# Patient Record
Sex: Female | Born: 1945 | Race: Black or African American | Hispanic: No | Marital: Single | Smoking: Former smoker
Health system: Southern US, Community
[De-identification: ages and names within clinical notes are randomized; demographics above are authoritative.]

## PROBLEM LIST (undated history)

## (undated) DIAGNOSIS — S065X9A Traumatic subdural hemorrhage with loss of consciousness of unspecified duration, initial encounter: Secondary | ICD-10-CM

## (undated) DIAGNOSIS — J9690 Respiratory failure, unspecified, unspecified whether with hypoxia or hypercapnia: Secondary | ICD-10-CM

## (undated) DIAGNOSIS — D649 Anemia, unspecified: Secondary | ICD-10-CM

## (undated) DIAGNOSIS — H269 Unspecified cataract: Secondary | ICD-10-CM

## (undated) DIAGNOSIS — N186 End stage renal disease: Secondary | ICD-10-CM

## (undated) DIAGNOSIS — IMO0002 Reserved for concepts with insufficient information to code with codable children: Secondary | ICD-10-CM

## (undated) DIAGNOSIS — B9562 Methicillin resistant Staphylococcus aureus infection as the cause of diseases classified elsewhere: Secondary | ICD-10-CM

## (undated) DIAGNOSIS — I472 Ventricular tachycardia, unspecified: Secondary | ICD-10-CM

## (undated) DIAGNOSIS — I10 Essential (primary) hypertension: Secondary | ICD-10-CM

## (undated) DIAGNOSIS — Z1635 Resistance to multiple antimicrobial drugs: Secondary | ICD-10-CM

## (undated) DIAGNOSIS — R7881 Bacteremia: Secondary | ICD-10-CM

## (undated) DIAGNOSIS — S065XAA Traumatic subdural hemorrhage with loss of consciousness status unknown, initial encounter: Secondary | ICD-10-CM

## (undated) DIAGNOSIS — A0472 Enterocolitis due to Clostridium difficile, not specified as recurrent: Secondary | ICD-10-CM

## (undated) DIAGNOSIS — I639 Cerebral infarction, unspecified: Secondary | ICD-10-CM

## (undated) DIAGNOSIS — N2581 Secondary hyperparathyroidism of renal origin: Secondary | ICD-10-CM

## (undated) DIAGNOSIS — G40909 Epilepsy, unspecified, not intractable, without status epilepticus: Secondary | ICD-10-CM

## (undated) DIAGNOSIS — A4902 Methicillin resistant Staphylococcus aureus infection, unspecified site: Secondary | ICD-10-CM

## (undated) DIAGNOSIS — G4731 Primary central sleep apnea: Secondary | ICD-10-CM

## (undated) HISTORY — PX: THYROIDECTOMY: SHX17

## (undated) HISTORY — PX: VASCULAR SURGERY: SHX849

## (undated) HISTORY — PX: CRANIOTOMY: SHX93

## (undated) HISTORY — PX: CHOLECYSTECTOMY: SHX55

## (undated) HISTORY — PX: EYE SURGERY: SHX253

---

## 2007-08-13 ENCOUNTER — Ambulatory Visit (HOSPITAL_COMMUNITY): Admission: RE | Admit: 2007-08-13 | Discharge: 2007-08-13 | Payer: Self-pay | Admitting: Nephrology

## 2007-09-13 ENCOUNTER — Inpatient Hospital Stay (HOSPITAL_COMMUNITY): Admission: EM | Admit: 2007-09-13 | Discharge: 2007-09-14 | Payer: Self-pay | Admitting: Emergency Medicine

## 2007-09-14 ENCOUNTER — Encounter: Payer: Self-pay | Admitting: Vascular Surgery

## 2007-09-14 ENCOUNTER — Ambulatory Visit: Payer: Self-pay | Admitting: Vascular Surgery

## 2007-09-19 ENCOUNTER — Ambulatory Visit: Payer: Self-pay | Admitting: Vascular Surgery

## 2007-09-26 ENCOUNTER — Ambulatory Visit: Payer: Self-pay | Admitting: Vascular Surgery

## 2007-10-30 ENCOUNTER — Emergency Department (HOSPITAL_COMMUNITY): Admission: EM | Admit: 2007-10-30 | Discharge: 2007-10-30 | Payer: Self-pay | Admitting: Emergency Medicine

## 2007-11-03 ENCOUNTER — Ambulatory Visit: Payer: Self-pay | Admitting: Surgery

## 2007-11-13 ENCOUNTER — Ambulatory Visit (HOSPITAL_COMMUNITY): Admission: RE | Admit: 2007-11-13 | Discharge: 2007-11-13 | Payer: Self-pay | Admitting: Surgery

## 2007-11-13 ENCOUNTER — Ambulatory Visit: Payer: Self-pay | Admitting: Surgery

## 2007-11-16 ENCOUNTER — Ambulatory Visit (HOSPITAL_COMMUNITY): Admission: EM | Admit: 2007-11-16 | Discharge: 2007-11-16 | Payer: Self-pay | Admitting: Emergency Medicine

## 2007-12-03 ENCOUNTER — Ambulatory Visit (HOSPITAL_COMMUNITY): Admission: RE | Admit: 2007-12-03 | Discharge: 2007-12-03 | Payer: Self-pay | Admitting: Vascular Surgery

## 2007-12-05 ENCOUNTER — Other Ambulatory Visit: Payer: Self-pay | Admitting: Emergency Medicine

## 2007-12-06 ENCOUNTER — Ambulatory Visit (HOSPITAL_COMMUNITY): Admission: RE | Admit: 2007-12-06 | Discharge: 2007-12-06 | Payer: Self-pay | Admitting: Vascular Surgery

## 2007-12-10 ENCOUNTER — Ambulatory Visit: Payer: Self-pay | Admitting: Vascular Surgery

## 2007-12-29 ENCOUNTER — Ambulatory Visit: Payer: Self-pay | Admitting: Surgery

## 2008-01-16 ENCOUNTER — Ambulatory Visit: Payer: Self-pay | Admitting: Surgery

## 2008-01-16 ENCOUNTER — Inpatient Hospital Stay (HOSPITAL_COMMUNITY): Admission: RE | Admit: 2008-01-16 | Discharge: 2008-01-17 | Payer: Self-pay | Admitting: Surgery

## 2008-01-30 ENCOUNTER — Ambulatory Visit: Payer: Self-pay | Admitting: Vascular Surgery

## 2008-03-26 ENCOUNTER — Ambulatory Visit (HOSPITAL_COMMUNITY): Admission: RE | Admit: 2008-03-26 | Discharge: 2008-03-26 | Payer: Self-pay | Admitting: Vascular Surgery

## 2008-03-26 ENCOUNTER — Ambulatory Visit: Payer: Self-pay | Admitting: Surgery

## 2009-01-19 ENCOUNTER — Ambulatory Visit (HOSPITAL_COMMUNITY): Admission: RE | Admit: 2009-01-19 | Discharge: 2009-01-19 | Payer: Self-pay | Admitting: Nephrology

## 2009-04-18 ENCOUNTER — Ambulatory Visit (HOSPITAL_COMMUNITY): Admission: RE | Admit: 2009-04-18 | Discharge: 2009-04-18 | Payer: Self-pay | Admitting: Nephrology

## 2009-07-12 IMAGING — XA IR PTA VENOUS
1 series · 12 of 24 positions shown · non-contrast
Comparison: No prior imaging studies for comparison.

CLINICAL DATA: Endstage renal disease.  The patient is dialyzed through a straight dialysis graft in the left upper arm.  She has developed significant left upper extremity edema and pain at the site of the dialysis graft. 
LEFT UPPER EXTREMITY DIALYSIS GRAFT AV SHUNTOGRAM:
CENTRAL VENOUS ANGIOPLASTY OF LEFT BRACHIOCEPHALIC VEIN ? 08/13/07:

[Series 1: run · 12 of 58 slices shown]
[im 3/58]
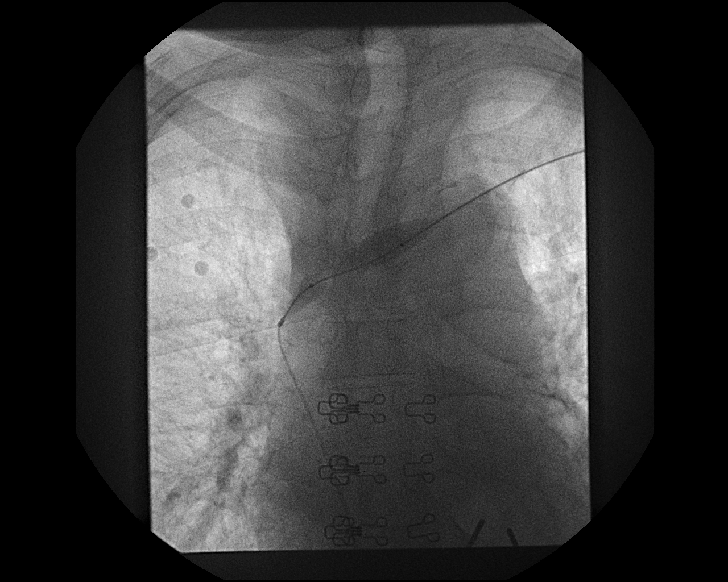
[im 8/58]
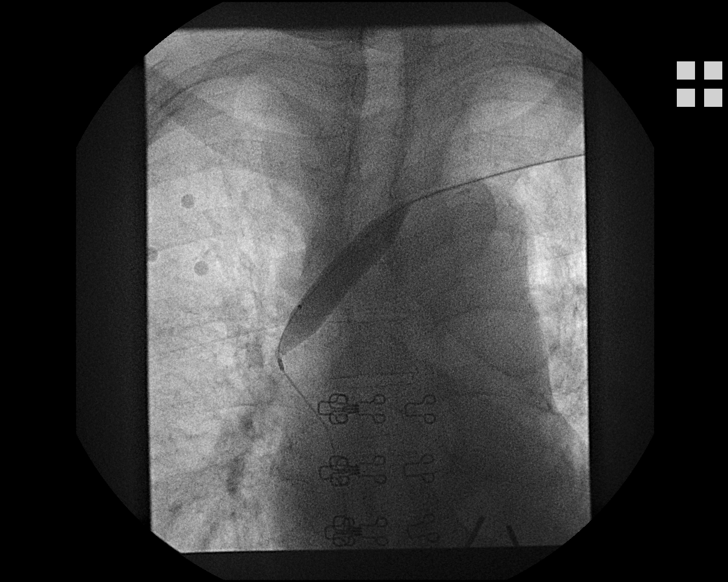
[im 13/58]
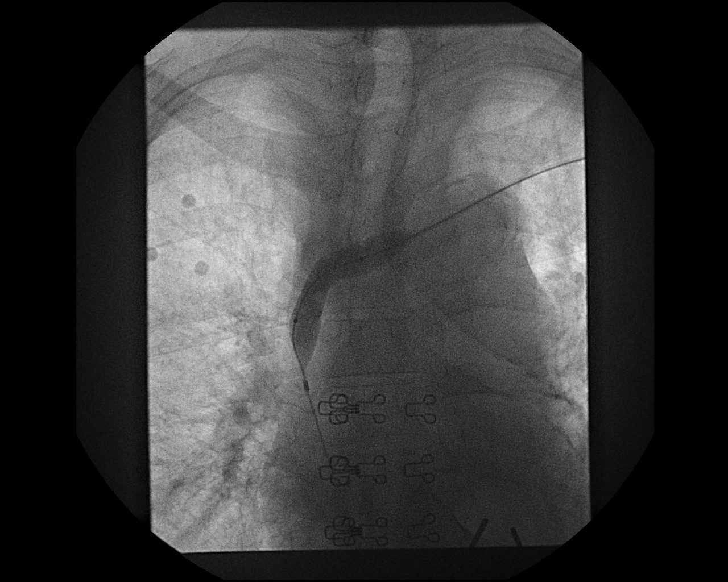
[im 18/58]
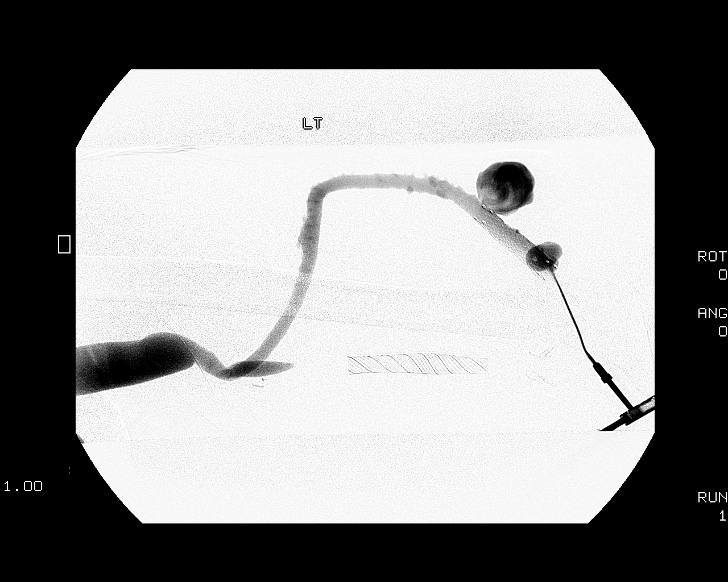
[im 23/58]
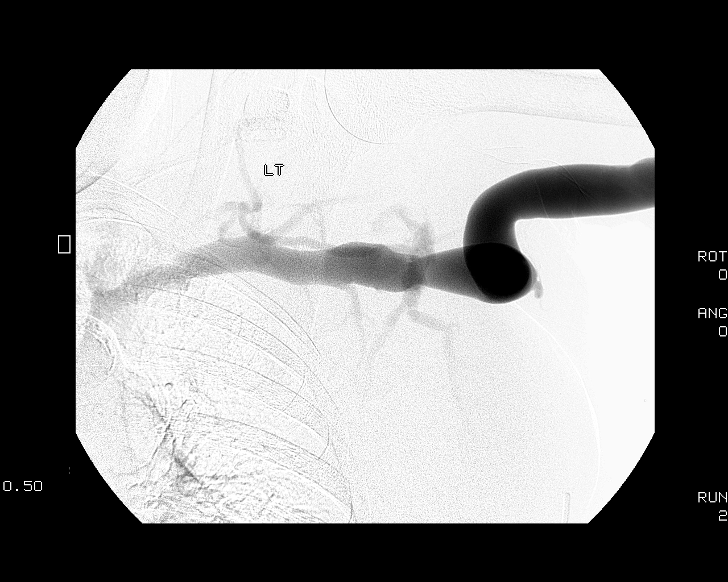
[im 28/58]
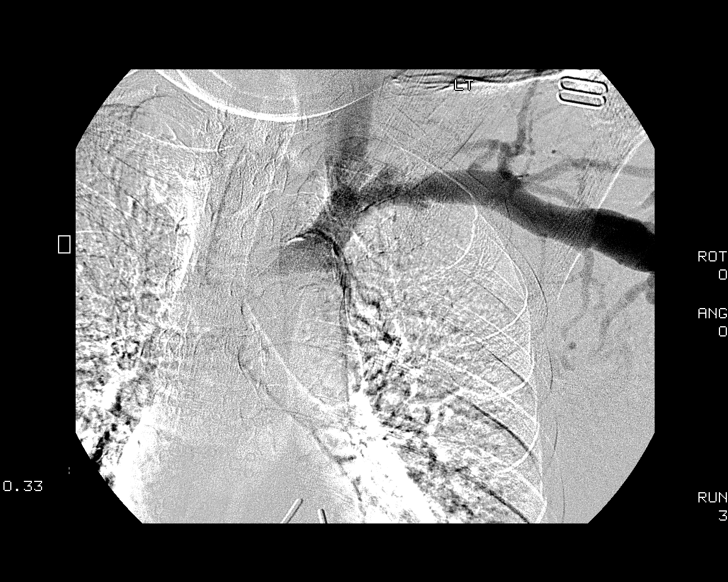
[im 33/58]
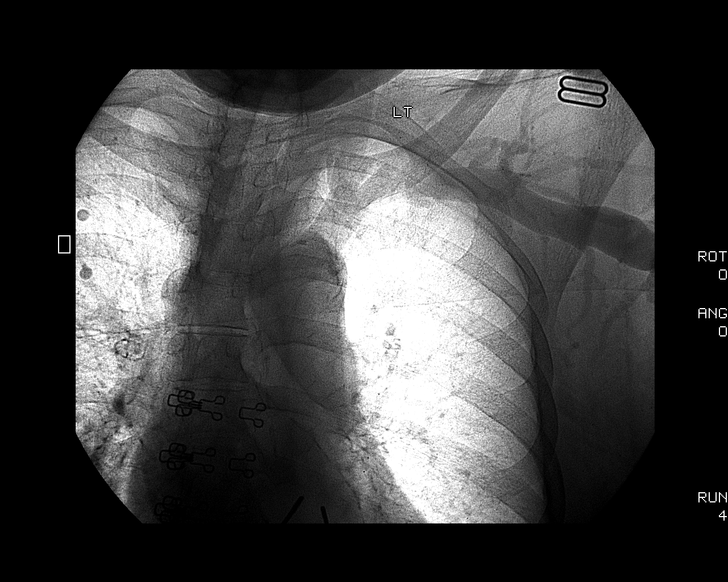
[im 38/58]
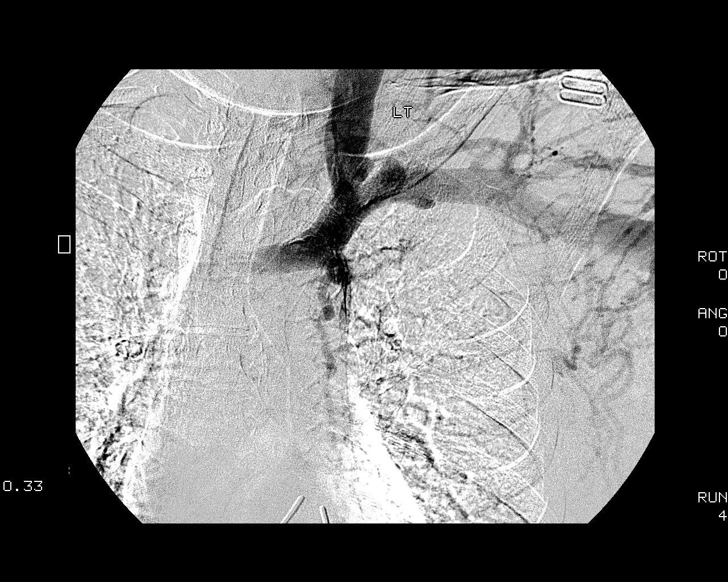
[im 43/58]
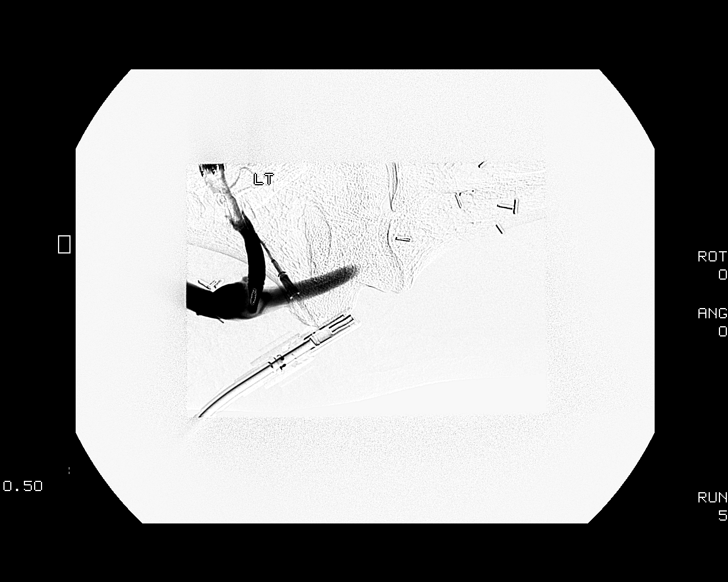
[im 48/58]
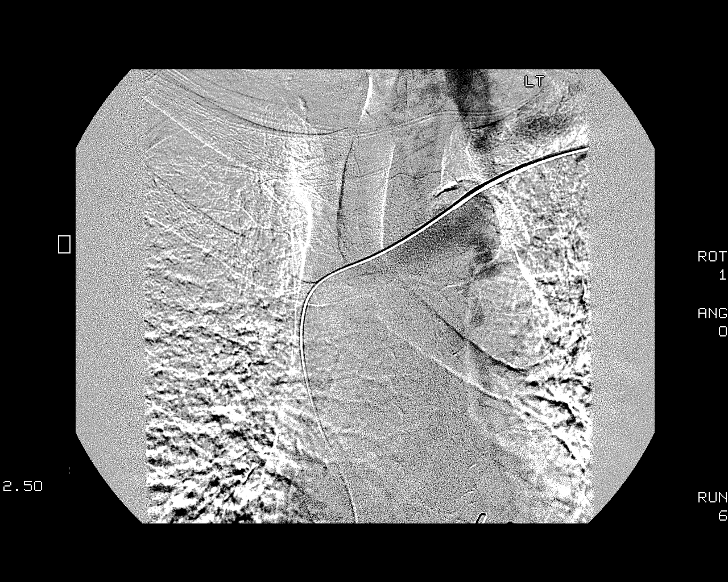
[im 53/58]
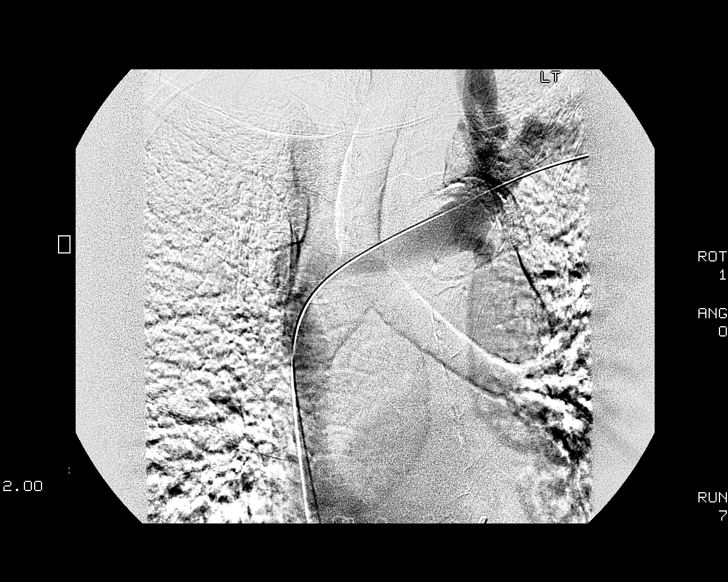
[im 58/58]
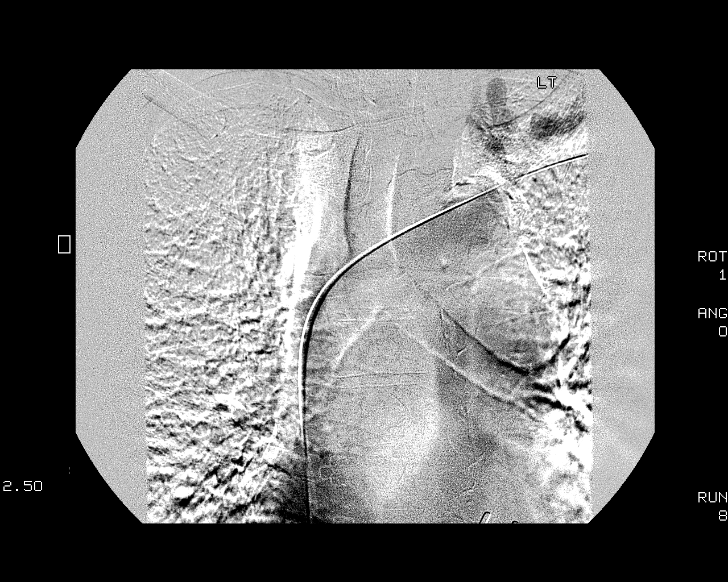

[12 of 24 positions shown; findings below may reference images not displayed]

Prior to the procedure, informed consent was obtained. 
Contrast:  95 cc Omnipaque 300. 
Fluoro time:  7.2 minutes. 
Medications:  0222 units intravenous heparin.
The skin overlying the left upper arm graft was sterilely prepped and draped.  Utilizing sterile technique, an 18-gauge angiocath was introduced.  Contrast shuntogram was performed with full visualization of venous outflow into the chest.  The arterial anastomosis was studied during temporary compression of the graft in the upper arm. 
After the diagnostic procedure, the angiocath was removed over a guidewire.  A 7-French vascular sheath was placed.  A diagnostic catheter was advanced into the chest.  A guidewire was then advanced through the brachiocephalic venous segment and into the SVC.  Balloon angioplasty was performed across the left brachiocephalic vein initially with a 12-mm x 4-cm Atlas balloon.  This was followed by additional angiography.
Additional balloon angioplasty was then performed with a 14-mm x 4-cm Atlas balloon.  A final angiography was then performed through the sheath. 
After the procedure, the sheath was removed and hemostasis obtained with application of an Ethilon pursestring suture. 
Complications:  None.
FINDINGS: The graft in the upper arm is patent.  After a normally patent arterial anastomosis, a short-segment self-expanded stent is present within the graft itself.  There is contrast filling of two focal pseudoaneurysms along the lower portion of the graft.  Venous anastomosis of the graft in the upper arm is widely patent. 
There is diffuse enlargement and tortuosity of the basilic vein in the upper arm as well as the axillary vein.  A number of collateral veins are seen emanating from the axillary vein and also traversing the chest.  Contrast initially flows in a retrograde fashion up the left internal jugular vein.  There is a level of occlusion of flow at the mid chest in the central portion of the left brachiocephalic vein; this had a subacute/subtotal appearance by initial angiography. 
Brachiocephalic venous occlusion was able to be crossed with a guidewire.  Balloon dilatation initially with the 12-mm balloon showed improved result with residual 50-60% stenosis remaining as well as some persistent reflux of contrast into the left jugular vein and collateral veins.
After further 14-mm balloon angioplasty, there was further improvement.  Final estimated central brachiocephalic vein stenosis is approximately 25-30%.  The SVC is normally patent.
IMPRESSION: The shunt study reveals a subacute occlusion of the left brachiocephalic vein in the midline.  This was able to be crossed and recanalized with the stenosis responding to both 12-mm and 14-mm angioplasty.  A mild residual 25-30% stenosis remains at the level of the left brachiocephalic vein.

## 2009-09-19 ENCOUNTER — Ambulatory Visit (HOSPITAL_COMMUNITY): Admission: RE | Admit: 2009-09-19 | Discharge: 2009-09-19 | Payer: Self-pay | Admitting: Nephrology

## 2009-09-28 IMAGING — CR DG CHEST 2V
2 series · 2 of 2 positions shown · non-contrast
Comparison: 09/13/2007.

CHEST - 2 VIEW
CLINICAL DATA: Nosebleed, pneumonia, short of breath.

[w chest pa]
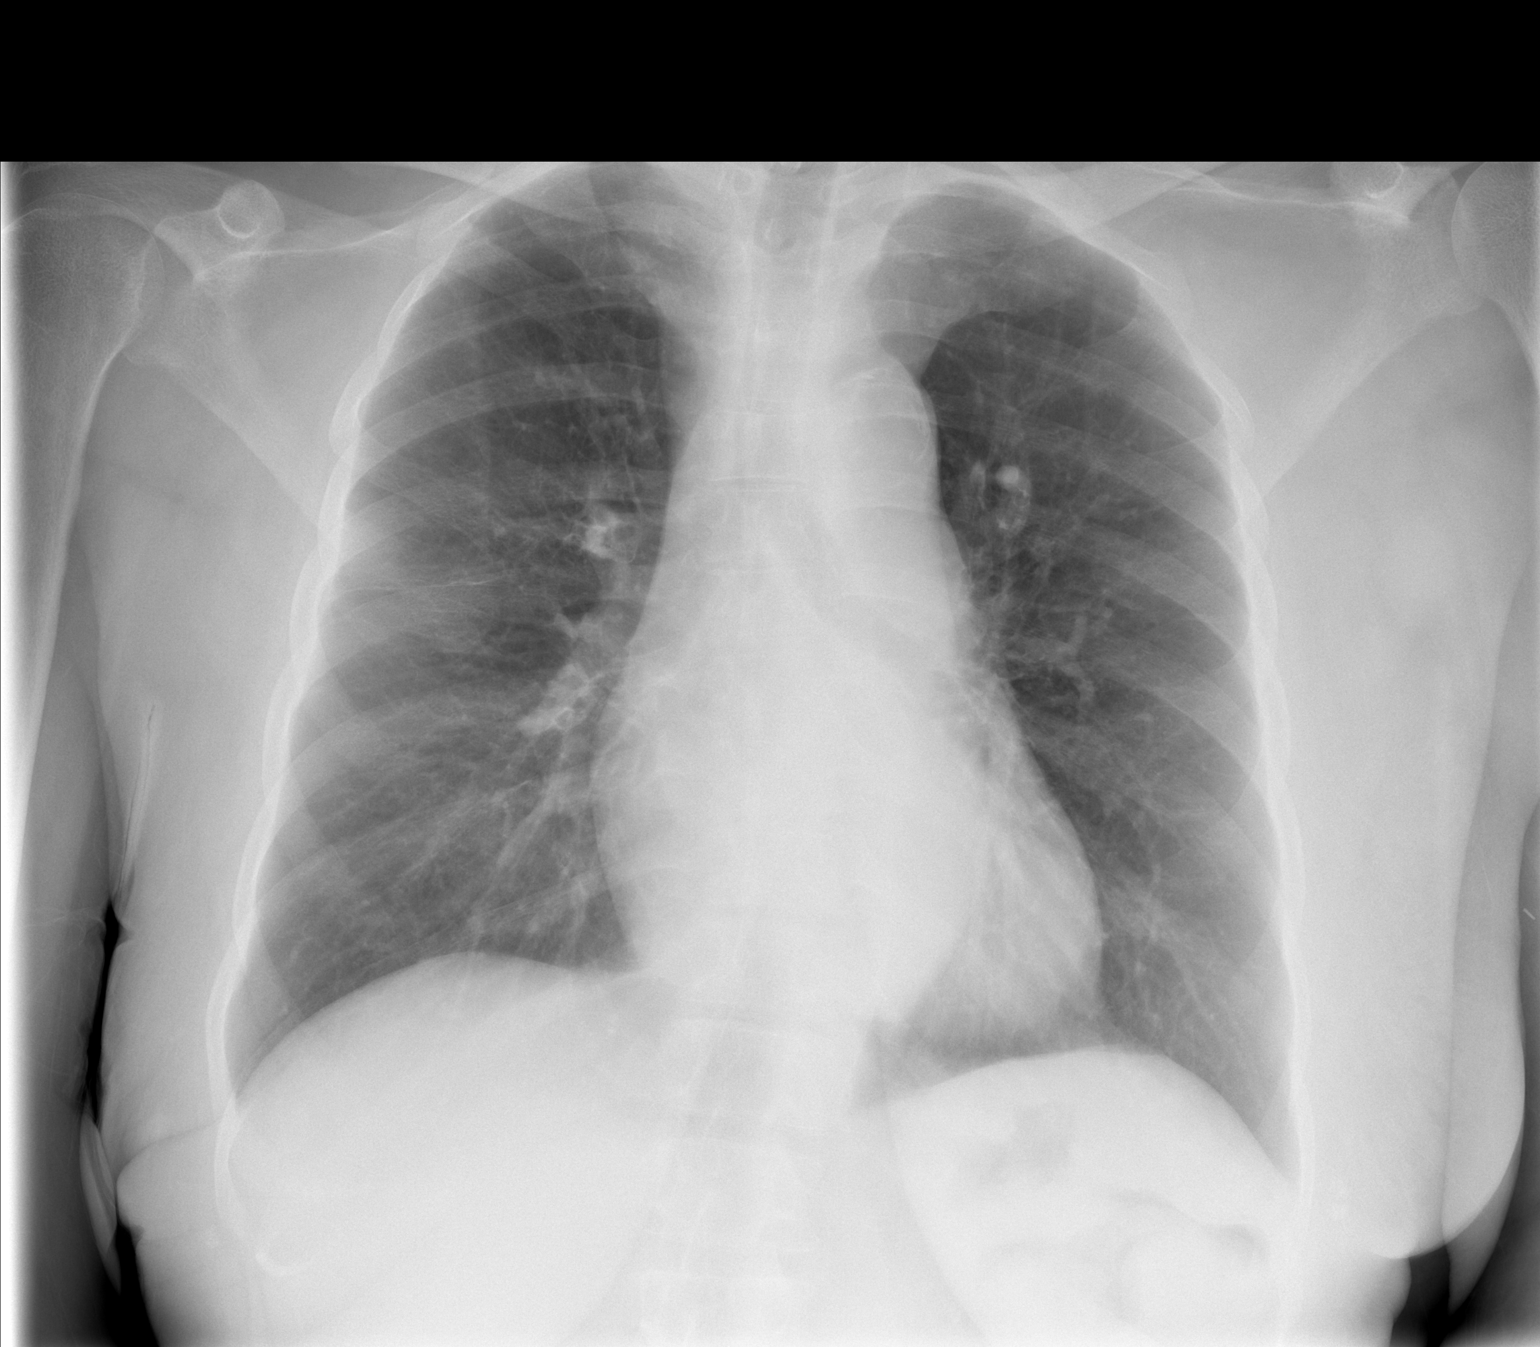

[w chest lat]
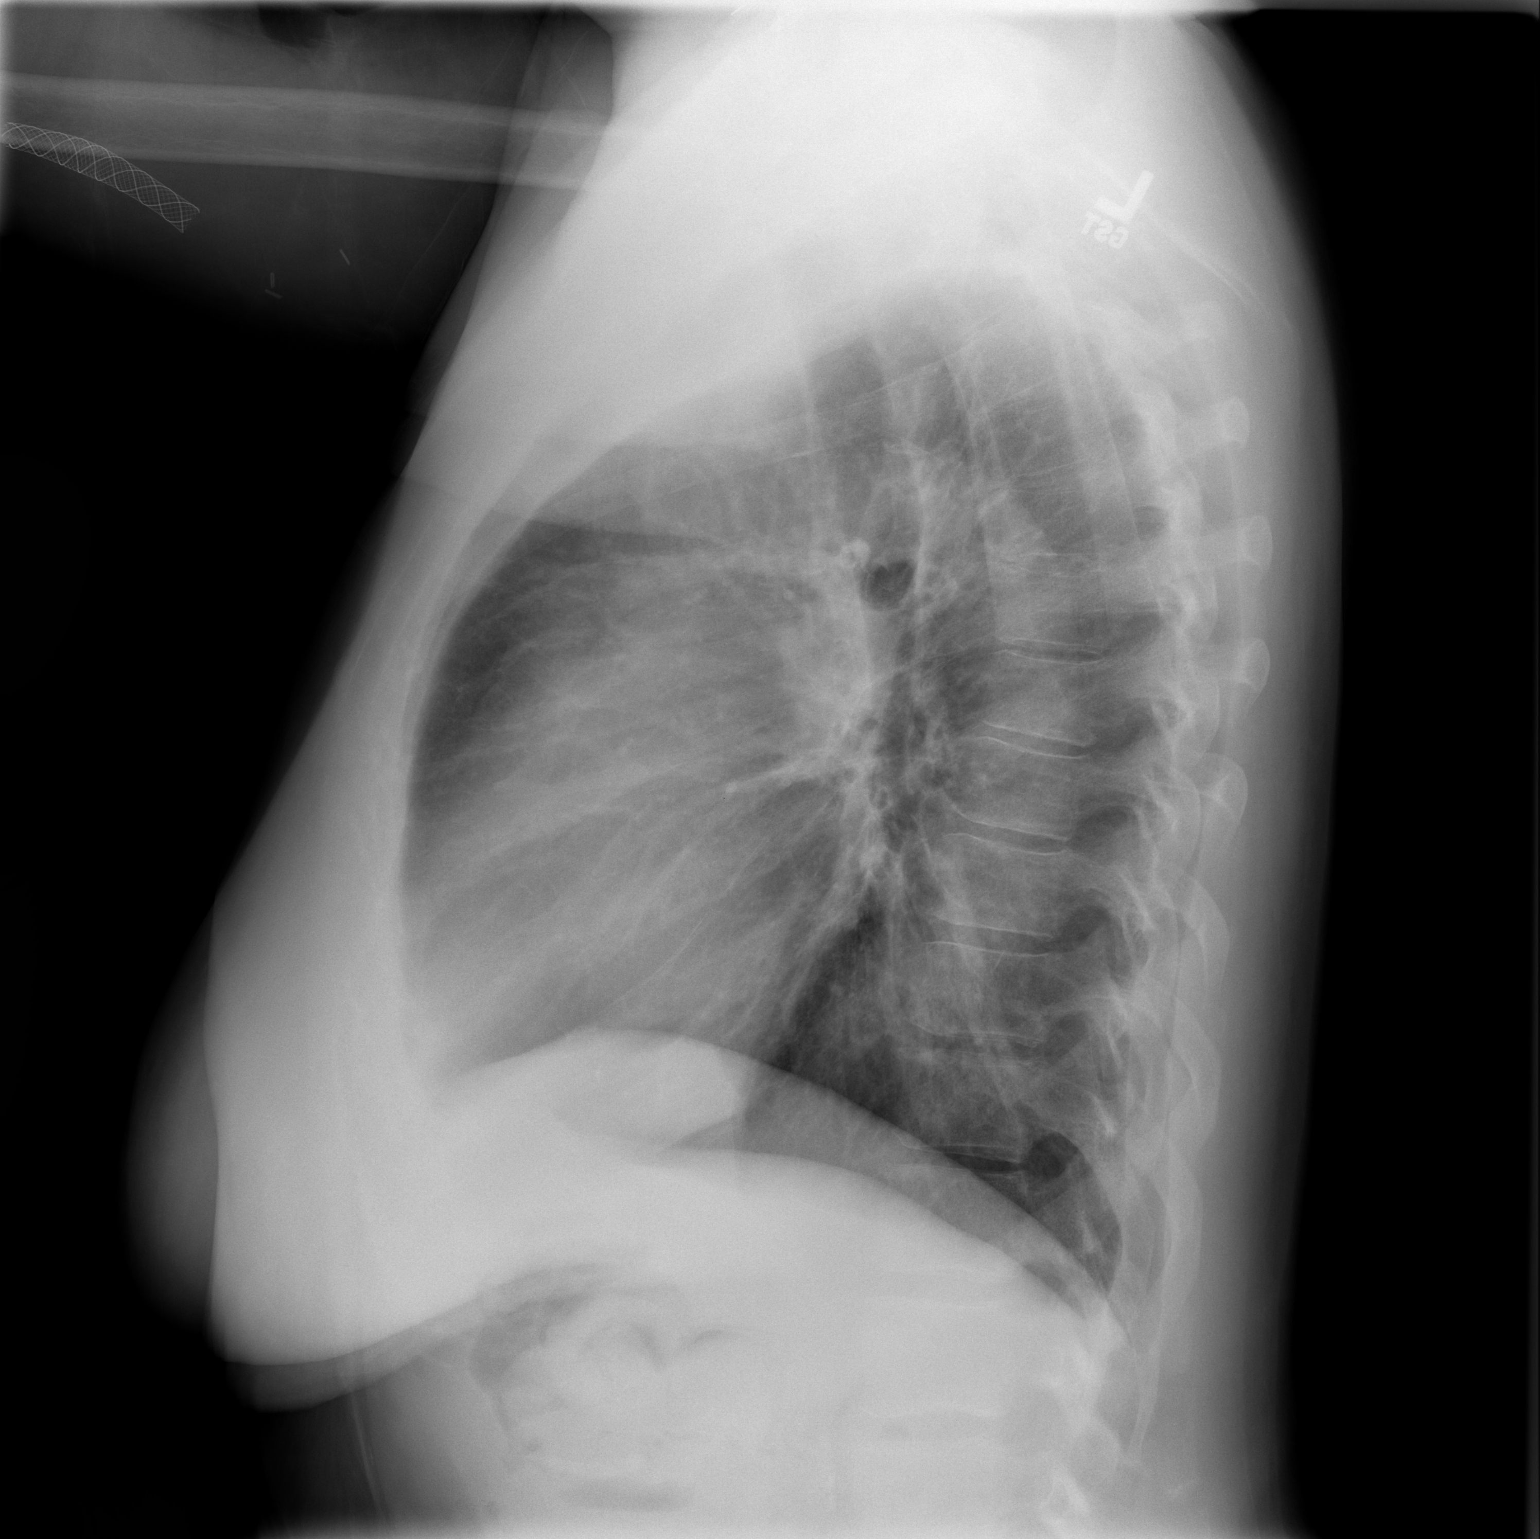

[2 of 2 positions shown; findings below may reference images not displayed]

FINDINGS: Cardiomediastinal contours within normal limits and unchanged. Aortic
arch atherosclerosis. No airspace disease or edema identified. No pleural
effusions.
**********************************************************************
IMPRESSION

1. No acute cardiopulmonary disease. Stable appearance of the chest. 
**********************************************************************

## 2010-11-22 ENCOUNTER — Ambulatory Visit (HOSPITAL_COMMUNITY)
Admission: RE | Admit: 2010-11-22 | Discharge: 2010-11-22 | Payer: Self-pay | Source: Home / Self Care | Attending: Nephrology | Admitting: Nephrology

## 2011-03-16 ENCOUNTER — Ambulatory Visit (INDEPENDENT_AMBULATORY_CARE_PROVIDER_SITE_OTHER): Payer: Medicare Other

## 2011-03-16 DIAGNOSIS — I724 Aneurysm of artery of lower extremity: Secondary | ICD-10-CM

## 2011-03-16 NOTE — H&P (Signed)
HISTORY AND PHYSICAL EXAMINATION  March 16, 2011  Re:  [name], [name]             DOB:  [birth year]  HISTORY OF PRESENT ILLNESS:  The patient is [name] 65 year old African American female with end-stage renal disease who dialyzes on Tuesday, Thursday, Saturday who presents for evaluation of possible pseudoaneurysm on her right thigh graft.  The patient was referred by Dr. Darrick Penna, her nephrologist.  She states that she began to notice [name] "lump" on her graft approximately 3 weeks ago.  She states this has enlarged in size over this time period.  Secondary to this Dr. Darrick Penna wished to have her evaluated.  They have continued to utilize the graft sticking it in other places.  Past medical history is significant for diabetes mellitus, hypertension and end-stage renal disease.  PAST MEDICAL HISTORY: 1. Type 2 diabetes mellitus noninsulin dependent. 2. Cerebrovascular accident x2. 3. End-stage renal disease. 4. Hypertension.  ALLERGIES:  Penicillin which causes hives and dizziness.  MEDICATIONS:  The patient did not bring [name] list of her medicines with her.  SOCIAL HISTORY:  The patient is single with 2 children.  She does not smoke and quit in 1996.  She does not drink alcohol.  FAMILY HISTORY: 1. Hypertension. 2. Diabetes mellitus.  REVIEW OF SYSTEMS:  The patient complains of some pain in her legs with walking and she is currently in [name] wheelchair.  She complains of occasional constipation and occasional headaches.  Complete review of systems is otherwise negative.  PHYSICAL EXAM:  General:  The patient is well-nourished, in no acute distress.  Vital signs:  O2 saturations 100% on room air, heart rate 78, respiratory rate is 12.  HEENT:  PERRLA.  EOMI.  Normocephalic, atraumatic.  Lungs:  Clear to auscultation.  Cardiovascular:  Regular rate and rhythm.  Abdomen:  Soft, nontender with active bowel sounds. Musculoskeletal:  No major deformities or cyanosis.   There is [name] right thigh graft in place with an excellent thrill.  There is also [name] pseudoaneurysm noted on the lateral portion of the graft near the proximal thigh.  This is pulsatile.  There is no evidence of skin breakdown or thinning.  The bilateral lower extremities are warm. Neurological:  No focal weakness or paresthesias.  Skin:  No ulcers or rashes are noted.  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  PLAN:  The patient was evaluated also by Dr. Imogene Burn who agrees the pseudoaneurysm should be resected.  The patient will be scheduled for revision of her right thigh AV graft with interposition graft on 03/28/2011 by Dr. Myra Gianotti.  The patient understands the procedure and agrees to proceed.  The patient and her daughter are informed that if she should have any complications including bleeding prior to scheduled procedure she is to:  Apply pressure directly over the site of bleeding consistently and call 911 and/or present to the emergency room for emergent evaluation.  The patient and daughter voiced understanding.  Pecola Leisure, [place]  Fransisco Hertz, MD Electronically Signed  AY/MEDQ  D:  03/16/2011  T:  03/16/2011  Job:  [id number]

## 2011-03-28 ENCOUNTER — Inpatient Hospital Stay (HOSPITAL_COMMUNITY): Admission: RE | Admit: 2011-03-28 | Payer: Medicare Other | Source: Ambulatory Visit | Admitting: Surgery

## 2011-03-30 ENCOUNTER — Inpatient Hospital Stay (HOSPITAL_COMMUNITY): Payer: Medicare Other

## 2011-03-30 ENCOUNTER — Inpatient Hospital Stay (HOSPITAL_COMMUNITY)
Admission: RE | Admit: 2011-03-30 | Discharge: 2011-03-30 | DRG: 264 | Disposition: A | Discharge status: Home / Self Care | Payer: Medicare Other | Source: Ambulatory Visit | Attending: Vascular Surgery | Admitting: Vascular Surgery

## 2011-03-30 DIAGNOSIS — I120 Hypertensive chronic kidney disease with stage 5 chronic kidney disease or end stage renal disease: Secondary | ICD-10-CM | POA: Diagnosis present

## 2011-03-30 DIAGNOSIS — N186 End stage renal disease: Secondary | ICD-10-CM | POA: Diagnosis present

## 2011-03-30 DIAGNOSIS — Z992 Dependence on renal dialysis: Secondary | ICD-10-CM

## 2011-03-30 DIAGNOSIS — Z8673 Personal history of transient ischemic attack (TIA), and cerebral infarction without residual deficits: Secondary | ICD-10-CM

## 2011-03-30 DIAGNOSIS — Y841 Kidney dialysis as the cause of abnormal reaction of the patient, or of later complication, without mention of misadventure at the time of the procedure: Secondary | ICD-10-CM | POA: Diagnosis present

## 2011-03-30 DIAGNOSIS — Z87891 Personal history of nicotine dependence: Secondary | ICD-10-CM

## 2011-03-30 DIAGNOSIS — T82898A Other specified complication of vascular prosthetic devices, implants and grafts, initial encounter: Secondary | ICD-10-CM

## 2011-03-30 LAB — POCT I-STAT 4, (NA,K, GLUC, HGB,HCT)
HCT: 39 % (ref 36.0–46.0)
Hemoglobin: 13.3 g/dL (ref 12.0–15.0)

## 2011-03-30 LAB — SURGICAL PCR SCREEN
MRSA, PCR: NEGATIVE
Staphylococcus aureus: NEGATIVE

## 2011-04-03 NOTE — Op Note (Signed)
  NAMEJERIS, [name]             ACCOUNT NO.:  [id number]  MEDICAL RECORD NO.:  [id number]           PATIENT TYPE:  I  LOCATION:  2899                         FACILITY:  [facility]  PHYSICIAN:  Di Kindle. Edilia Bo, M.D.DATE OF BIRTH:  04-30-1946  DATE OF PROCEDURE:  03/30/2011 DATE OF DISCHARGE:  03/30/2011                              OPERATIVE REPORT   PREOPERATIVE DIAGNOSIS:  Aneurysm of right thigh arteriovenous graft.  POSTOPERATIVE DIAGNOSIS:  Aneurysm of right thigh arteriovenous graft.  PROCEDURE:  Thrombectomy and revision of right thigh AV graft.  SURGEON:  Di Kindle. Edilia Bo, MD.  ASSISTANT:  Newton Pigg, PA.  ANESTHESIA:  General.  INDICATIONS:  This is a 65 year old woman who had an aneurysm along the arterial half of her graft.  She was brought in for revision given the risk of rupture.  DESCRIPTION OF PROCEDURE:  The patient received a general anesthetic. The right thigh was prepped and draped in usual sterile fashion.  I elected to bypass around the segment with the aneurysm and tunneled the graft medial to the old graft in order to allow easier access.  An oblique incision was made at the distal loop of the graft and then at the proximal arterial end of the graft and the graft at both ends was dissected free.  The patient was then heparinized after a tunnel was created between the two incisions and a 7-mm PTFE graft tunneled between the two incisions.  The graft was then clamped at both ends and divided. I passed a #4 Fogarty catheter in both directions.  A small amount organized clot was retrieved.  The old graft was then flushed with heparinized saline and clamped.  Both ends of the graft was spatulated and sewn end-to-end to the old graft, which was spatulated using continuous 6-0 Prolene suture.  At the completion, there was an excellent thrill in the graft.  Heparin was partially reversed with protamine.  The wounds were closed with deep  layer of 3-0 Vicryl and the skin closed with 4-0 Vicryl after hemostasis was obtained.  Sterile dressing was applied.  The patient tolerated the procedure well, was transferred to the recovery room in stable condition.  All needle and sponge counts were correct.     Di Kindle. Edilia Bo, M.D.     CSD/MEDQ  D:  03/30/2011  T:  03/31/2011  Job:  [id number]  Electronically Signed by Waverly Ferrari M.D. on 04/03/2011 04:52:57 PM

## 2011-04-10 NOTE — Assessment & Plan Note (Signed)
OFFICE VISIT   [name], [name]  DOB:  30-Dec-1945                                       11/03/2007  CHART#:[id number]   REASON FOR CONSULTATION:  Left arm swelling.   HISTORY:  This is a 65 year old African-American female with end-stage  renal disease secondary to presumably hypertension who has been on  dialysis for less than 3 years.  She has had most of her care in [place].  However, has recently moved to [place].  She comes in today with a 1  to 2 week history of face and left breast swelling.  She underwent  percutaneous intervention in September, which involved a balloon  angioplasty of her innominate vein using a 12-14 mm balloon.  She did  well initially.  However, has had recurrence.  She has been dialyzing  without difficulty.  She has been maintained on Coumadin reportedly to  keep her graft open.   REVIEW OF SYSTEMS:  GENERAL:  Has no weight gain or weight loss.  CARDIAC:  Positive for shortness of breath with exertion.  Negative for  chest pain.  PULMONARY:  Negative.  GASTROINTESTINAL:  Negative.  GENITOURINARY:  Kidney disease.  End-stage renal disease.  VASCULAR:  History of a stroke.  NEURO:  Negative.  ORTHO:  Negative.  PSYCH:  Negative.  ENT:  Poor eyesight, unable to be corrected by glasses.  HEMATOLOGICAL:  She is on Coumadin.   PAST MEDICAL HISTORY:  Positive for diabetes, which is diet-controlled,  and hypertension.  Stroke.   PAST SURGICAL HISTORY:  Cholecystectomy.  Cyst removal.  Left upper arm  graft and revision.   FAMILY HISTORY:  Positive for hypertension.   SOCIAL HISTORY:  Does not smoke.  Has a history of smoking.  Quit 6  years ago.  Does not drink.   MEDICATIONS:  Coumadin.  Lisinopril 20 mg twice daily.  Procardia 60 mg  twice daily.  Labetalol 300 mg twice daily.  Renagel 800 mg 3 with each  meal.  Multivitamin.  Reglan.   ALLERGIES:  Penicillin.   PHYSICAL EXAMINATION:  Vital signs:  Blood pressure  165/100, pulse 69,  respirations 18.  Cardiovascular is regular rate and rhythm.  In  general, she is well-appearing, in no acute distress in the wheelchair.  Left arm has palpable radial pulse.  There is a good thrill within the  graft.  Pulmonary, lungs are clear.  HEENT:  She is normocephalic,  atraumatic.  There is slight prominence in the neck and breast region  with engorgement.  No prominent collaterals are visualized.  Abdomen is  soft.  Extremities are warm and well-perfused.   DIAGNOSTIC STUDIES:  I reviewed her graft study from September.  She has  multiple stents within her graft, which is patent.  There is innominate  vein occlusion, which was successfully crossed and treated with  percutaneous balloon venoplasty.   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  Plan:  The patient's symptoms have returned  and for that reason, I feel that we should proceed with repeat graft  study with likely intervention of her innominate vein.  I discussed with  the family that should we find occlusion of her innominate vein and if  we are unable to cross this lesion and successfully recannulize it, we  would be looking  at ligation of her graft to control her symptoms.  If,  at the time of her procedure, I find that I am unable to open her  innominate vein, I would also perform a right-sided central venogram to  evaluate her for a new access on the right side.  Would also need to  discuss temporary placement of a catheter.  She has been told in the  past in [place] that she is not a candidate for a catheter in her neck  any more, and I want to find out the etiology of this.  This may also  impact where we locate her next graft.  I have scheduled her for a graft  study on Thursday, December 18.  She needs to a first case as she  dialyzes on Thursday at 12 noon.   Jorge Ny, MD  Electronically Signed   VWB/MEDQ  D:  11/03/2007  T:   11/04/2007  Job:  [id number]   cc:   Fayrene Fearing L. Deterding, M.D.

## 2011-04-10 NOTE — Op Note (Signed)
NAMESHARISA, [name]             ACCOUNT NO.:  [id number]   MEDICAL RECORD NO.:  [id number]          PATIENT TYPE:  AMB   LOCATION:  SDS                          FACILITY:  [facility]   PHYSICIAN:  Juleen China IV, MDDATE OF BIRTH:  October 23, 1946   DATE OF PROCEDURE:  11/13/2007  DATE OF DISCHARGE:  11/13/2007                               OPERATIVE REPORT   SURGEON:  1. Charlena Cross, M.D.   PREOPERATIVE DIAGNOSIS:  Central stenosis.   POSTOPERATIVE DIAGNOSIS:  Central stenosis.   PROCEDURE PERFORMED:  1. Ultrasound-guided venous access.  2. Dialysis graft study.  3. Angioplasty, innominate vein.   ANESTHESIA:  Local.   BLOOD LOSS:  Minimal.   FINDINGS:  Innominate vein stenosis treated with a 12 x 4 and 14 x 4  Atlas balloon.   HISTORY:  This is a 65 year old female who had dialyzed through her left  upper arm graft who presented with neck and left breast swelling.  She  has a history of innominate vein occlusion, which had been treated with  venoplasty in the past.  She comes in for diagnostic study.  Risks and  benefits were discussed and informed consent was signed.   PROCEDURE:  The patient was identified in the holding area and taken to  room #7.  She was placed supine on the table.  The left arm was prepped  and draped in standard sterile fashion; 1% lidocaine was used for local  anesthesia.  The left dialysis graft was evaluated with ultrasound.  This was found to be widely patent.  Using a micropuncture needle the  graft was accessed under ultrasound guidance.  The wire was advanced  centrally.  The micropuncture sheath was then placed.  Micropuncture  sheath contrast injections were performed including central venous  study.  This revealed a widely patent graft.  A stenosis within the  innominate vein was visualized.  Next, a Rosen wire was placed to the  micropuncture sheath, which was then removed and a 7-French short sheath  was then placed.  Over the  wire, a 12 x 4 Atlas balloon was advanced  across the lesion.  A Kumpe catheter was then used to direct the Rosen  wire into the heart.  The 12 x 4 balloon was then used to perform  balloon angioplasty going up to almost 20 atmospheres.  Followup study  revealed improvement, but residual stenosis.  Therefore, a 14 x 4  balloon was used to perform central venoplasty in the innominate vein.  This was taken to 20 atmospheres.  Followup study revealed  significant improvement in innominate vein stenosis.  At this point in  time, a decision was made to terminate the procedure.  The access site  was closed using the __________  technique.  The patient tolerated the  procedure well.  There were no complications.           ______________________________  V. Charlena Cross, MD  Electronically Signed     VWB/MEDQ  D:  11/13/2007  T:  11/13/2007  Job:  [id number]

## 2011-04-10 NOTE — Assessment & Plan Note (Signed)
OFFICE VISIT   [name], [name]  DOB:  January 12, 1946                                       09/26/2007  CHART#:[id number]   REASON FOR VISIT:  [name] presents today for continued followup  of her left AV graft.   HISTORY:  She had removal of an acute large false aneurysm with  replacement of interposition graft through this.  She has continued to  be dialyzed adequately through the upper portion of the upper arm graft.  Her incision is healing and her sutures were removed today.  She will  continue to use the upper portion of her graft and will see [place] again on  as needed basis.   Larina Earthly, M.D.  Electronically Signed   TFE/MEDQ  D:  09/26/2007  T:  09/30/2007  Job:  [id number]   cc:   [facility]

## 2011-04-10 NOTE — Discharge Summary (Signed)
[name], [name]             ACCOUNT NO.:  [id number]   MEDICAL RECORD NO.:  [id number]          PATIENT TYPE:  INP   LOCATION:  6736                         FACILITY:  [facility]   PHYSICIAN:  Juleen China IV, MDDATE OF BIRTH:  [birth year]   DATE OF ADMISSION:  01/16/2008  DATE OF DISCHARGE:  01/17/2008                               DISCHARGE SUMMARY   ADMISSION DIAGNOSIS:  Observation of insertion of a right thigh  arteriovenous Gore-Tex graft for end-stage renal disease.   DISCHARGE DIAGNOSIS:  Observation after insertion of a right thigh  arteriovenous Gore-Tex graft for end-stage renal disease.   SECONDARY DISCHARGE DIAGNOSES:  1. Type 2 diabetes.  2. Cerebrovascular accident x2.  3. End-stage renal disease.  4. Hypertension.   CONSULTS:  Renal on January 17, 2008.   PROCEDURE:  Insertion of a right thigh arteriovenous Gore-Tex graft by  Dr. Myra Gianotti on January 16, 2008.   HOSPITAL COURSE:  This is a 65 year old female who has known vein  stenosis.  She had a left-sided graft which ultimately failed and  recently underwent a right forearm loop graft.  This was complicated by  venous hypertension, requiring ligation and removal of the graft.  This  was done on December 06, 2007.  The patient has been dialyzed through a  left-sided catheter, and the patient continued to come back for further  follow-up.  The patient does have end-stage renal disease requiring  access for hemodialysis.  Dr. Myra Gianotti discussed with the patient and her  daughter about performing a right thigh graft.  He discussed risk of  steal as well as infection.  They are willing to proceed, but however  would like to inquire about peritoneal dialysis.  By Dr. Myra Gianotti  tentatively place the patient on the operating schedule for January 16, 2008.  The patient signed consent and went through with procedure  electively on January 16, 2008 by Dr. Juleen China.  The patient  tolerated this procedure  without any complications and was later  transferred to the PACU, where the patient continued to remain stable  throughout the evening, and then later transferred to a room, where she  remained stable throughout the night.  Renal was consulted at this time  to have the patient set up for hemodialysis in the morning.  The patient  would receive dialysis through a Diatek.  The patient was given  instructions regarding when her right thigh graft could be used for  access.  The patient agreed to this plan.  On January 17, 2008, postop  day #1, the patient was afebrile.  Vital signs were stable.  The  patient's lungs were clear.  She had a good thrill in her right thigh  graft.  Her right foot was warm and filling well, and there were no  signs of still syndrome.  There was also no hematoma.  Plan was for the  patient to be discharged home that day after hemodialysis.  The patient  agreed with this plan and was discharged to home on January 17, 2008,  and was stable.   DISCHARGE MEDICATIONS:  1. Procardia XL 60 mg b.i.d.  2. Labetalol 200 mg b.i.d.  3. Lisinopril 40 mg b.i.d.  4. Renagel 100 mg 3 tablets with meals.  5. Reglan 5 mg daily.  6. Norvasc 5 mg with meals.  7. Multivitamin daily.   DISCHARGE INSTRUCTIONS:  The patient was discharged on February 29,  2009.  The patient agreed and understood instructions that were  discussed with the patient.  The patient was to continue to follow a  renal diet.  Clean gently her wound with soap and water.  The patient  was to increase walking activity slowly and may shower on January 18, 2008.  The patient is also told to contact office or ER if increased  redness, drainage, especially pus from incision site, swelling, or fever  greater than 101, or any numbness in her foot.  The patient was to  return to Dr. Myra Gianotti if needed, and he could be contacted at [phone].  The patient is to continue medications per reconciliation sheet.  The   patient was also given a prescription for Percocet 5/325 mg 1-2 tablets  to take q.6 h. as needed for pain if needed.  Again, the patient agreed  to these instructions and was discharged to home on January 17, 2008  and was currently stable.      Cyndy Freeze, PA      V. Charlena Cross, MD  Electronically Signed    ALW/MEDQ  D:  02/02/2008  T:  02/03/2008  Job:  [id number]

## 2011-04-10 NOTE — Op Note (Signed)
NAMETAMANI, [name]             ACCOUNT NO.:  [id number]   MEDICAL RECORD NO.:  [id number]          PATIENT TYPE:  OBV   LOCATION:  2550                         FACILITY:  [facility]   PHYSICIAN:  Quita Skye. Hart Rochester, M.D.  DATE OF BIRTH:  1946/01/03   DATE OF PROCEDURE:  11/16/2007  DATE OF DISCHARGE:  11/16/2007                               OPERATIVE REPORT   PREOPERATIVE DIAGNOSIS:  End-stage renal disease.   POSTOPERATIVE DIAGNOSIS:  End-stage renal disease.   OPERATION:  1. Bilateral ultrasound localization of internal jugular veins.  2. Insertion of Diatek catheter via left internal jugular vein (28      cm).   SURGEON:  Quita Skye. Hart Rochester, M.D.   FIRST ASSISTANT:  Nurse.   ANESTHESIA:  Local.   PROCEDURE:  The patient was taken to the operating room and placed in  the supine position at which time the upper chest and neck were exposed.  Both internal jugular veins were imaged using B-mode ultrasound.  The  right IJ appeared to be occluded at the level of the clavicle but patent  higher in the neck.  The left side was widely patent.  There was normal  flow characteristics.  After prepping and draping in routine sterile  manner, the left internal jugular vein was entered using a  supraclavicular approach.  Guidewire passed into the right atrium with  some difficulty because of a known innominate stenosis, but it did  traverse this area with a regular guidewire.  This guidewire was  exchanged for an Amplatz Super Stiff wire through a small dilator which  traversed the stenosis, and after dilating the tract appropriately, a 28-  cm Diatek catheter was positioned in the right atrium, tunneled  peripherally, secured with nylon sutures, and the wound closed with  Vicryl in a subcuticular fashion.  Sterile dressing applied.  The  patient taken to recovery room in satisfactory condition.      Quita Skye Hart Rochester, M.D.  Electronically Signed     JDL/MEDQ  D:  11/16/2007  T:   11/17/2007  Job:  [id number]

## 2011-04-10 NOTE — Op Note (Signed)
[name], [name]             ACCOUNT NO.:  [id number]   MEDICAL RECORD NO.:  [id number]          PATIENT TYPE:  INP   LOCATION:  6736                         FACILITY:  [facility]   PHYSICIAN:  Juleen China IV, MDDATE OF BIRTH:  [birth year]   DATE OF PROCEDURE:  01/16/2008  DATE OF DISCHARGE:                               OPERATIVE REPORT   PREOPERATIVE DIAGNOSIS:  End-stage renal disease.   POSTOPERATIVE DIAGNOSIS:  End-stage renal disease.   PROCEDURE PERFORMED:  Right thigh graft.   TYPE OF ANESTHESIA:  General.   BLOOD LOSS:  50 cc.   DRAINS:  None.   CULTURES:  None.   SPECIMENS:  None.   DRESSINGS:  Dermabond.   DESCRIPTION OF PROCEDURE:  The patient was identified in the holding  area and taken to room 6.  She was placed supine on the table.  General  endotracheal anesthesia was administered.  The patient's right leg and  lower abdomen were prepped and draped in standard sterile fashion.  A  time-out was called, and perioperative antibiotics were administered.   A longitudinal incision was made at the level of the groin crease over  the palpable pulse of the femoral artery.  Bovie cautery was used to  dissect the subcutaneous tissue.  The femoral sheath was identified and  opened sharply.  Common femoral artery was identified.  I went down and  further mobilized the superficial femoral artery for an appropriate  distance.  Next, attention was turned towards the vein.  The saphenous  vein was identified and dissected down to the junction at the femoral  vein.  The femoral vein was then skeletonized proximal and distal to  this.  The vein and its major branches were encircled with vessel loops.  At this point in time, a subcutaneous tunnel was created, 2 counter  incisions were made.  A 6 x 50 Gore-Tex stretch graft was brought to the  tunnel.  The patient was then systemically heparinized.  Vascular clamps  were then used to occlude the superficial femoral  artery.  The artery  was opened with a #11 blade and extended with Potts scissors.  The graft  was beveled to fit the size of the arteriotomy.  An end-to-side  anastomosis was created using running 6-0 Prolene.  Prior to completion  of anastomosis, the artery was flushed in antegrade and retrograde  fashion.  The anastomosis was then secured.  The graft was evaluated and  found to have pulsatile flow.  The graft was flushed with heparinized  saline and then reoccluded.  Next, the saphenous femoral junction was  ligated.  The stump of the saphenous was oversewn with 5-0 Prolene  suture.  The saphenous femoral junction was then opened with Potts  scissors.  The graft was then beveled to fit the size of the venotomy .  An end-to-side anastomosis was performed using running 6-0 Prolene.  Prior to completion of the anastomosis, the graft as well as the vein  was flushed in antegrade and retrograde fashion.  Anastomosis was then  secured.  All clamps were released.  At this point  in time, there was a  good thrill within the graft.  Doppler was used to evaluate the femoral  artery, which had a good signal as well as femoral vein.  Next, the  patient was given protamine.  The wounds were then copiously irrigated.  The deep tissue was reapproximated with 3-0 Vicryl.  The skin was closed  with running 4-0 Vicryl.  Dermabond was placed.  The patient tolerated  the procedure and was taken recovery room in stable condition.           ______________________________  V. Charlena Cross, MD  Electronically Signed     VWB/MEDQ  D:  01/16/2008  T:  01/17/2008  Job:  [id number]

## 2011-04-10 NOTE — Assessment & Plan Note (Signed)
OFFICE VISIT   QUINTA, EIMER A  DOB:  August 04, 1946                                       01/30/2008  CHART#:[id number]   The patient presents today for evaluation of followup after a right  thigh AV GORE-TEX graft placement by Dr. Myra Gianotti on 01/16/2008.  She  does have mild swelling in her right leg as compared to her left leg,  and this is not progressive.  She does not have any calf tenderness.  Her thigh incisions are healing quite nicely, as is her groin incision.  She has a very small, less than 1 cm area of the incision that has a  slight separation.  This does not track and does not have any drainage.  She does have a good thrill in her graft.  I discussed this with the  patient and her family present.  I feel that she is healing quite  nicely, and should be able to use her graft in approximately 1  additional week.  She will see [place] on a p.r.n. basis.   Larina Earthly, M.D.  Electronically Signed   TFE/MEDQ  D:  01/30/2008  T:  02/02/2008  Job:  [id number]   cc:   Jorge Ny, MD  [facility]

## 2011-04-10 NOTE — Assessment & Plan Note (Signed)
OFFICE VISIT   [name], [name]  DOB:  Jun 27, 1946                                       12/10/2007  CHART#:[id number]   EXAM:  The right forearm and upper arm still has some edema, although  the patient states that this is improved.  She has [name] 2+ radial pulse.  She has several tape blisters on the medial aspect of the arm, but these  seem to be healing.  The incision is healing well and her sutures are  still in place.  She will follow up in 1 week's time for reevaluation of  her wound and consideration for suture removal at that time.  The hand  was warm and well-perfused.  Overall, she is improved.   [name]. Fields, MD  Electronically Signed   CEF/MEDQ  D:  12/10/2007  T:  12/11/2007  Job:  [id number]

## 2011-04-10 NOTE — Assessment & Plan Note (Signed)
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  She had been dialyzed in [place] and had all her prior  procedures done there.  She presented to the emergency room late  Saturday night on 09/13/07 and was taken to the operating room on Sunday  morning.  She had an expanding acute false aneurysm at the lower portion  of her graft.  This was cultured and gram stain showed no evidence of  organisms.  She had replacement of the lower half of her upper arm graft  with vortex.  She has an excellent thrill currently and she is still  using her upper half of the graft for access.  She has nylon mattress  sutures in place today with no evidence of infection.  She will continue  her usual dialysis.  We will see her again in one week for suture  removal.   Larina Earthly, M.D.  Electronically Signed   TFE/MEDQ  D:  09/19/2007  T:  09/23/2007  Job:  [id number]   cc:   Rudene Anda [facility]

## 2011-04-10 NOTE — Op Note (Signed)
NAMESILVERIA, BOTZ             ACCOUNT NO.:  [id number]   MEDICAL RECORD NO.:  [id number]          PATIENT TYPE:  AMB   LOCATION:  SDS                          FACILITY:  [facility]   PHYSICIAN:  Quita Skye. Hart Rochester, M.D.  DATE OF BIRTH:  1946/05/23   DATE OF PROCEDURE:  12/06/2007  DATE OF DISCHARGE:                               OPERATIVE REPORT   PREOPERATIVE DIAGNOSIS:  End-stage renal disease with severe venous  hypertension right upper extremity secondary to recently placed AV graft  right forearm.   POSTOPERATIVE DIAGNOSIS:  End-stage renal disease with severe venous  hypertension right upper extremity secondary to recently placed AV graft  right forearm.   OPERATION:  Removal of AV graft right forearm with vein patch  angioplasty right brachial artery.   SURGEON:  Quita Skye. Hart Rochester, M.D.   FIRST ASSISTANT:  Nurse.   ANESTHESIA:  General endotracheal.   CULTURES SENT:  Segment of Gore-Tex graft.   PROCEDURE IN DETAIL:  The patient was taken to the operating room and  placed in a supine position at which time satisfactory general  endotracheal anesthesia was administered.  The right arm was prepped  with Betadine scrub solution and draped in routine sterile manner.  Previous antecubital wound was reopened, graft having been placed 3 days  earlier and the arterial and venous anastomoses exposed.  There was  excellent flow through the graft and no evidence of any infection.  There was blistering of the skin secondary to edema with swelling from  the fingertips to the axilla.  The brachial artery was controlled  proximally and distally with clamps and the graft transected adjacent to  the anastomosis and the graft completely excised.  On the venous side  the cephalic vein was ligated proximally and transected and a piece of  the cephalic vein was preserved, opened longitudinally and sewn in as a  patch over the brachial artery with 6-0 Prolene.  Following appropriate  flushing  this was completed, clamps released and there was a good pulse  and good radial arterial flow at the wrist.  The graft having been  completely transected on both ends was then able to be pulled from the  tunnel from the arterial side without opening the distal wound.  There  was no purulent drainage or any evidence of infection of the graft  although a piece of the graft was sent for culture.  After thorough  irrigation the wound was closed in layers with interrupted Vicryl and  interrupted 4-0 nylon.  Sterile dressing applied.  The patient taken to  recovery room in satisfactory condition.      Quita Skye Hart Rochester, M.D.  Electronically Signed     JDL/MEDQ  D:  12/06/2007  T:  12/06/2007  Job:  [id number]

## 2011-04-10 NOTE — Op Note (Signed)
[name], [name]             ACCOUNT NO.:  [id number]   MEDICAL RECORD NO.:  [id number]          PATIENT TYPE:  INP   LOCATION:  5524                         FACILITY:  [facility]   PHYSICIAN:  Larina Earthly, M.D.    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   PROCEDURE:  Resection of false aneurysm, replacement with a new segment  of 6 mm standard wall Gore-Tex graft.   SURGEON:  Larina Earthly, M.D.   ASSISTANT:  Nurse.   ANESTHESIA:  LMA.   COMPLICATIONS:  None.   DISPOSITION:  To recovery room stable.   INDICATIONS FOR PROCEDURE:  The patient is a 65 year old black female  who presented to the emergency department the night prior to this  admission with a large false aneurysm in her left upper arm AV graft.  She reports this has been progressive over several days over the past  week.  She has not been treated before at [facility] and her family  with her said that she was treated in [facility] with  multiple revisions to this.  She is taken to the operative at this time  for exploration.  There was erythema and felt this may be infected and  if infected, would be removed, if not infected would be replaced.   PROCEDURE IN DETAIL:  The patient was taken to room, placed supine  position.  Area of the left arm was prepped and draped usual sterile  fashion.  Incision was made near the antecubital space the level of the  prior arterial anastomosis and the graft was exposed here for control.  This was occluded with a fistula clamp.  Next, incision was made over  the false aneurysm and entered the large false aneurysm sac.  Venous  backbleeding was controlled with direct pressure of the graft  above  this.  A large amount of mural thrombus was removed.  There was no pus  present.  The graft was flushed proximally and was occluded with a  fistula clamp.  Gram stain and C&S was sent and Gram stain showed no  organisms few white cells.  The aneurysm sac was debrided and bleeding  was controlled with cautery.  The graft was exposed and there was a  covered stent had been placed in the graft for prior treatment of false  aneurysm at this level at this office.  It was unsuccessful and there  had been continued breakdown of the graft at the false aneurysm  junction.  The graft was exposed further proximally.  The patient had  been being dialyzed successfully through the graft in the area above  this false aneurysm for the past week.  After this reason, decision was  made to replace this segment of the graft.  The new interposition graft  was brought  onto the field and was sewn end-to-end to the venous portion  of the graft, above the false aneurysm it was then tunneled down to the  level of the arterial anastomosis.  The graft was divided near the  arterial anastomosis and the new interposition graft was cut to the  appropriate length and sewn end-to-end to the old graft near the  arterial anastomosis with a running 6-0 Prolene suture.  Clamps were  removed and excellent thrill was noted.  The wound was irrigated with  saline.  Hemostasis with electrocautery.  The wound at the antecubital  space was closed with 3-0 Vicryl in subcutaneous and subcuticular  tissue.  The area of the false aneurysm was closed first with several  interrupted 3-0 Vicryl sutures to reapproximate subcutaneous tissue over  the graft.  The skin was then closed with 3-0 nylon mattress sutures.  Sterile dressing was applied.  The patient taken to recovery room stable  condition.      Larina Earthly, M.D.  Electronically Signed     TFE/MEDQ  D:  09/14/2007  T:  09/15/2007  Job:  [id number]

## 2011-04-10 NOTE — Op Note (Signed)
NAMEJAHNAE, [name]             ACCOUNT NO.:  [id number]   MEDICAL RECORD NO.:  [id number]          PATIENT TYPE:  AMB   LOCATION:  SDS                          FACILITY:  [facility]   PHYSICIAN:  Di Kindle. Edilia Bo, M.D.DATE OF BIRTH:  1946/04/06   DATE OF PROCEDURE:  12/03/2007  DATE OF DISCHARGE:                               OPERATIVE REPORT   PREOPERATIVE DIAGNOSIS:  Chronic renal failure.   POSTOPERATIVE DIAGNOSIS:  Chronic renal failure.   PROCEDURE:  Insertion of new right forearm loop arteriovenous graft (4-7  mm graft).   SURGEON:  Di Kindle. Edilia Bo, M.D.   ASSISTANT:  Jerold Coombe, P.A.   ANESTHESIA:  Local with sedation.   TECHNIQUE:  The patient was taken to the operating room and sedated by  anesthesia.  The right upper extremity was prepped and draped in the  usual sterile fashion.  After the skin was infiltrated with 1%  lidocaine, a transverse incision was made at the antecubital level and  here the brachial artery and adjacent brachial vein were dissected free.  The vein that was just lateral to the artery was the largest of the  available veins.  Using one distal counterincision, a 4-7 mm graft was  tunneled in a loop fashion to the forearm with the venous aspect of the  graft along the radial aspect of the forearm.  The patient was then  heparinized.  The brachial artery was clamped proximally and distally  and a longitudinal arteriotomy was made.  A segment of the 4-mm end of  the graft was fixed excised and the graft slightly spatulated and sewn  end-to-side to the artery using continuous 6-0 Prolene suture.  The  graft was then pulled to the appropriate length for anastomosis to the  brachial vein.  The vein had been ligated distally and spatulated  proximally.  It  was cut to the appropriate length, spatulated sewn end-  to-end to the vein using continuous 6-0 Prolene suture.  Of note, the  vein did take the 4.5-mm dilator.  It probably  would have taken a 5 but  I did not want to stretch the vein and injure the intima.  At the  completion there was an excellent thrill in the graft had a palpable  radial pulse.  Hemostasis was obtained in the wounds.  The wounds were  closed with a deep layer of 3-0 Vicryl and the skin closed with 4-0  Vicryl.  A sterile dressing was applied.  The patient tolerated the  procedure well, was transferred to the recovery room in satisfactory  condition.  All needle and sponge counts were correct.      Di Kindle. Edilia Bo, M.D.  Electronically Signed     CSD/MEDQ  D:  12/03/2007  T:  12/03/2007  Job:  [id number]

## 2011-04-10 NOTE — Assessment & Plan Note (Signed)
OFFICE VISIT   GENESYS, COGGESHALL A  DOB:  Dec 17, 1945                                       12/29/2007  CHART#:[id number]   REASON FOR VISIT:  Dialysis access.   HISTORY:  This is a 65 year old female who has known innominate vein  stenosis.  She had a left-sided graft, which ultimately failed and  recently underwent a right forearm loop graft.  This was complicated by  venous hypertension requiring ligation and removal of the graft.  This  was done on December 06, 2007.  The patient has been dialyzed through a  left-sided catheter.  She comes back in for further followup.   PHYSICAL EXAMINATION:  Blood pressure is 160/80, pulse 52, respirations  18, O2 saturations 100%.  In general, she is in a wheelchair, in no  acute distress.  Cardiovascular is regular rate and rhythm.  Respirations not labored.  Abdomen is soft.  Her extremities are warm  and well-perfused.  She has a palpable right femoral pulse, as well as a  palpable right dorsalis pedis pulse.   ASSESSMENT AND PLAN:  End-stage renal disease.   PLAN:  I discussed with the patient and her daughter performing a right  thigh graft.  We discussed risks of steal as well as infection.  They  are willing to proceed.  However, would like to enquire about peritoneal  dialysis.  I tentatively placed the patient on the operating room  schedule for February 20.   Jorge Ny, MD  Electronically Signed   VWB/MEDQ  D:  12/29/2007  T:  12/30/2007  Job:  [id number]   cc:   [facility]

## 2011-07-05 NOTE — Discharge Summary (Signed)
  [name], BACK             ACCOUNT NO.:  [id number]  MEDICAL RECORD NO.:  [id number]  LOCATION:  2899                         FACILITY:  [facility]  PHYSICIAN:  Di Kindle. Edilia Bo, M.D.DATE OF BIRTH:  June 28, 1946  DATE OF ADMISSION:  03/30/2011 DATE OF DISCHARGE:  03/30/2011                              DISCHARGE SUMMARY   Please note in reviewing her E-chart records, there was no evidence that this patient was admitted; however, it listed under inpatient and therefore, I have been asked repeatedly to dictate a discharge summary. I am therefore attempting to dictate a discharge summary based on the very limited information that is available from her access anywhere in E- chart.  REASON FOR ADMISSION:  Aneurysm of right thigh AV graft.  HISTORY:  This is a 65 year old woman with an aneurysm along the arterial half of her right thigh AV graft.  She was brought in for revision of the graft because of the risk of rupture.  HOSPITAL COURSE:  The patient was admitted on Mar 30, 2011.  She underwent thrombectomy and revision of her right thigh AV graft by bypassing around the aneurysmal segment.  The patient was monitored in the recovery room postoperatively and discharged to home from the recovery room through Short-Stay.  No complications were noted.  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  PROCEDURES:  Thrombectomy and revision of right thigh AV graft on Mar 30, 2011.  CONDITION ON DISCHARGE:  Good.  DISPOSITION:  Discharge is to home.  DISCHARGE MEDICATIONS: 1. Aspirin 81 mg p.o. daily. 2. Renal vitamin one p.o. q.a.m. 3. Renagel 800 mg p.o. two capsules with meals and snacks. 4. Sensipar 60 mg two tablets with supper. 5. Lisinopril 40 mg p.o. nightly.  PERTINENT LABORATORY STUDIES:  Potassium is 3.7.     Di Kindle. Edilia Bo, M.D.     CSD/MEDQ  D:  07/04/2011  T:  07/05/2011  Job:  [id number]  Electronically  Signed by Waverly Ferrari M.D. on 07/05/2011 03:42:15 PM

## 2011-08-16 LAB — POCT I-STAT 4, (NA,K, GLUC, HGB,HCT)
Glucose, Bld: 74
HCT: 45
Hemoglobin: 13.6
Operator id: 153981
Potassium: 4.9
Potassium: 5.1
Sodium: 131 — ABNORMAL LOW
Sodium: 135

## 2011-08-16 LAB — BASIC METABOLIC PANEL
BUN: 34 — ABNORMAL HIGH
CO2: 29
Calcium: 8.9
Chloride: 91 — ABNORMAL LOW
Creatinine, Ser: 5.79 — ABNORMAL HIGH
Glucose, Bld: 133 — ABNORMAL HIGH

## 2011-08-16 LAB — ANAEROBIC CULTURE

## 2011-08-16 LAB — DIFFERENTIAL
Basophils Absolute: 0.1
Basophils Relative: 2 — ABNORMAL HIGH
Eosinophils Absolute: 0.3
Lymphocytes Relative: 7 — ABNORMAL LOW
Monocytes Absolute: 0.4
Neutrophils Relative %: 80 — ABNORMAL HIGH

## 2011-08-16 LAB — PROTIME-INR: Prothrombin Time: 13.3

## 2011-08-16 LAB — WOUND CULTURE: Culture: NO GROWTH

## 2011-08-16 LAB — CBC
MCHC: 32.5
MCV: 91.4
RDW: 16.3 — ABNORMAL HIGH

## 2011-08-17 LAB — RENAL FUNCTION PANEL
Albumin: 3.1 — ABNORMAL LOW
CO2: 27
CO2: 28
CO2: 31
Chloride: 100
Chloride: 102
Chloride: 103
GFR calc Af Amer: 10 — ABNORMAL LOW
GFR calc Af Amer: 8 — ABNORMAL LOW
GFR calc non Af Amer: 7 — ABNORMAL LOW
GFR calc non Af Amer: 8 — ABNORMAL LOW
Glucose, Bld: 81
Glucose, Bld: 87
Phosphorus: 5.1 — ABNORMAL HIGH
Potassium: 4.7
Potassium: 5.4 — ABNORMAL HIGH
Sodium: 139
Sodium: 141
Sodium: 142

## 2011-08-17 LAB — POCT I-STAT 4, (NA,K, GLUC, HGB,HCT)
Glucose, Bld: 74
HCT: 43
Hemoglobin: 14.6
Hemoglobin: 16 — ABNORMAL HIGH
Operator id: 206361
Potassium: 5.2 — ABNORMAL HIGH
Sodium: 136
Sodium: 136

## 2011-08-17 LAB — PROTIME-INR: Prothrombin Time: 12.4

## 2011-08-17 LAB — CBC
HCT: 34 — ABNORMAL LOW
Hemoglobin: 11 — ABNORMAL LOW
RBC: 3.72 — ABNORMAL LOW
RDW: 17.9 — ABNORMAL HIGH
WBC: 6.2

## 2011-08-31 LAB — COMPREHENSIVE METABOLIC PANEL
ALT: 10
AST: 18
CO2: 27
Chloride: 97
Creatinine, Ser: 6.66 — ABNORMAL HIGH
GFR calc Af Amer: 8 — ABNORMAL LOW
GFR calc non Af Amer: 6 — ABNORMAL LOW
Glucose, Bld: 80
Total Bilirubin: 1.1

## 2011-08-31 LAB — POCT I-STAT 4, (NA,K, GLUC, HGB,HCT)
Glucose, Bld: 71
HCT: 41

## 2011-08-31 LAB — APTT: aPTT: 31

## 2011-08-31 LAB — CBC
Hemoglobin: 12.3
MCV: 90.2
RBC: 4.1
WBC: 4

## 2011-09-03 LAB — I-STAT 8, (EC8 V) (CONVERTED LAB)
Bicarbonate: 27.7 — ABNORMAL HIGH
HCT: 41
Hemoglobin: 13.9
Operator id: 270111
Sodium: 136
TCO2: 29

## 2011-09-05 LAB — CBC
HCT: 45.8
Hemoglobin: 14.8
MCHC: 32.3
MCV: 90.3
RDW: 17.7 — ABNORMAL HIGH

## 2011-09-05 LAB — COMPREHENSIVE METABOLIC PANEL
Alkaline Phosphatase: 64
BUN: 10
Calcium: 10
Glucose, Bld: 92
Potassium: 3.4 — ABNORMAL LOW
Total Protein: 7.4

## 2011-09-05 LAB — WOUND CULTURE: Culture: NO GROWTH

## 2011-09-05 LAB — APTT: aPTT: 34

## 2011-09-05 LAB — CULTURE, BLOOD (ROUTINE X 2): Culture: NO GROWTH

## 2011-09-05 LAB — PROTIME-INR: INR: 1.7 — ABNORMAL HIGH

## 2011-09-05 LAB — GRAM STAIN

## 2011-09-05 LAB — DIFFERENTIAL
Basophils Relative: 1
Monocytes Relative: 9
Neutro Abs: 3.6
Neutrophils Relative %: 79 — ABNORMAL HIGH

## 2011-09-05 LAB — ANAEROBIC CULTURE

## 2012-07-25 ENCOUNTER — Emergency Department (HOSPITAL_BASED_OUTPATIENT_CLINIC_OR_DEPARTMENT_OTHER)
Admission: EM | Admit: 2012-07-25 | Discharge: 2012-07-25 | Disposition: A | Discharge status: Home / Self Care | Payer: Medicare Other | Attending: Emergency Medicine | Admitting: Emergency Medicine

## 2012-07-25 ENCOUNTER — Encounter (HOSPITAL_BASED_OUTPATIENT_CLINIC_OR_DEPARTMENT_OTHER): Payer: Self-pay | Admitting: *Deleted

## 2012-07-25 DIAGNOSIS — I82A21 Chronic embolism and thrombosis of right axillary vein: Secondary | ICD-10-CM

## 2012-07-25 DIAGNOSIS — M79609 Pain in unspecified limb: Secondary | ICD-10-CM | POA: Insufficient documentation

## 2012-07-25 DIAGNOSIS — E119 Type 2 diabetes mellitus without complications: Secondary | ICD-10-CM | POA: Insufficient documentation

## 2012-07-25 DIAGNOSIS — Z880 Allergy status to penicillin: Secondary | ICD-10-CM | POA: Insufficient documentation

## 2012-07-25 DIAGNOSIS — M79601 Pain in right arm: Secondary | ICD-10-CM

## 2012-07-25 DIAGNOSIS — I10 Essential (primary) hypertension: Secondary | ICD-10-CM | POA: Insufficient documentation

## 2012-07-25 DIAGNOSIS — Z8673 Personal history of transient ischemic attack (TIA), and cerebral infarction without residual deficits: Secondary | ICD-10-CM | POA: Insufficient documentation

## 2012-07-25 HISTORY — DX: Essential (primary) hypertension: I10

## 2012-07-25 HISTORY — DX: Cerebral infarction, unspecified: I63.9

## 2012-07-25 MED ORDER — OXYCODONE-ACETAMINOPHEN 5-325 MG PO TABS: 1.0000 | ORAL_TABLET | Freq: Four times a day (QID) | ORAL | Status: AC | PRN

## 2012-07-25 NOTE — ED Notes (Signed)
Pain in her right arm. Was seen at [facility] last month for same pain and had an U/S that showed an old clot. They offered pain medication as treatment. Hx of dialysis shunt in same arm that was removed a couple of years ago.

## 2012-07-25 NOTE — ED Provider Notes (Signed)
History     CSN: [id number]  Arrival date & time 07/25/12  1256   First MD Initiated Contact with Patient 07/25/12 1412      Chief Complaint  Patient presents with  . Arm Pain    (Consider location/radiation/quality/duration/timing/severity/associated sxs/prior treatment) HPI Comments: Was seen at [facility] last month for the same.  Had a [place] and mri performed and was told it was an "old clot" in the area where an attempt to place a dialysis fistula failed.  They told her that no treatment was necessary.  Patient is a 66 y.o. female presenting with arm pain. The history is provided by the patient.  Arm Pain This is a recurrent problem. Episode onset: today. The problem occurs constantly. The problem has been rapidly worsening. Pertinent negatives include no chest pain and no shortness of breath. Exacerbated by: movement, palpation. Nothing relieves the symptoms. Treatments tried: percocet. The treatment provided mild relief.    Past Medical History  Diagnosis Date  . Hypertension   . Renal disorder   . Stroke   . Diabetes mellitus     Past Surgical History  Procedure Date  . Cholecystectomy   . Vascular surgery   . Thyroidectomy   . Eye surgery     No family history on file.  History  Substance Use Topics  . Smoking status: Never Smoker   . Smokeless tobacco: Not on file  . Alcohol Use: No    OB History    Grav Para Term Preterm Abortions TAB SAB Ect Mult Living                  Review of Systems  Respiratory: Negative for shortness of breath.   Cardiovascular: Negative for chest pain.  All other systems reviewed and are negative.    Allergies  Penicillins  Home Medications   Current Outpatient Rx  Name Route Sig Dispense Refill  . ASPIRIN 81 MG PO TABS Oral Take 81 mg by mouth daily.    . SENSIPAR PO Oral Take by mouth.    Marland Kitchen LISINOPRIL PO Oral Take by mouth.      BP 159/83  Pulse 71  Temp 99.2 F (37.3 C) (Oral)  Resp 20  SpO2 99%  Physical Exam   Nursing note and vitals reviewed. Constitutional: She is oriented to person, place, and time. She appears well-developed and well-nourished.  HENT:  Head: Normocephalic and atraumatic.  Neck: Normal range of motion. Neck supple.  Cardiovascular: Normal rate and regular rhythm.   No murmur heard. Pulmonary/Chest: Effort normal and breath sounds normal.  Musculoskeletal:       The right arm is noted to be tender to palpation over the medial aspect of the elbow.  There is no palpable abnormality.  The ulnar and radial pulses are palpable distally.    Neurological: She is alert and oriented to person, place, and time.  Skin: Skin is warm and dry.    ED Course  Procedures (including critical care time)  Labs Reviewed - No data to display No results found.   No diagnosis found.    MDM  The patient presents complaining of pain in the right arm.  She had similar pain one month ago and was seen at [facility].  She was told she had a clot but nothing that required treatment.  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  Her pcp and nephrologist are  aware of this and do not feel as though treatment is indicated.  I see nothing else that appears new or acute and I believe she is stable for discharge.  Her pain medications will be refilled and she will be discharged to home.          Geoffery Lyons, MD 07/25/12 [phone]

## 2013-10-15 ENCOUNTER — Other Ambulatory Visit (HOSPITAL_COMMUNITY): Payer: Self-pay | Admitting: Internal Medicine

## 2013-10-15 DIAGNOSIS — N186 End stage renal disease: Secondary | ICD-10-CM

## 2013-10-15 DIAGNOSIS — T82898A Other specified complication of vascular prosthetic devices, implants and grafts, initial encounter: Secondary | ICD-10-CM

## 2013-10-16 ENCOUNTER — Encounter (HOSPITAL_COMMUNITY): Payer: Self-pay

## 2013-10-16 ENCOUNTER — Other Ambulatory Visit (HOSPITAL_COMMUNITY): Payer: Self-pay | Admitting: Internal Medicine

## 2013-10-16 ENCOUNTER — Ambulatory Visit (HOSPITAL_COMMUNITY)
Admission: RE | Admit: 2013-10-16 | Discharge: 2013-10-16 | Disposition: A | Discharge status: Home / Self Care | Payer: Medicare Other | Source: Ambulatory Visit | Attending: Internal Medicine | Admitting: Internal Medicine

## 2013-10-16 DIAGNOSIS — Z992 Dependence on renal dialysis: Secondary | ICD-10-CM | POA: Insufficient documentation

## 2013-10-16 DIAGNOSIS — Z931 Gastrostomy status: Secondary | ICD-10-CM | POA: Insufficient documentation

## 2013-10-16 DIAGNOSIS — Z87820 Personal history of traumatic brain injury: Secondary | ICD-10-CM | POA: Insufficient documentation

## 2013-10-16 DIAGNOSIS — Z7982 Long term (current) use of aspirin: Secondary | ICD-10-CM | POA: Insufficient documentation

## 2013-10-16 DIAGNOSIS — I120 Hypertensive chronic kidney disease with stage 5 chronic kidney disease or end stage renal disease: Secondary | ICD-10-CM | POA: Insufficient documentation

## 2013-10-16 DIAGNOSIS — T82898A Other specified complication of vascular prosthetic devices, implants and grafts, initial encounter: Secondary | ICD-10-CM

## 2013-10-16 DIAGNOSIS — N186 End stage renal disease: Secondary | ICD-10-CM | POA: Insufficient documentation

## 2013-10-16 DIAGNOSIS — E119 Type 2 diabetes mellitus without complications: Secondary | ICD-10-CM | POA: Insufficient documentation

## 2013-10-16 DIAGNOSIS — Z930 Tracheostomy status: Secondary | ICD-10-CM | POA: Insufficient documentation

## 2013-10-16 MED ORDER — IOHEXOL 300 MG/ML  SOLN
100.0000 mL | Freq: Once | INTRAMUSCULAR | Status: AC | PRN
  Administered 2013-10-16: 32 mL via INTRAVENOUS

## 2013-10-16 NOTE — H&P (Signed)
  HPI: [name] is an 67 y.o. female from [facility]. She is a long time hemodialysis pt who suffered a traumatic brain injury. She has since undergone tracheostomy and PEG for Resp failure and FTT. Per the son she was making some progress but has plateaued. She has a right thigh AVF loop graft and has been receiving dialysis. Recently she has developed some prolonged bleeding after treatments. Her Nephrologist, Dr. Rayna Sexton, has requested shuntogram and intervention, and if unable to treat, she will need alternative access by means of an HD catheter. She is not on any blood thinners and has no infectious issues ongoing. PMHx and meds reviewed.   Past Medical History:  Past Medical History  Diagnosis Date  . Hypertension   . Renal disorder   . Stroke   . Diabetes mellitus     Past Surgical History:  Past Surgical History  Procedure Laterality Date  . Cholecystectomy    . Vascular surgery    . Thyroidectomy    . Eye surgery      Family History: No family history on file.  Social History:  reports that she has never smoked. She does not have any smokeless tobacco history on file. She reports that she does not drink alcohol or use illicit drugs.  Allergies:  Allergies  Allergen Reactions  . Penicillins Rash    Medications:   Medication List    ASK your doctor about these medications       aspirin 81 MG tablet  Take 81 mg by mouth daily.     LISINOPRIL PO  Take by mouth.     SENSIPAR PO  Take by mouth.        Please HPI for pertinent positives, otherwise complete 10 system ROS negative.  Physical Exam: BP 114/68  Pulse 93  Temp(Src) 97.8 F (36.6 C) (Axillary)  Resp 19  SpO2 99% There is no height or weight on file to calculate BMI.   General Appearance:  Alert, cooperative, no distress, appears stated age  Head:  Normocephalic, without obvious abnormality, atraumatic  Neck: Supple,tracheostomy site clean.  Lungs:   Clear to  auscultation bilaterally, no w/r/r, respirations unlabored without use of accessory muscles.  Chest Wall:  No tenderness or deformity  Heart:  Regular rate and rhythm, S1, S2 normal, no murmur, rub or gallop.  Abdomen:   Soft, non-tender, non distended.  Extremities: Rt thigh loop graft palpable, dressings in place.  Neurologic: Normal affect, no gross deficits.   No results found for this or any previous visit (from the past 48 hour(s)). No results found.  Assessment/Plan ESRD Rt thigh loop AVG with prolonged bleeding Discussed via telephone and also to son, plan for shuntogram, possible angioplasty/stent, and possible need for dialysis catheter placement if needed. Explained procedure, risks, complications, use of sedation. No labs Consent signed in chart  Brayton El PA-C 10/16/2013, 11:23 AM

## 2013-10-16 NOTE — Procedures (Signed)
Successful fem AVG SHUNTOGRAM NO SIG STENOSIS READY FOR USE FULL REPORT IN PACS

## 2013-10-19 ENCOUNTER — Telehealth (HOSPITAL_COMMUNITY): Payer: Self-pay | Admitting: Radiology

## 2014-06-26 ENCOUNTER — Emergency Department (HOSPITAL_COMMUNITY)
Admission: EM | Admit: 2014-06-26 | Discharge: 2014-06-27 | Disposition: A | Discharge status: Home / Self Care | Payer: Medicare Other | Source: Home / Self Care | Attending: Emergency Medicine | Admitting: Emergency Medicine

## 2014-06-26 ENCOUNTER — Encounter (HOSPITAL_COMMUNITY): Payer: Self-pay | Admitting: Emergency Medicine

## 2014-06-26 DIAGNOSIS — Z9911 Dependence on respirator [ventilator] status: Secondary | ICD-10-CM | POA: Diagnosis not present

## 2014-06-26 DIAGNOSIS — Z992 Dependence on renal dialysis: Secondary | ICD-10-CM

## 2014-06-26 DIAGNOSIS — Z6830 Body mass index (BMI) 30.0-30.9, adult: Secondary | ICD-10-CM

## 2014-06-26 DIAGNOSIS — G40909 Epilepsy, unspecified, not intractable, without status epilepticus: Secondary | ICD-10-CM | POA: Diagnosis present

## 2014-06-26 DIAGNOSIS — Z86718 Personal history of other venous thrombosis and embolism: Secondary | ICD-10-CM | POA: Diagnosis not present

## 2014-06-26 DIAGNOSIS — Z833 Family history of diabetes mellitus: Secondary | ICD-10-CM | POA: Diagnosis not present

## 2014-06-26 DIAGNOSIS — E119 Type 2 diabetes mellitus without complications: Secondary | ICD-10-CM

## 2014-06-26 DIAGNOSIS — Z79899 Other long term (current) drug therapy: Secondary | ICD-10-CM

## 2014-06-26 DIAGNOSIS — Z430 Encounter for attention to tracheostomy: Secondary | ICD-10-CM | POA: Diagnosis not present

## 2014-06-26 DIAGNOSIS — I469 Cardiac arrest, cause unspecified: Secondary | ICD-10-CM | POA: Diagnosis present

## 2014-06-26 DIAGNOSIS — D631 Anemia in chronic kidney disease: Secondary | ICD-10-CM | POA: Diagnosis present

## 2014-06-26 DIAGNOSIS — N039 Chronic nephritic syndrome with unspecified morphologic changes: Secondary | ICD-10-CM

## 2014-06-26 DIAGNOSIS — Z8249 Family history of ischemic heart disease and other diseases of the circulatory system: Secondary | ICD-10-CM | POA: Diagnosis not present

## 2014-06-26 DIAGNOSIS — R109 Unspecified abdominal pain: Secondary | ICD-10-CM

## 2014-06-26 DIAGNOSIS — Z8673 Personal history of transient ischemic attack (TIA), and cerebral infarction without residual deficits: Secondary | ICD-10-CM

## 2014-06-26 DIAGNOSIS — E669 Obesity, unspecified: Secondary | ICD-10-CM | POA: Diagnosis present

## 2014-06-26 DIAGNOSIS — T82598A Other mechanical complication of other cardiac and vascular devices and implants, initial encounter: Principal | ICD-10-CM | POA: Diagnosis present

## 2014-06-26 DIAGNOSIS — R1032 Left lower quadrant pain: Secondary | ICD-10-CM | POA: Diagnosis present

## 2014-06-26 DIAGNOSIS — Z823 Family history of stroke: Secondary | ICD-10-CM | POA: Diagnosis not present

## 2014-06-26 DIAGNOSIS — Z87891 Personal history of nicotine dependence: Secondary | ICD-10-CM

## 2014-06-26 DIAGNOSIS — J15212 Pneumonia due to Methicillin resistant Staphylococcus aureus: Secondary | ICD-10-CM | POA: Diagnosis present

## 2014-06-26 DIAGNOSIS — Z880 Allergy status to penicillin: Secondary | ICD-10-CM

## 2014-06-26 DIAGNOSIS — E876 Hypokalemia: Secondary | ICD-10-CM | POA: Diagnosis not present

## 2014-06-26 DIAGNOSIS — T794XXA Traumatic shock, initial encounter: Secondary | ICD-10-CM | POA: Diagnosis present

## 2014-06-26 DIAGNOSIS — A0472 Enterocolitis due to Clostridium difficile, not specified as recurrent: Secondary | ICD-10-CM | POA: Diagnosis present

## 2014-06-26 DIAGNOSIS — N2581 Secondary hyperparathyroidism of renal origin: Secondary | ICD-10-CM | POA: Diagnosis present

## 2014-06-26 DIAGNOSIS — I120 Hypertensive chronic kidney disease with stage 5 chronic kidney disease or end stage renal disease: Secondary | ICD-10-CM | POA: Diagnosis present

## 2014-06-26 DIAGNOSIS — J9620 Acute and chronic respiratory failure, unspecified whether with hypoxia or hypercapnia: Secondary | ICD-10-CM | POA: Diagnosis present

## 2014-06-26 DIAGNOSIS — G4737 Central sleep apnea in conditions classified elsewhere: Secondary | ICD-10-CM | POA: Diagnosis present

## 2014-06-26 DIAGNOSIS — R578 Other shock: Secondary | ICD-10-CM | POA: Diagnosis present

## 2014-06-26 DIAGNOSIS — G9340 Encephalopathy, unspecified: Secondary | ICD-10-CM | POA: Diagnosis present

## 2014-06-26 DIAGNOSIS — I38 Endocarditis, valve unspecified: Secondary | ICD-10-CM | POA: Diagnosis present

## 2014-06-26 DIAGNOSIS — E1129 Type 2 diabetes mellitus with other diabetic kidney complication: Secondary | ICD-10-CM | POA: Diagnosis present

## 2014-06-26 DIAGNOSIS — Z931 Gastrostomy status: Secondary | ICD-10-CM | POA: Diagnosis not present

## 2014-06-26 DIAGNOSIS — D62 Acute posthemorrhagic anemia: Secondary | ICD-10-CM | POA: Diagnosis present

## 2014-06-26 DIAGNOSIS — N186 End stage renal disease: Secondary | ICD-10-CM

## 2014-06-26 DIAGNOSIS — Z7982 Long term (current) use of aspirin: Secondary | ICD-10-CM | POA: Insufficient documentation

## 2014-06-26 DIAGNOSIS — N058 Unspecified nephritic syndrome with other morphologic changes: Secondary | ICD-10-CM | POA: Diagnosis present

## 2014-06-26 DIAGNOSIS — Z87820 Personal history of traumatic brain injury: Secondary | ICD-10-CM

## 2014-06-26 DIAGNOSIS — N390 Urinary tract infection, site not specified: Secondary | ICD-10-CM

## 2014-06-26 LAB — CBC WITH DIFFERENTIAL/PLATELET
Basophils Absolute: 0 10*3/uL (ref 0.0–0.1)
Basophils Relative: 0 % (ref 0–1)
EOS ABS: 0.1 10*3/uL (ref 0.0–0.7)
Eosinophils Relative: 1 % (ref 0–5)
HCT: 28.5 % — ABNORMAL LOW (ref 36.0–46.0)
HEMOGLOBIN: 8.8 g/dL — AB (ref 12.0–15.0)
LYMPHS ABS: 0.9 10*3/uL (ref 0.7–4.0)
LYMPHS PCT: 14 % (ref 12–46)
MCH: 28.3 pg (ref 26.0–34.0)
MCHC: 30.9 g/dL (ref 30.0–36.0)
MCV: 91.6 fL (ref 78.0–100.0)
MONOS PCT: 4 % (ref 3–12)
Monocytes Absolute: 0.2 10*3/uL (ref 0.1–1.0)
NEUTROS ABS: 5.2 10*3/uL (ref 1.7–7.7)
NEUTROS PCT: 81 % — AB (ref 43–77)
PLATELETS: 203 10*3/uL (ref 150–400)
RBC: 3.11 MIL/uL — AB (ref 3.87–5.11)
RDW: 16.7 % — ABNORMAL HIGH (ref 11.5–15.5)
WBC: 6.3 10*3/uL (ref 4.0–10.5)

## 2014-06-26 MED ORDER — IOHEXOL 300 MG/ML  SOLN: 25.0000 mL | Freq: Once | INTRAMUSCULAR | Status: AC | PRN

## 2014-06-26 NOTE — ED Provider Notes (Signed)
CSN: [id number]     Arrival date & time 06/26/14  2245 History   First MD Initiated Contact with Patient 06/26/14 2313     Chief Complaint  Patient presents with  . Abdominal Pain     (Consider location/radiation/quality/duration/timing/severity/associated sxs/prior Treatment) Patient is a 68 y.o. female presenting with abdominal pain. The history is provided by a relative. The history is limited by the condition of the patient (Aphasia).  Abdominal Pain She has been complaining of intermittent left lower abdominal pain for the last several weeks. There has been no known vomiting or diarrhea. There's been no known fever. She is fed entirely through PEG tube so appetite cannot be assessed. Family has been talking with the staff at the nursing home where she lives in time they were able to get her sent over for evaluation. Symptoms seem to be stable over the several weeks.  Past Medical History  Diagnosis Date  . Hypertension   . Renal disorder   . Stroke   . Diabetes mellitus    Past Surgical History  Procedure Laterality Date  . Cholecystectomy    . Vascular surgery    . Thyroidectomy    . Eye surgery    . Craniotomy     No family history on file. History  Substance Use Topics  . Smoking status: Never Smoker   . Smokeless tobacco: Not on file  . Alcohol Use: No   OB History   Grav Para Term Preterm Abortions TAB SAB Ect Mult Living                 Review of Systems  Unable to perform ROS: Patient nonverbal  Gastrointestinal: Positive for abdominal pain.      Allergies  Penicillins  Home Medications   Prior to Admission medications   Medication Sig Start Date End Date Taking? Authorizing Provider  aspirin 81 MG tablet Take 81 mg by mouth daily.    Historical Provider, MD  Cinacalcet HCl (SENSIPAR PO) Take by mouth.    Historical Provider, MD  LISINOPRIL PO Take by mouth.    Historical Provider, MD   BP 181/83  Pulse 81  Temp(Src) 98.3 F (36.8 C) (Oral)   Resp 22  SpO2 100% Physical Exam  Nursing note and vitals reviewed.  68 year old female, resting comfortably and in no acute distress. Vital signs are significant for tachypnea with respiratory rate of 22, and hypertension with blood pressure 181/83. Oxygen saturation is 100%, which is normal. Head is normocephalic and atraumatic. PERRLA, EOMI. Oropharynx is clear. Neck is nontender and supple without adenopathy or JVD. Tracheostomy is in place. Back is nontender and there is no CVA tenderness. Lungs are clear without rales, wheezes, or rhonchi. Chest is nontender. Heart has regular rate and rhythm without murmur. Abdomen is soft, flat, with moderate tenderness in the left lower abdomen. Feeding tube is present in the left upper abdomen. There is no rebound or guarding. There are no masses or hepatosplenomegaly and peristalsis is normoactive. Extremities have no cyanosis or edema. Flexion contracture is present in the right arm. AV shunt is present in the right thigh with bruit heard. Skin is warm and dry without rash. Neurologic: She is awake and will answer some simple questions, cranial nerves are intact. There is a spastic right hemiparesthesias..  ED Course  Procedures (including critical care time) Labs Review Results for orders placed during the hospital encounter of 06/26/14  CBC WITH DIFFERENTIAL      Result Value  Ref Range   WBC 6.3  4.0 - 10.5 K/uL   RBC 3.11 (*) 3.87 - 5.11 MIL/uL   Hemoglobin 8.8 (*) 12.0 - 15.0 g/dL   HCT 11.9 (*) 14.7 - 82.9 %   MCV 91.6  78.0 - 100.0 fL   MCH 28.3  26.0 - 34.0 pg   MCHC 30.9  30.0 - 36.0 g/dL   RDW 56.2 (*) 13.0 - 86.5 %   Platelets 203  150 - 400 K/uL   Neutrophils Relative % 81 (*) 43 - 77 %   Neutro Abs 5.2  1.7 - 7.7 K/uL   Lymphocytes Relative 14  12 - 46 %   Lymphs Abs 0.9  0.7 - 4.0 K/uL   Monocytes Relative 4  3 - 12 %   Monocytes Absolute 0.2  0.1 - 1.0 K/uL   Eosinophils Relative 1  0 - 5 %   Eosinophils Absolute 0.1   0.0 - 0.7 K/uL   Basophils Relative 0  0 - 1 %   Basophils Absolute 0.0  0.0 - 0.1 K/uL  COMPREHENSIVE METABOLIC PANEL      Result Value Ref Range   Sodium 138  137 - 147 mEq/L   Potassium 4.9  3.7 - 5.3 mEq/L   Chloride 90 (*) 96 - 112 mEq/L   CO2 32  19 - 32 mEq/L   Glucose, Bld 63 (*) 70 - 99 mg/dL   BUN 48 (*) 6 - 23 mg/dL   Creatinine, Ser 7.84 (*) 0.50 - 1.10 mg/dL   Calcium 69.6 (*) 8.4 - 10.5 mg/dL   Total Protein 7.4  6.0 - 8.3 g/dL   Albumin 3.8  3.5 - 5.2 g/dL   AST 14  0 - 37 U/L   ALT 13  0 - 35 U/L   Alkaline Phosphatase 899 (*) 39 - 117 U/L   Total Bilirubin 0.4  0.3 - 1.2 mg/dL   GFR calc non Af Amer 16 (*) >90 mL/min   GFR calc Af Amer 19 (*) >90 mL/min   Anion gap 16 (*) 5 - 15  LIPASE, BLOOD      Result Value Ref Range   Lipase 54  11 - 59 U/L  URINALYSIS, ROUTINE W REFLEX MICROSCOPIC      Result Value Ref Range   Color, Urine YELLOW  YELLOW   APPearance CLOUDY (*) CLEAR   Specific Gravity, Urine 1.015  1.005 - 1.030   pH 8.0  5.0 - 8.0   Glucose, UA NEGATIVE  NEGATIVE mg/dL   Hgb urine dipstick LARGE (*) NEGATIVE   Bilirubin Urine NEGATIVE  NEGATIVE   Ketones, ur NEGATIVE  NEGATIVE mg/dL   Protein, ur 295 (*) NEGATIVE mg/dL   Urobilinogen, UA 0.2  0.0 - 1.0 mg/dL   Nitrite NEGATIVE  NEGATIVE   Leukocytes, UA LARGE (*) NEGATIVE  URINE MICROSCOPIC-ADD ON      Result Value Ref Range   Squamous Epithelial / LPF MANY (*) RARE   WBC, UA TOO NUMEROUS TO COUNT  <3 WBC/hpf   RBC / HPF 7-10  <3 RBC/hpf   Bacteria, UA MANY (*) RARE   Urine-Other MICROSCOPIC EXAM PERFORMED ON UNCONCENTRATED URINE     Imaging Review Ct Abdomen Pelvis W Contrast  06/27/2014   CLINICAL DATA:  History of craniotomy, dialysis patient with abdominal pain.  EXAM: CT ABDOMEN AND PELVIS WITH CONTRAST  TECHNIQUE: Multidetector CT imaging of the abdomen and pelvis was performed using the standard protocol following bolus administration of  intravenous contrast.  CONTRAST:  OMNIPAQUE  IOHEXOL 300 MG/ML  SOLN  COMPARISON:  None.  FINDINGS: Due to patient condition, arms were scanned at side which results in artifact.  LUNG BASES: Small left pleural effusion and atelectasis, trace on the right. The heart is mildly enlarged, coronary artery calcifications. Pericardium is unremarkable.  SOLID ORGANS: The liver, spleen, gallbladder, pancreas and adrenal glands are unremarkable.  GASTROINTESTINAL TRACT: The stomach, small and large bowel are normal in course and caliber without inflammatory changes. Diffuse colonic diverticulosis. The appendix is not discretely identified, however there are no inflammatory changes in the right lower quadrant.  KIDNEYS/ URINARY TRACT: Kidneys are orthotopic, demonstrating symmetric enhancement. A trophic kidneys, with innumerable small subcentimeter cysts, superimposed 16 mm left upper pole, 13 mm left upper pole and 17 mm right upper pole cyst, with less than 2 mm linear calcifications associated with the right upper pole cyst. 7 mm calcified exophytic left lower pole renal mass. Dense exophytic left lower pole 10 mm renal mass. Delayed imaging through the kidneys demonstrates does not show contrast excretion. Urinary bladder is decompressed and unremarkable.  PERITONEUM/RETROPERITONEUM: No intraperitoneal free fluid nor free air. Aortoiliac vessels are normal in course and caliber, moderate calcific atherosclerosis. No lymphadenopathy by CT size criteria. Internal reproductive organs are nonsuspicious, calcified uterine leiomyomata.  SOFT TISSUE/OSSEOUS STRUCTURES: Severe osteopenia. Lower lumbar spondylosis. Fatty atrophy of the pelvic muscles, recommend correlation with ambulation.  IMPRESSION: No acute intra-abdominal or pelvic process. Diverticulosis without CT findings of acute diverticulitis nor bowel obstruction.  Severe osteopenia.  Small left, trace right pleural effusions.  Multiple bilateral renal cysts, some of which are indeterminate. Recommend follow-up  in 6 months. No renal contrast excretion on delayed phase consistent with history of chronic renal failure.   Electronically Signed   By: Awilda Metro   On: 06/27/2014 02:27     MDM   Final diagnoses:  Urinary tract infection without hematuria, site unspecified  End stage renal disease on dialysis    Abdominal pain of uncertain cause. I do not see evidence of serious pathology but will do full workup patient's inability to communicate well, and difficulty in getting patient from her nursing home to another facility. Old records are reviewed and she has no prior problems with abdominal complaints.  Urinalysis shows evidence of urinary tract infection but no other significant laboratory abnormalities are present. Anemia is present consistent with end-stage renal disease. CT is unremarkable. She was given a dose of ceftriaxone and is discharged with a prescription for cephalexin.  Dione Booze, MD 06/27/14 [phone]

## 2014-06-26 NOTE — ED Notes (Addendum)
Pt arrives via [facility] from [facility]. Pt was c/o LLQabd pain and [facility]'s CT machine is down so [facility]. Upon [facility] arrival they noted as very large BM under pt and pt was no longer c/o pain. Pt was at [facility] d/t sp bilat subdurals requiring craniotomy. Pt now has trache that is capped, wears 2L [place], pt able to nod and follow commands, pt has peg in LUQ. Pt is MWF dialysis, fistulogram in rt upper thigh. Pt denies pain at this time.

## 2014-06-27 ENCOUNTER — Emergency Department (HOSPITAL_COMMUNITY): Payer: Medicare Other

## 2014-06-27 DIAGNOSIS — T82598A Other mechanical complication of other cardiac and vascular devices and implants, initial encounter: Secondary | ICD-10-CM | POA: Diagnosis not present

## 2014-06-27 DIAGNOSIS — I469 Cardiac arrest, cause unspecified: Secondary | ICD-10-CM | POA: Diagnosis not present

## 2014-06-27 LAB — COMPREHENSIVE METABOLIC PANEL
ALK PHOS: 899 U/L — AB (ref 39–117)
ALT: 13 U/L (ref 0–35)
ANION GAP: 16 — AB (ref 5–15)
AST: 14 U/L (ref 0–37)
Albumin: 3.8 g/dL (ref 3.5–5.2)
BILIRUBIN TOTAL: 0.4 mg/dL (ref 0.3–1.2)
BUN: 48 mg/dL — AB (ref 6–23)
CHLORIDE: 90 meq/L — AB (ref 96–112)
CO2: 32 meq/L (ref 19–32)
Calcium: 10.6 mg/dL — ABNORMAL HIGH (ref 8.4–10.5)
Creatinine, Ser: 2.82 mg/dL — ABNORMAL HIGH (ref 0.50–1.10)
GFR calc non Af Amer: 16 mL/min — ABNORMAL LOW (ref >90)
GFR, EST AFRICAN AMERICAN: 19 mL/min — AB (ref >90)
GLUCOSE: 63 mg/dL — AB (ref 70–99)
POTASSIUM: 4.9 meq/L (ref 3.7–5.3)
SODIUM: 138 meq/L (ref 137–147)
TOTAL PROTEIN: 7.4 g/dL (ref 6.0–8.3)

## 2014-06-27 LAB — URINE MICROSCOPIC-ADD ON

## 2014-06-27 LAB — LIPASE, BLOOD: Lipase: 54 U/L (ref 11–59)

## 2014-06-27 LAB — URINALYSIS, ROUTINE W REFLEX MICROSCOPIC
BILIRUBIN URINE: NEGATIVE
GLUCOSE, UA: NEGATIVE mg/dL
Ketones, ur: NEGATIVE mg/dL
Nitrite: NEGATIVE
PROTEIN: 100 mg/dL — AB
Specific Gravity, Urine: 1.015 (ref 1.005–1.030)
UROBILINOGEN UA: 0.2 mg/dL (ref 0.0–1.0)
pH: 8 (ref 5.0–8.0)

## 2014-06-27 MED ORDER — IOHEXOL 300 MG/ML  SOLN
100.0000 mL | Freq: Once | INTRAMUSCULAR | Status: AC | PRN
  Administered 2014-06-27: 100 mL via INTRAVENOUS

## 2014-06-27 MED ORDER — DEXTROSE 5 % IV SOLN
1.0000 g | Freq: Once | INTRAVENOUS | Status: AC
  Administered 2014-06-27: 1 g via INTRAVENOUS
  Filled 2014-06-27: qty 10

## 2014-06-27 MED ORDER — CEPHALEXIN 250 MG/5ML PO SUSR: 500.0000 mg | Freq: Four times a day (QID) | ORAL | Status: AC

## 2014-06-27 NOTE — Discharge Instructions (Signed)
Urinary Tract Infection Urinary tract infections (UTIs) can develop anywhere along your urinary tract. Your urinary tract is your body's drainage system for removing wastes and extra water. Your urinary tract includes two kidneys, two ureters, a bladder, and a urethra. Your kidneys are a pair of bean-shaped organs. Each kidney is about the size of your fist. They are located below your ribs, one on each side of your spine. CAUSES Infections are caused by microbes, which are microscopic organisms, including fungi, viruses, and bacteria. These organisms are so small that they can only be seen through a microscope. Bacteria are the microbes that most commonly cause UTIs. SYMPTOMS  Symptoms of UTIs may vary by age and gender of the patient and by the location of the infection. Symptoms in young women typically include a frequent and intense urge to urinate and a painful, burning feeling in the bladder or urethra during urination. Older women and men are more likely to be tired, shaky, and weak and have muscle aches and abdominal pain. A fever may mean the infection is in your kidneys. Other symptoms of a kidney infection include pain in your back or sides below the ribs, nausea, and vomiting. DIAGNOSIS To diagnose a UTI, your caregiver will ask you about your symptoms. Your caregiver also will ask to provide a urine sample. The urine sample will be tested for bacteria and white blood cells. White blood cells are made by your body to help fight infection. TREATMENT  Typically, UTIs can be treated with medication. Because most UTIs are caused by a bacterial infection, they usually can be treated with the use of antibiotics. The choice of antibiotic and length of treatment depend on your symptoms and the type of bacteria causing your infection. HOME CARE INSTRUCTIONS  If you were prescribed antibiotics, take them exactly as your caregiver instructs you. Finish the medication even if you feel better after you  have only taken some of the medication.  Drink enough water and fluids to keep your urine clear or pale yellow.  Avoid caffeine, tea, and carbonated beverages. They tend to irritate your bladder.  Empty your bladder often. Avoid holding urine for long periods of time.  Empty your bladder before and after sexual intercourse.  After a bowel movement, women should cleanse from front to back. Use each tissue only once. SEEK MEDICAL CARE IF:   You have back pain.  You develop a fever.  Your symptoms do not begin to resolve within 3 days. SEEK IMMEDIATE MEDICAL CARE IF:   You have severe back pain or lower abdominal pain.  You develop chills.  You have nausea or vomiting.  You have continued burning or discomfort with urination. MAKE SURE YOU:   Understand these instructions.  Will watch your condition.  Will get help right away if you are not doing well or get worse. Document Released: 08/22/2005 Document Revised: 05/13/2012 Document Reviewed: 12/21/2011 [facility] Patient Information 2015 [place], [place]. This information is not intended to replace advice given to you by your health care provider. Make sure you discuss any questions you have with your health care provider.  Cephalexin oral suspension What is this medicine? CEPHALEXIN (sef a LEX in) is a cephalosporin antibiotic. It is used to treat certain kinds of bacterial infections.It will not work for colds, flu, or other viral infections. This medicine may be used for other purposes; ask your health care provider or pharmacist if you have questions. COMMON BRAND NAME(S): Biocef, Keflex, Panixine What should I tell my  health care provider before I take this medicine? They need to know if you have any of these conditions: -kidney disease -stomach or intestine problems, especially colitis -an unusual or allergic reaction to cephalexin, other cephalosporins, penicillins, other antibiotics, medicines, foods, dyes or  preservatives -pregnant or trying to get pregnant -breast-feeding How should I use this medicine? Take this medicine by mouth. Follow the directions on your prescription label. Shake well before using. Use a specially marked spoon or container to measure your medicine. Ask your pharmacist if you do not have one. Household spoons are not accurate. You can take this medicine with food or on an empty stomach. If the medicine upsets your stomach, take it with food. Do not take your medicine more often than directed. Finish the full course prescribed by your doctor or health care professional even if you think your condition is better. Talk to your pediatrician regarding the use of this medicine in children. While this drug may be prescribed for selected conditions, precautions do apply. Overdosage: If you think you have taken too much of this medicine contact a poison control center or emergency room at once. NOTE: This medicine is only for you. Do not share this medicine with others. What if I miss a dose? If you miss a dose, take it as soon as you can. If it is almost time for your next dose, take only that dose. Do not take double or extra doses. There should be at least 4 to 6 hours between doses. What may interact with this medicine? -probenecid -some other antibiotics This list may not describe all possible interactions. Give your health care provider a list of all the medicines, herbs, non-prescription drugs, or dietary supplements you use. Also tell them if you smoke, drink alcohol, or use illegal drugs. Some items may interact with your medicine. What should I watch for while using this medicine? Tell your doctor or health care professional if your symptoms do not begin to improve in a few days. Do not treat diarrhea with over the counter products. Contact your doctor if you have diarrhea that lasts more than 2 days or if it is severe and watery. If you have diabetes, you may get a  false-positive result for sugar in your urine. Check with your doctor or health care professional. What side effects may I notice from receiving this medicine? Side effects that you should report to your doctor or health care professional as soon as possible: -allergic reactions like skin rash, itching or hives, swelling of the face, lips, or tongue -breathing problems -pain or difficulty passing urine -redness, blistering, peeling or loosening of the skin, including inside the mouth -severe or watery diarrhea -unusually weak or tired -yellowing of the eyes, skin Side effects that usually do not require medical attention (report to your doctor or health care professional if they continue or are bothersome): -gas or heartburn -genital or anal irritation -headache -joint or muscle pain -nausea, vomiting This list may not describe all possible side effects. Call your doctor for medical advice about side effects. You may report side effects to FDA at 1-800-FDA-1088. Where should I keep my medicine? Keep out of the reach of children. After this medicine is mixed by your pharmacist, store it in the refrigerator. Do not freeze. Throw away any unused medicine after 14 days. NOTE: This sheet is a summary. It may not cover all possible information. If you have questions about this medicine, talk to your doctor, pharmacist, or health care provider.  2015, [employer]. (2008-02-16 17:10:55)

## 2014-06-27 NOTE — ED Notes (Signed)
Paged IV team 

## 2014-06-28 LAB — URINE CULTURE
Colony Count: NO GROWTH
Culture: NO GROWTH

## 2014-06-29 ENCOUNTER — Inpatient Hospital Stay (HOSPITAL_COMMUNITY): Payer: Medicare Other

## 2014-06-29 ENCOUNTER — Encounter (HOSPITAL_COMMUNITY): Payer: Self-pay

## 2014-06-29 ENCOUNTER — Inpatient Hospital Stay (HOSPITAL_COMMUNITY)
Admission: EM | Admit: 2014-06-29 | Discharge: 2014-07-04 | DRG: 252 | Disposition: A | Discharge status: Skilled Nursing Facility | Payer: Medicare Other | Source: Other Acute Inpatient Hospital | Attending: Internal Medicine | Admitting: Internal Medicine

## 2014-06-29 DIAGNOSIS — Z430 Encounter for attention to tracheostomy: Secondary | ICD-10-CM | POA: Diagnosis not present

## 2014-06-29 DIAGNOSIS — Z9911 Dependence on respirator [ventilator] status: Secondary | ICD-10-CM | POA: Diagnosis not present

## 2014-06-29 DIAGNOSIS — A0472 Enterocolitis due to Clostridium difficile, not specified as recurrent: Secondary | ICD-10-CM | POA: Diagnosis present

## 2014-06-29 DIAGNOSIS — Z823 Family history of stroke: Secondary | ICD-10-CM | POA: Diagnosis not present

## 2014-06-29 DIAGNOSIS — D62 Acute posthemorrhagic anemia: Secondary | ICD-10-CM | POA: Diagnosis present

## 2014-06-29 DIAGNOSIS — J15212 Pneumonia due to Methicillin resistant Staphylococcus aureus: Secondary | ICD-10-CM

## 2014-06-29 DIAGNOSIS — E1129 Type 2 diabetes mellitus with other diabetic kidney complication: Secondary | ICD-10-CM | POA: Diagnosis present

## 2014-06-29 DIAGNOSIS — D631 Anemia in chronic kidney disease: Secondary | ICD-10-CM | POA: Diagnosis present

## 2014-06-29 DIAGNOSIS — Z8249 Family history of ischemic heart disease and other diseases of the circulatory system: Secondary | ICD-10-CM | POA: Diagnosis not present

## 2014-06-29 DIAGNOSIS — Z86718 Personal history of other venous thrombosis and embolism: Secondary | ICD-10-CM | POA: Diagnosis not present

## 2014-06-29 DIAGNOSIS — Z992 Dependence on renal dialysis: Secondary | ICD-10-CM | POA: Diagnosis not present

## 2014-06-29 DIAGNOSIS — I120 Hypertensive chronic kidney disease with stage 5 chronic kidney disease or end stage renal disease: Secondary | ICD-10-CM | POA: Diagnosis present

## 2014-06-29 DIAGNOSIS — J9620 Acute and chronic respiratory failure, unspecified whether with hypoxia or hypercapnia: Secondary | ICD-10-CM

## 2014-06-29 DIAGNOSIS — R7881 Bacteremia: Secondary | ICD-10-CM | POA: Diagnosis present

## 2014-06-29 DIAGNOSIS — G9340 Encephalopathy, unspecified: Secondary | ICD-10-CM | POA: Diagnosis present

## 2014-06-29 DIAGNOSIS — R578 Other shock: Secondary | ICD-10-CM | POA: Diagnosis present

## 2014-06-29 DIAGNOSIS — Z87820 Personal history of traumatic brain injury: Secondary | ICD-10-CM | POA: Diagnosis not present

## 2014-06-29 DIAGNOSIS — I38 Endocarditis, valve unspecified: Secondary | ICD-10-CM | POA: Diagnosis present

## 2014-06-29 DIAGNOSIS — Z930 Tracheostomy status: Secondary | ICD-10-CM

## 2014-06-29 DIAGNOSIS — G40909 Epilepsy, unspecified, not intractable, without status epilepticus: Secondary | ICD-10-CM | POA: Diagnosis present

## 2014-06-29 DIAGNOSIS — Z87891 Personal history of nicotine dependence: Secondary | ICD-10-CM | POA: Diagnosis not present

## 2014-06-29 DIAGNOSIS — Z833 Family history of diabetes mellitus: Secondary | ICD-10-CM | POA: Diagnosis not present

## 2014-06-29 DIAGNOSIS — Z79899 Other long term (current) drug therapy: Secondary | ICD-10-CM | POA: Diagnosis not present

## 2014-06-29 DIAGNOSIS — B9562 Methicillin resistant Staphylococcus aureus infection as the cause of diseases classified elsewhere: Secondary | ICD-10-CM | POA: Diagnosis present

## 2014-06-29 DIAGNOSIS — T82898A Other specified complication of vascular prosthetic devices, implants and grafts, initial encounter: Secondary | ICD-10-CM

## 2014-06-29 DIAGNOSIS — G4737 Central sleep apnea in conditions classified elsewhere: Secondary | ICD-10-CM | POA: Diagnosis present

## 2014-06-29 DIAGNOSIS — E669 Obesity, unspecified: Secondary | ICD-10-CM | POA: Diagnosis present

## 2014-06-29 DIAGNOSIS — N2581 Secondary hyperparathyroidism of renal origin: Secondary | ICD-10-CM | POA: Diagnosis present

## 2014-06-29 DIAGNOSIS — N186 End stage renal disease: Secondary | ICD-10-CM

## 2014-06-29 DIAGNOSIS — T82598A Other mechanical complication of other cardiac and vascular devices and implants, initial encounter: Secondary | ICD-10-CM | POA: Diagnosis present

## 2014-06-29 DIAGNOSIS — Z8673 Personal history of transient ischemic attack (TIA), and cerebral infarction without residual deficits: Secondary | ICD-10-CM | POA: Diagnosis not present

## 2014-06-29 DIAGNOSIS — A4902 Methicillin resistant Staphylococcus aureus infection, unspecified site: Secondary | ICD-10-CM

## 2014-06-29 DIAGNOSIS — I469 Cardiac arrest, cause unspecified: Secondary | ICD-10-CM | POA: Diagnosis present

## 2014-06-29 DIAGNOSIS — Z931 Gastrostomy status: Secondary | ICD-10-CM | POA: Diagnosis not present

## 2014-06-29 DIAGNOSIS — N039 Chronic nephritic syndrome with unspecified morphologic changes: Secondary | ICD-10-CM | POA: Diagnosis present

## 2014-06-29 DIAGNOSIS — E876 Hypokalemia: Secondary | ICD-10-CM | POA: Diagnosis not present

## 2014-06-29 DIAGNOSIS — Z6830 Body mass index (BMI) 30.0-30.9, adult: Secondary | ICD-10-CM | POA: Diagnosis not present

## 2014-06-29 DIAGNOSIS — J9621 Acute and chronic respiratory failure with hypoxia: Secondary | ICD-10-CM | POA: Diagnosis present

## 2014-06-29 DIAGNOSIS — N058 Unspecified nephritic syndrome with other morphologic changes: Secondary | ICD-10-CM | POA: Diagnosis present

## 2014-06-29 HISTORY — DX: Unspecified cataract: H26.9

## 2014-06-29 HISTORY — DX: End stage renal disease: N18.6

## 2014-06-29 HISTORY — DX: Anemia, unspecified: D64.9

## 2014-06-29 HISTORY — DX: Primary central sleep apnea: G47.31

## 2014-06-29 HISTORY — DX: Methicillin resistant Staphylococcus aureus infection, unspecified site: A49.02

## 2014-06-29 HISTORY — DX: Traumatic subdural hemorrhage with loss of consciousness of unspecified duration, initial encounter: S06.5X9A

## 2014-06-29 HISTORY — DX: Epilepsy, unspecified, not intractable, without status epilepticus: G40.909

## 2014-06-29 HISTORY — DX: Bacteremia: R78.81

## 2014-06-29 HISTORY — DX: Ventricular tachycardia, unspecified: I47.20

## 2014-06-29 HISTORY — DX: Ventricular tachycardia: I47.2

## 2014-06-29 HISTORY — DX: Secondary hyperparathyroidism of renal origin: N25.81

## 2014-06-29 HISTORY — DX: Traumatic subdural hemorrhage with loss of consciousness status unknown, initial encounter: S06.5XAA

## 2014-06-29 HISTORY — DX: Methicillin resistant Staphylococcus aureus infection as the cause of diseases classified elsewhere: B95.62

## 2014-06-29 HISTORY — DX: Enterocolitis due to Clostridium difficile, not specified as recurrent: A04.72

## 2014-06-29 LAB — LACTIC ACID, PLASMA
Lactic Acid, Venous: 7.1 mmol/L — ABNORMAL HIGH (ref 0.5–2.2)
Lactic Acid, Venous: 5 mmol/L — ABNORMAL HIGH (ref 0.5–2.2)
Lactic Acid, Venous: 3.5 mmol/L — ABNORMAL HIGH (ref 0.5–2.2)

## 2014-06-29 LAB — COMPREHENSIVE METABOLIC PANEL
ALBUMIN: 2.7 g/dL — AB (ref 3.5–5.2)
ALT: 27 U/L (ref 0–35)
ANION GAP: 22 — AB (ref 5–15)
AST: 31 U/L (ref 0–37)
Alkaline Phosphatase: 779 U/L — ABNORMAL HIGH (ref 39–117)
BILIRUBIN TOTAL: 0.3 mg/dL (ref 0.3–1.2)
BUN: 93 mg/dL — AB (ref 6–23)
CALCIUM: 8.9 mg/dL (ref 8.4–10.5)
CHLORIDE: 89 meq/L — AB (ref 96–112)
CO2: 22 mEq/L (ref 19–32)
CREATININE: 5.5 mg/dL — AB (ref 0.50–1.10)
GFR calc Af Amer: 8 mL/min — ABNORMAL LOW (ref >90)
GFR calc non Af Amer: 7 mL/min — ABNORMAL LOW (ref >90)
Glucose, Bld: 219 mg/dL — ABNORMAL HIGH (ref 70–99)
Potassium: 4.7 mEq/L (ref 3.7–5.3)
Sodium: 133 mEq/L — ABNORMAL LOW (ref 137–147)
Total Protein: 5.2 g/dL — ABNORMAL LOW (ref 6.0–8.3)

## 2014-06-29 LAB — CBC
HCT: 21.4 % — ABNORMAL LOW (ref 36.0–46.0)
Hemoglobin: 6.9 g/dL — CL (ref 12.0–15.0)
MCH: 28 pg (ref 26.0–34.0)
MCHC: 32.2 g/dL (ref 30.0–36.0)
MCV: 87 fL (ref 78.0–100.0)
Platelets: 257 10*3/uL (ref 150–400)
RBC: 2.46 MIL/uL — AB (ref 3.87–5.11)
RDW: 19 % — ABNORMAL HIGH (ref 11.5–15.5)
WBC: 21.3 10*3/uL — AB (ref 4.0–10.5)

## 2014-06-29 LAB — PHOSPHORUS: Phosphorus: 4.3 mg/dL (ref 2.3–4.6)

## 2014-06-29 LAB — TROPONIN I

## 2014-06-29 LAB — CLOSTRIDIUM DIFFICILE BY PCR: Toxigenic C. Difficile by PCR: NEGATIVE

## 2014-06-29 LAB — GLUCOSE, CAPILLARY: GLUCOSE-CAPILLARY: 237 mg/dL — AB (ref 70–99)

## 2014-06-29 LAB — MAGNESIUM: MAGNESIUM: 2.1 mg/dL (ref 1.5–2.5)

## 2014-06-29 LAB — MRSA PCR SCREENING: MRSA by PCR: NEGATIVE

## 2014-06-29 MED ORDER — VANCOMYCIN 50 MG/ML ORAL SOLUTION
125.0000 mg | Freq: Four times a day (QID) | ORAL | Status: DC
  Administered 2014-06-29 – 2014-07-04 (×19): 125 mg via ORAL
  Filled 2014-06-29 (×23): qty 2.5

## 2014-06-29 MED ORDER — NOREPINEPHRINE BITARTRATE 1 MG/ML IV SOLN
2.0000 ug/min | INTRAVENOUS | Status: DC
  Administered 2014-06-29: 12 ug/min via INTRAVENOUS
  Administered 2014-06-30: 8 ug/min via INTRAVENOUS
  Administered 2014-06-30: 10 ug/min via INTRAVENOUS
  Administered 2014-06-30: 12 ug/min via INTRAVENOUS
  Filled 2014-06-29 (×4): qty 4

## 2014-06-29 MED ORDER — FAMOTIDINE 40 MG/5ML PO SUSR
20.0000 mg | Freq: Two times a day (BID) | ORAL | Status: DC
  Administered 2014-06-29 (×2): 20 mg
  Filled 2014-06-29 (×4): qty 2.5

## 2014-06-29 MED ORDER — FENTANYL CITRATE 0.05 MG/ML IJ SOLN
50.0000 ug | INTRAMUSCULAR | Status: DC | PRN
  Administered 2014-07-04: 50 ug via INTRAVENOUS
  Filled 2014-06-29: qty 2

## 2014-06-29 MED ORDER — HYDROCORTISONE NA SUCCINATE PF 100 MG IJ SOLR
50.0000 mg | Freq: Four times a day (QID) | INTRAMUSCULAR | Status: DC
  Administered 2014-06-29 – 2014-07-01 (×9): 50 mg via INTRAVENOUS
  Filled 2014-06-29 (×12): qty 1

## 2014-06-29 MED ORDER — HEPARIN SODIUM (PORCINE) 1000 UNIT/ML IJ SOLN
4000.0000 [IU] | Freq: Once | INTRAMUSCULAR | Status: AC
  Administered 2014-06-29: 4000 [IU]
  Filled 2014-06-29 (×2): qty 4

## 2014-06-29 MED ORDER — IPRATROPIUM-ALBUTEROL 0.5-2.5 (3) MG/3ML IN SOLN: 3.0000 mL | Freq: Four times a day (QID) | RESPIRATORY_TRACT | Status: DC | PRN

## 2014-06-29 MED ORDER — HEPARIN SODIUM (PORCINE) 5000 UNIT/ML IJ SOLN
5000.0000 [IU] | Freq: Three times a day (TID) | INTRAMUSCULAR | Status: DC
  Administered 2014-06-29 – 2014-07-01 (×6): 5000 [IU] via SUBCUTANEOUS
  Filled 2014-06-29 (×9): qty 1

## 2014-06-29 MED ORDER — IPRATROPIUM-ALBUTEROL 0.5-2.5 (3) MG/3ML IN SOLN: 3.0000 mL | RESPIRATORY_TRACT | Status: DC | PRN

## 2014-06-29 MED ORDER — LEVETIRACETAM 100 MG/ML PO SOLN
250.0000 mg | Freq: Two times a day (BID) | ORAL | Status: DC
  Administered 2014-06-29 – 2014-07-04 (×10): 250 mg
  Filled 2014-06-29 (×12): qty 2.5

## 2014-06-29 MED ORDER — VANCOMYCIN 50 MG/ML ORAL SOLUTION: 125.0000 mg | ORAL | Status: DC

## 2014-06-29 MED ORDER — VANCOMYCIN 50 MG/ML ORAL SOLUTION: 125.0000 mg | Freq: Every day | ORAL | Status: DC

## 2014-06-29 MED ORDER — VANCOMYCIN 50 MG/ML ORAL SOLUTION: 125.0000 mg | Freq: Two times a day (BID) | ORAL | Status: DC

## 2014-06-29 MED ORDER — SODIUM CHLORIDE 0.9 % IV SOLN: 250.0000 mL | INTRAVENOUS | Status: DC | PRN

## 2014-06-29 MED ORDER — SODIUM CHLORIDE 0.9 % IV SOLN
INTRAVENOUS | Status: DC
  Administered 2014-06-29 – 2014-06-30 (×2): via INTRAVENOUS

## 2014-06-29 MED ORDER — ACETAMINOPHEN 160 MG/5ML PO SOLN: 650.0000 mg | Freq: Four times a day (QID) | ORAL | Status: DC | PRN

## 2014-06-29 NOTE — Consult Note (Signed)
[facility] [place] HISTORY AND PHYSICAL  Requesting: Dr Craige Cotta Critical care Reason for consult: Bleeding right thigh graft  History of Present Illness:  Patient is a 68 y.o. year old female who presents for evaluation of bleeding from left thigh graft. The patient was bleeding at [facility] today to the point of cardiac arrest.  The patient was resuscitated and transferred to [facility] under the care of the critical care service for further management. The patient is a chronic patient at [facility].  She has been intermittently vent dependent for several months and has never completely recovered from a subdural hematoma after a fall several months ago.  She had some type of intervention on her right thigh graft at [facility] recently but apparently there were no beds available there today to accept the patient. 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  Her right thigh graft was placed in 2009.  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  She has had multiple thrombectomy/revisions in interventional in the past here and at [facility].  She is currently not bleeding from the graft.  She is on Levophed 25 cc/hr with BP 80-100.  Other medical problems include hypertension, ESRD (T Th Sat), prior stroke, seizures, c diff, sleep apnea, v tach.  All of these have been stable but she does have active diarrhea.  Past Medical History  Diagnosis Date  . Hypertension   . ESRD (end stage renal disease)   . Stroke   . Diabetes mellitus   . Subdural hematoma   . Seizure disorder   . Anemia   . Secondary hyperparathyroidism   . Cataract   . MRSA (methicillin resistant Staphylococcus aureus)   . C. difficile colitis   . MRSA bacteremia   . Central sleep apnea   . Ventricular tachycardia     Past Surgical History  Procedure Laterality Date  . Cholecystectomy    . Vascular surgery    . Thyroidectomy    . Eye surgery    .  Craniotomy      Social History History  Substance Use Topics  . Smoking status: Former Smoker -- 1.50 packs/day for 20 years    Types: Cigarettes    Quit date: 11/26/1998  . Smokeless tobacco: Not on file  . Alcohol Use: No    Family History Family History  Problem Relation Age of Onset  . Hypertension Mother   . Diabetes Mellitus II Mother   . Stroke Father   . Hypertension Father     Allergies  Allergies  Allergen Reactions  . Penicillins Hives and Rash     Current Facility-Administered Medications  Medication Dose Route Frequency Provider Last Rate Last Dose  . 0.9 %  sodium chloride infusion  250 mL Intravenous PRN Rahul P Desai, PA-C      . 0.9 %  sodium chloride infusion   Intravenous Continuous Coralyn Helling, MD 50 mL/hr at 06/29/14 1322    . acetaminophen (TYLENOL) solution 650 mg  650 mg Per Tube Q6H PRN Coralyn Helling, MD      . famotidine (PEPCID) 40 MG/5ML suspension 20 mg  20 mg Per Tube BID Rahul P Desai, PA-C   20 mg at 06/29/14 1324  . fentaNYL (SUBLIMAZE) injection 50 mcg  50 mcg Intravenous Q2H PRN Coralyn Helling, MD      . heparin injection 5,000 Units  5,000 Units Subcutaneous 3 times per day Rahul P  Celine Mans, PA-C      . hydrocortisone sodium succinate (SOLU-CORTEF) 100 MG injection 50 mg  50 mg Intravenous Q6H Rahul P Desai, PA-C   50 mg at 06/29/14 1323  . ipratropium-albuterol (DUONEB) 0.5-2.5 (3) MG/3ML nebulizer solution 3 mL  3 mL Nebulization Q2H PRN Coralyn Helling, MD      . levETIRAcetam (KEPPRA) 100 MG/ML solution 250 mg  250 mg Per Tube BID Rahul P Desai, PA-C   250 mg at 06/29/14 1322  . norepinephrine (LEVOPHED) 4 mg in dextrose 5 % 250 mL infusion  2-50 mcg/min Intravenous Titrated Coralyn Helling, MD 30 mL/hr at 06/29/14 1349 8 mcg/min at 06/29/14 1349  . vancomycin (VANCOCIN) 50 mg/mL oral solution 125 mg  125 mg Oral QID Coralyn Helling, MD       Followed by  . [START ON 07/06/2014] vancomycin (VANCOCIN) 50 mg/mL oral solution 125 mg  125 mg Oral BID Coralyn Helling, MD       Followed by  . [START ON 07/14/2014] vancomycin (VANCOCIN) 50 mg/mL oral solution 125 mg  125 mg Oral Daily Coralyn Helling, MD       Followed by  . [START ON 07/21/2014] vancomycin (VANCOCIN) 50 mg/mL oral solution 125 mg  125 mg Oral QODAY Coralyn Helling, MD       Followed by  . [START ON 07/29/2014] vancomycin (VANCOCIN) 50 mg/mL oral solution 125 mg  125 mg Oral Q3 days Coralyn Helling, MD        ROS:   Unable to obtain.  Pt on ventilator and not responding to commands  Physical Examination  Filed Vitals:   06/29/14 1055 06/29/14 1100 06/29/14 1202  BP:  122/78   Pulse: 90 25   Temp:   97.8 F (36.6 C)  TempSrc:   Oral  Resp: 18 7   Height:  5\' 5"  (1.651 m)   Weight:  181 lb 3.5 oz (82.2 kg)   SpO2: 99% 96%     Body mass index is 30.16 kg/(m^2).  General: Pt on vent, opens eyes, does not interact with environment HEENT: Normal Pulmonary: On ventilator sat > 95% Cardiac: Regular Rate and Rhythm Abdomen: Soft, non-tender Skin: No rash, ulcerated erupting ulcer medial aspect of right thigh graft no active bleeding, right limb of graft, 2 areas of bolstered suture, no erythema, no fluctuance Extremity Pulses:  Absent dorsalis pedis, posterior tibial pulses bilaterally, no audible bruit in graft, left femoral venous catheter Neurologic: does not follow commands  DATA:   CBC    Component Value Date/Time   WBC 21.3* 06/29/2014 1114   RBC 2.46* 06/29/2014 1114   HGB 6.9* 06/29/2014 1114   HCT 21.4* 06/29/2014 1114   PLT 257 06/29/2014 1114   MCV 87.0 06/29/2014 1114   MCH 28.0 06/29/2014 1114   MCHC 32.2 06/29/2014 1114   RDW 19.0* 06/29/2014 1114   LYMPHSABS 0.9 06/26/2014 2300   MONOABS 0.2 06/26/2014 2300   EOSABS 0.1 06/26/2014 2300   BASOSABS 0.0 06/26/2014 2300     BMET    Component Value Date/Time   NA 138 06/26/2014 2300   K 4.9 06/26/2014 2300   CL 90* 06/26/2014 2300   CO2 32 06/26/2014 2300   GLUCOSE 63* 06/26/2014 2300   BUN 48* 06/26/2014 2300   CREATININE 2.82* 06/26/2014 2300    CALCIUM 10.6* 06/26/2014 2300   GFRNONAA 16* 06/26/2014 2300   GFRAA 19* 06/26/2014 2300       ASSESSMENT: s/p hemorrhage from right thigh graft,  graft now occluded so risk of further bleeding should be low.  Pt currently on Levophed to support BP, anemic hemoglobin 6.9.  Leukocytosis reactive vs infection C diff etc.  Pt is currently too unstable to consider any revision of her thigh graft.   PLAN:  Discussed with Dr Craige CottaSood patient currently at low risk of rebleed with graft occlusion.  They will place temporary dialysis catheter in left groin.  If the patient becomes stable and off pressors will reassess for revision/thrombectomy of thigh graft.  If her course becomes prolonged may be better served with tunneled dialysis catheter in left groin.  Will follow  Fabienne Brunsharles Rola Lennon, MD [facility] [place] Office: [phone] Pager: [phone]

## 2014-06-29 NOTE — Procedures (Signed)
Hemodialysis Catheter Insertion Procedure Note [name] [id number] 1946/02/06  Procedure: Insertion of Hemodialysis Catheter Indications: Hemodialysis  Procedure Details Consent: Unable to obtain consent because of altered level of consciousness. Time Out: Verified patient identification, verified procedure, site/side was marked, verified correct patient position, special equipment/implants available, medications/allergies/relevent history reviewed, required imaging and test results available.  Performed  Maximum sterile technique was used including antiseptics, cap, gloves, gown, hand hygiene, mask and sheet. Skin prep: Chlorhexidine; local anesthetic administered A antimicrobial bonded/coated triple lumen catheter was placed in the left femoral vein due to patient being a dialysis patient using the Seldinger technique.  Evaluation Blood flow good Complications: No apparent complications Patient did tolerate procedure well.  Pt had left femoral TLC placed earlier in day while at [facility].  HD cath was exchanged for TLC so that pt could undergo HD treatment today.   Christina Guysahul Rewa Weissberg, PA - [facility] Pgr: [phone]  or [phone]

## 2014-06-29 NOTE — Consult Note (Signed)
DARCEL ZICK is an 68 y.o. female referred by Dr Craige Cotta   Chief Complaint: ESRD, anemia HPI: 68yo BF transferred from [facility] following a cardiac arrest due to acute blood loss from her Rt thigh AVG.  Reportedly she recently had a proceedure done at [facility] on her AVG though details are unknown.  She has been at [facility] since 7/14 following subdural hematoma.  She has ESRD and is on TTS schedule.  K yest 4.8.  Labs pending for today  Past Medical History  Diagnosis Date  . Hypertension   . ESRD (end stage renal disease)   . Stroke   . Diabetes mellitus   . Subdural hematoma   . Seizure disorder   . Anemia   . Secondary hyperparathyroidism   . Cataract   . MRSA (methicillin resistant Staphylococcus aureus)   . C. difficile colitis   . MRSA bacteremia   . Central sleep apnea   . Ventricular tachycardia     Past Surgical History  Procedure Laterality Date  . Cholecystectomy    . Vascular surgery    . Thyroidectomy    . Eye surgery    . Craniotomy      Family History  Problem Relation Age of Onset  . Hypertension Mother   . Diabetes Mellitus II Mother   . Stroke Father   . Hypertension Father    Social History:  reports that she quit smoking about 15 years ago. Her smoking use included Cigarettes. She has a 30 pack-year smoking history. She does not have any smokeless tobacco history on file. She reports that she does not drink alcohol or use illicit drugs.  Allergies:  Allergies  Allergen Reactions  . Penicillins Hives and Rash    Medications Prior to Admission  Medication Sig Dispense Refill  . acetaminophen (TYLENOL) 160 MG/5ML solution Place 650 mg into feeding tube every 6 (six) hours as needed for mild pain or fever.      . cephALEXin (KEFLEX) 250 MG/5ML suspension Place 10 mLs (500 mg total) into feeding tube 4 (four) times daily.  400 mL  0  . chlorhexidine (PERIDEX) 0.12 % solution Use as directed 15 mLs in the mouth or throat every 12 (twelve) hours.       . darbepoetin (ARANESP) 60 MCG/0.3ML SOLN injection Inject 60 mcg into the skin every 7 (seven) days.      Marland Kitchen dexamethasone (DECADRON) 4 MG tablet Give 4 mg by tube daily.      Marland Kitchen ipratropium-albuterol (DUONEB) 0.5-2.5 (3) MG/3ML SOLN Take 3 mLs by nebulization every 6 (six) hours as needed (for shortness of breath).      . levETIRAcetam (KEPPRA) 100 MG/ML solution Place 250 mg into feeding tube every 12 (twelve) hours.      . midodrine (PROAMATINE) 5 MG tablet Give 10 mg by tube every other day as needed (per [facility]      . modafinil (PROVIGIL) 200 MG tablet Take 200 mg by mouth daily.      . ondansetron (ZOFRAN) 4 MG/2ML SOLN injection Inject 4 mg into the vein every 6 (six) hours as needed for nausea or vomiting.      . polyethylene glycol (MIRALAX / GLYCOLAX) packet Take 17 g by mouth daily as needed for mild constipation.      . polyvinyl alcohol (LIQUIFILM TEARS) 1.4 % ophthalmic solution Place 1 drop into both eyes as needed for dry eyes.      Marland Kitchen UNABLE TO FIND Give 1 Bottle by  tube continuous. Med Name: Novasource Renal- 2045ml/hour continuous      . Vancomycin HCl (VANCOCIN HCL PO) Take 125 Tubes by mouth every 6 (six) hours.      . Vitamins A & D (VITAMIN A & D) ointment Apply 1 application topically every 12 (twelve) hours.         Lab Results: UA: ND   Recent Labs  06/26/14 2300  WBC 6.3  HGB 8.8*  HCT 28.5*  PLT 203   BMET  Recent Labs  06/26/14 2300  NA 138  K 4.9  CL 90*  CO2 32  GLUCOSE 63*  BUN 48*  CREATININE 2.82*  CALCIUM 10.6*   LFT  Recent Labs  06/26/14 2300  PROT 7.4  ALBUMIN 3.8  AST 14  ALT 13  ALKPHOS 899*  BILITOT 0.4   Dg Chest Port 1 View  06/29/2014   CLINICAL DATA:  Airspace disease.  EXAM: PORTABLE CHEST - 1 VIEW  COMPARISON:  None.  FINDINGS: Tracheostomy present with the tip in the trachea. Lungs show evidence of probable underlying COPD/ chronic bronchitis. There is left lower lobe atelectasis/consolidation and potentially a small  left pleural effusion. No overt edema is identified. No evidence of pneumothorax. The heart size is at the upper limits of normal. Multiple old bilateral rib fractures identified.  IMPRESSION: Probable chronic lung disease. Left lower lobe atelectasis/consolidation with potential small left pleural effusion.   Electronically Signed   By: Irish LackGlenn  Yamagata M.D.   On: 06/29/2014 12:30    ROS: Pt trached.  She denies any pain.  Unable to get detailed ROS   PHYSICAL EXAM: Blood pressure 122/78, pulse 25, temperature 97.8 F (36.6 C), temperature source Oral, resp. rate 7, height 5\' 5"  (1.651 m), weight 82.2 kg (181 lb 3.5 oz), SpO2 96.00%. HEENT: PERRLA EOMI NECK:no JVD, trach LUNGS:clear ant CARDIAC:RRR 2/6 systolic M ABD:+ BS NTND No HSM EXT:no edema  Rt thigh AVG No bruit. Both arms contractured R>L NEURO:Opens eyes and mouths words.  Follows simple commands.  Limited movement of arms Lt fem triple lumen  Assessment: 1. SP acute blood loss from AVG, currently bleeding stopped 2. SP subdural hematomas 3. VDRF 4. ESRD 5. Anemia sec ESRD and Acute blood loss PLAN: 1. Convert Lt triple lumen to trialysis cath 2. Note plans for VVS to attempt declot when stable 3. Wean levophed 4. Await labs 5. Plan HD in AM unless K high   Julissa Browning T 06/29/2014, 1:47 PM

## 2014-06-29 NOTE — H&P (Signed)
PULMONARY / CRITICAL CARE MEDICINE   Name: [name] MRN: [id number] DOB: Feb 19, 1946    ADMISSION DATE:  06/29/2014 CONSULTATION DATE:  06/29/2014  REFERRING MD :  [facility]  CHIEF COMPLAINT:  Cardiac arrest s/p right groin AV fistula/graft exanguination  INITIAL PRESENTATION: 68 y.o. F from [facility] after she suffered a cardiac arrest when her right thigh AV fistula/graft bled out.  STUDIES:  8/1 CT Abd >>> no acute process, diverticulosis without acute diverticulitis, small b/l pleural effusions, multiple bilateral renal cysts.  SIGNIFICANT EVENTS: 8/[facility] for abd pain 8/4 right groin AV fistula/graft bled out which led to cardiac arrest, [facility].   HISTORY OF PRESENT ILLNESS:  [name] is a 68 y.o. 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  On 8/4, her right thigh AV fistula/graft began bleeding profusely.  This unfortunately led to a cardiac arrest.  Pt was successfully resuscitated and was [facility] evaluation/workup.  Of note, pt's daughter states that right thigh has bled several times over the past few months and in fact, [facility] who dilated the graft recently.   PAST MEDICAL HISTORY :  Past Medical History  Diagnosis Date  . Hypertension   . ESRD (end stage renal disease)   . Stroke   . Diabetes mellitus   . Subdural hematoma   . Seizure disorder   . Anemia   . Secondary hyperparathyroidism   . Cataract   . MRSA (methicillin resistant Staphylococcus aureus)   . C. difficile colitis   . MRSA bacteremia   . Central sleep apnea   . Ventricular tachycardia    Past Surgical History  Procedure Laterality Date  . Cholecystectomy    . Vascular surgery    . Thyroidectomy    . Eye surgery    . Craniotomy     Prior to Admission medications   Medication Sig Start Date End Date Taking?  Authorizing Provider  acetaminophen (TYLENOL) 160 MG/5ML solution Place 650 mg into feeding tube every 6 (six) hours as needed for mild pain or fever.    Historical Provider, MD  cephALEXin (KEFLEX) 250 MG/5ML suspension Place 10 mLs (500 mg total) into feeding tube 4 (four) times daily. 06/27/14 07/04/14  Dione Boozeavid Glick, MD  chlorhexidine (PERIDEX) 0.12 % solution Use as directed 15 mLs in the mouth or throat every 12 (twelve) hours.    Historical Provider, MD  darbepoetin (ARANESP) 60 MCG/0.3ML SOLN injection Inject 60 mcg into the skin every 7 (seven) days.    Historical Provider, MD  dexamethasone (DECADRON) 4 MG tablet Give 4 mg by tube daily.    Historical Provider, MD  ipratropium-albuterol (DUONEB) 0.5-2.5 (3) MG/3ML SOLN Take 3 mLs by nebulization every 6 (six) hours as needed (for shortness of breath).    Historical Provider, MD  levETIRAcetam (KEPPRA) 100 MG/ML solution Place 250 mg into feeding tube every 12 (twelve) hours.    Historical Provider, MD  midodrine (PROAMATINE) 5 MG tablet Give 10 mg by tube every other day as needed (per [facility]).    Historical Provider, MD  modafinil (PROVIGIL) 200 MG tablet Take 200 mg by mouth daily.    Historical Provider, MD  ondansetron (ZOFRAN) 4 MG/2ML SOLN injection Inject 4 mg into the vein every 6 (six) hours as needed for nausea or vomiting.    Historical  Provider, MD  polyethylene glycol (MIRALAX / GLYCOLAX) packet Take 17 g by mouth daily as needed for mild constipation.    Historical Provider, MD  polyvinyl alcohol (LIQUIFILM TEARS) 1.4 % ophthalmic solution Place 1 drop into both eyes as needed for dry eyes.    Historical Provider, MD  UNABLE TO FIND Give 1 Bottle by tube continuous. Med Name: Novasource Renal- 36ml/hour continuous    Historical Provider, MD  Vancomycin HCl (VANCOCIN HCL PO) Take 125 Tubes by mouth every 6 (six) hours.    Historical Provider, MD  Vitamins A & D (VITAMIN A & D) ointment Apply 1 application topically every 12 (twelve)  hours.    Historical Provider, MD   Allergies  Allergen Reactions  . Penicillins Hives and Rash    FAMILY HISTORY:  Family History  Problem Relation Age of Onset  . Hypertension Mother   . Diabetes Mellitus II Mother   . Stroke Father   . Hypertension Father    SOCIAL HISTORY:  reports that she quit smoking about 15 years ago. Her smoking use included Cigarettes. She has a 30 pack-year smoking history. She does not have any smokeless tobacco history on file. She reports that she does not drink alcohol or use illicit drugs.  REVIEW OF SYSTEMS:  Unable to obtain as pt is encephalopathic.  SUBJECTIVE:   VITAL SIGNS: Temp:  [97.8 F (36.6 C)] 97.8 F (36.6 C) (08/04 1202) Pulse Rate:  [25-90] 25 (08/04 1100) Resp:  [7-18] 7 (08/04 1100) BP: (122)/(78) 122/78 mmHg (08/04 1100) SpO2:  [96 %-99 %] 96 % (08/04 1100) FiO2 (%):  [80 %] 80 % (08/04 1057) Weight:  [181 lb 3.5 oz (82.2 kg)] 181 lb 3.5 oz (82.2 kg) (08/04 1100) HEMODYNAMICS:   VENTILATOR SETTINGS: Vent Mode:  [-] PRVC FiO2 (%):  [80 %] 80 % Set Rate:  [30 bmp] 30 bmp Vt Set:  [500 mL] 500 mL PEEP:  [5 cmH20] 5 cmH20 Plateau Pressure:  [16 cmH20] 16 cmH20 INTAKE / OUTPUT: No intake or output data in the 24 hours ending 06/29/14 1204  PHYSICAL EXAMINATION: General: Obese female, resting in bed, in NAD. Neuro: Follows some commands, attempts to converse. HEENT: [place]/AT. PERRL, sclerae anicteric.  Trach in place, C/D/I. Cardiovascular: RRR, no M/R/G.  Lungs: Respirations even and unlabored.  CTA bilaterally, No W/R/R. On full vent support. Abdomen: BS x 4, soft, NT/ND.  PEG in place. Musculoskeletal: No gross deformities, no edema.  Skin: Intact, warm, no rashes.   LABS:  CBC  Recent Labs Lab 06/26/14 2300  WBC 6.3  HGB 8.8*  HCT 28.5*  PLT 203   Coag's No results found for this basename: APTT, INR,  in the last 168 hours BMET  Recent Labs Lab 06/26/14 2300  NA 138  K 4.9  CL 90*  CO2 32  BUN  48*  CREATININE 2.82*  GLUCOSE 63*   Electrolytes  Recent Labs Lab 06/26/14 2300  CALCIUM 10.6*   Sepsis Markers No results found for this basename: LATICACIDVEN, PROCALCITON, O2SATVEN,  in the last 168 hours ABG No results found for this basename: PHART, PCO2ART, PO2ART,  in the last 168 hours Liver Enzymes  Recent Labs Lab 06/26/14 2300  AST 14  ALT 13  ALKPHOS 899*  BILITOT 0.4  ALBUMIN 3.8   Cardiac Enzymes No results found for this basename: TROPONINI, PROBNP,  in the last 168 hours Glucose  Recent Labs Lab 06/29/14 1100  GLUCAP 237*    Imaging No results  found.   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 MRSA PNA P:   Full vent support for now. Wean as able. Continue Vancomycin as per ID section. Duonebs. CXR now and in AM.  CARDIOVASCULAR CVL left femoral 06/29/14 A:  S/p cardiac arrest - presumed in setting right thigh AV graft hemorrhage. ? Hx of V.Tach - latest progress note from [facility] states pt is on Amiodarone and Diltiazem, though, this not seen anywhere else in chart. P:  Levophed. Goal MAP > 65. Check cortisol. Stress steroids (on Decadron as outpatient). Hold outpatient Midodrine for now. Trend troponin / lactate. Hold off on amiodarone/diltiazem for now.  RENAL A:  [facility] on dialysis T/Th/Sat - very limited access, right thigh AV fistula currently bleeding.  Bilateral IJ's have had trouble in past, left groin has femoral TLC. P:   Renal consulted. Vascular consulted. May need to exchange left femoral TLC for HD cath. BMP now and in AM.  GASTROINTESTINAL A:  GI prophylaxis Nutrition P:   Pepcid. NPO for now.  HEMATOLOGIC A:  VTE prophylaxis Chronic anemia P:  SCD's. CBC now and in AM.  INFECTIOUS A:  Blood Cx 7/18 and 7/21 >>> MRSA  Bacteremia Diarrhea P:   C.diff PCR >>> Abx: Vanc, start date 8/3 ?  ENDOCRINE A:  DM ? AI - on decadron as outpatient. P:   CBG's  q4hrs. SSI. Stress steroids.  NEUROLOGIC A:  TBI - s/p remote bilateral SDH's  Hx CVA Acute Encephalopathy Seizure disorder P:   Continue outpatient Keppra. Monitor.  TODAY'S SUMMARY: 68 y.o. F transferred from [facility] due to cardiac arrest after her right thigh AV fistula bled out.  Vascular and Renal consulted.  Currently on vent and on Levophed.  She is on vanc for MRSA bacteremia and ? C.Diff.  Rutherford Guys, PA - [facility] [place] [facility] Pgr: [phone]  or [phone]   Reviewed above, examined.  68 yo female with hx HTN, DM and ESRD had fall leading to SDH about 1 year ago.  She was in persistent vegetative state for several months.  She required tracheostomy/PEG.  She eventually regained her mental status and was changed from DNR to full code status.  Daughter reports that pt wants to "fight until the end".  She has been residing at [facility] for the past one year.  Her course there has been complicated by MRSA PNA with bacteremia and C diff colitis.  She has limited IV access (pt's daughter reports that she has nothing in her upper extremities).  She has AV graft in her Rt groin.  [facility] was set to [facility] to have shuntogram done recently.  She had persistent bleeding from graft site, and sutures placed to control bleeding.  She developed significant bleeding this AM and developed cardiac arrest and hemorrhagic shock.  She was resuscitated, given IV fluid and 1 unit PRBC, and started on levophed.  Bleeding stopped, hemodynamics stabilized, and she was transferred to [facility] for further therapy.  She is awake, and follows commands.  She is contracted, but moves extremities.  She has tracheostomy, and PEG in place.  She has pressure dressing on Rt groin >> two suture sites, unable to palpate thrill in AV graft.  She has CVL in Lt femoral site.  Will continue full vent support for now.  Will continue IV and enteral vancomycin for MRSA  PNA/Bacteremia and C diff colitis.  Wean  off pressors to keep MAP > 65 and SBP > 65.  Vascular surgery consulted to assess AV graft.  Nephrology consulted for dialysis needs.  Updated pt's daughter at bedside about plan.  CC time 90 minutes.  Coralyn HellingVineet Tareq Dwan, MD [facility] 06/29/2014, 12:15 PM Pager:  [phone] After 3pm call: [phone]

## 2014-06-29 NOTE — Procedures (Signed)
Present at bedside during the procedure.   Stephanie AcreVishal Siah Steely, MD [place] [facility] Pager [phone] On Call Pager [phone]

## 2014-06-29 NOTE — Progress Notes (Signed)
ANTIBIOTIC CONSULT NOTE - INITIAL  Pharmacy Consult for Vancomycin IV  Indication: MRSA Pneumonia  Allergies  Allergen Reactions  . Penicillins Hives and Rash   Patient Measurements: Height: 5\' 5"  (165.1 cm) Weight: 181 lb 3.5 oz (82.2 kg) IBW/kg (Calculated) : 57 Vital Signs: Temp: 97.8 F (36.6 C) (08/04 1202) Temp src: Oral (08/04 1202) BP: 122/78 mmHg (08/04 1100) Pulse Rate: 25 (08/04 1100) Intake/Output from previous day:   Intake/Output from this shift:   Labs:  Recent Labs  06/26/14 2300  WBC 6.3  HGB 8.8*  PLT 203  CREATININE 2.82*   Estimated Creatinine Clearance: 20.2 ml/min (by C-G formula based on Cr of 2.82). No results found for this basename: Rolm GalaVANCOTROUGH, VANCOPEAK, VANCORANDOM, GENTTROUGH, GENTPEAK, GENTRANDOM, TOBRATROUGH, TOBRAPEAK, TOBRARND, AMIKACINPEAK, AMIKACINTROU, AMIKACIN,  in the last 72 hours   Microbiology: Recent Results (from the past 720 hour(s))  URINE CULTURE     Status: None   Collection Time    06/26/14 11:35 PM      Result Value Ref Range Status   Specimen Description URINE, CATHETERIZED   Final   Special Requests CX ADDED AT 0143 ON [id number]   Final   Culture  Setup Time     Final   Value: 06/27/2014 02:41     Performed at [facility]   Colony Count     Final   Value: NO GROWTH     Performed at [facility]   Culture     Final   Value: NO GROWTH     Performed at [facility]   Report Status 06/28/2014 FINAL   Final    Medical History: Past Medical History  Diagnosis Date  . Hypertension   . ESRD (end stage renal disease)   . Stroke   . Diabetes mellitus   . Subdural hematoma   . Seizure disorder   . Anemia   . Secondary hyperparathyroidism   . Cataract   . MRSA (methicillin resistant Staphylococcus aureus)   . C. difficile colitis   . MRSA bacteremia   . Central sleep apnea   . Ventricular tachycardia     Medications:  Linezolid 7/12 >> (utilized due to no IV access) Levaquin  7/12? >> Vancomycin   Assessment: [age] YOF with ESRD and TBI transferred from [facility] to MICU s/p cardiac arrest when her right thigh AV fistula/graft bled out. Patient with MRSA pneumonia receiving vancomycin at [facility] to continue here per pharmacy dosing. Patient also with Cdiff receiving oral vancomycin 500mg  q6h at [facility] now on vancomycin taper.   Per [facility]'s reports, patient was on vancomycin 1g IV three times per week post dialysis. Patient's dialysis schedule is Tuesday, Thursday, Saturday and was scheduled to receive today but missed due to cardiac arrest and transfer. Plan for dialysis tomorrow per nephrology. Currently unsure of antibiotic duration- attempting to contact [facility]- however plan was for 6 weeks of vancomycin. Patient is currently afebrile and WBC is 21.3.   Culture data from [facility]: 06/06/14 Respiratory culture >> MRSA 06/09/14 Blood 2/2 >> MSSA 06/12/14 Blood 1/2 >> MSSA  06/14/14 Blood >> NEG  Goal of Therapy:  Pre-HD level 15-25 mcg/mL; Post-HD 5-15 mcg/mL  Plan:  1. No vancomycin IV today. 2. Will check vancomycin level with AM labs. 3. [facility] antibiotic last doses and [facility].   Link SnufferJessica Ellajane Stong, PharmD, BCPS Clinical Pharmacist [phone] 06/29/2014,12:12 PM

## 2014-06-30 ENCOUNTER — Inpatient Hospital Stay (HOSPITAL_COMMUNITY): Payer: Medicare Other

## 2014-06-30 DIAGNOSIS — I469 Cardiac arrest, cause unspecified: Secondary | ICD-10-CM | POA: Diagnosis not present

## 2014-06-30 DIAGNOSIS — T82598A Other mechanical complication of other cardiac and vascular devices and implants, initial encounter: Secondary | ICD-10-CM | POA: Diagnosis not present

## 2014-06-30 LAB — PREPARE RBC (CROSSMATCH)

## 2014-06-30 LAB — VANCOMYCIN, RANDOM: VANCOMYCIN RM: 10.4 ug/mL

## 2014-06-30 LAB — HEMOGLOBIN AND HEMATOCRIT, BLOOD
HCT: 31.1 % — ABNORMAL LOW (ref 36.0–46.0)
Hemoglobin: 10.4 g/dL — ABNORMAL LOW (ref 12.0–15.0)

## 2014-06-30 LAB — GLUCOSE, CAPILLARY
GLUCOSE-CAPILLARY: 137 mg/dL — AB (ref 70–99)
GLUCOSE-CAPILLARY: 82 mg/dL (ref 70–99)
GLUCOSE-CAPILLARY: 88 mg/dL (ref 70–99)
Glucose-Capillary: 80 mg/dL (ref 70–99)
Glucose-Capillary: 120 mg/dL — ABNORMAL HIGH (ref 70–99)
Glucose-Capillary: 126 mg/dL — ABNORMAL HIGH (ref 70–99)

## 2014-06-30 LAB — PHOSPHORUS: Phosphorus: 3.9 mg/dL (ref 2.3–4.6)

## 2014-06-30 LAB — BASIC METABOLIC PANEL
ANION GAP: 19 — AB (ref 5–15)
BUN: 99 mg/dL — ABNORMAL HIGH (ref 6–23)
CHLORIDE: 92 meq/L — AB (ref 96–112)
CO2: 23 mEq/L (ref 19–32)
Calcium: 9 mg/dL (ref 8.4–10.5)
Creatinine, Ser: 6.24 mg/dL — ABNORMAL HIGH (ref 0.50–1.10)
GFR calc Af Amer: 7 mL/min — ABNORMAL LOW (ref >90)
GFR calc non Af Amer: 6 mL/min — ABNORMAL LOW (ref >90)
Glucose, Bld: 78 mg/dL (ref 70–99)
Potassium: 5.3 mEq/L (ref 3.7–5.3)
SODIUM: 134 meq/L — AB (ref 137–147)

## 2014-06-30 LAB — MAGNESIUM: MAGNESIUM: 2 mg/dL (ref 1.5–2.5)

## 2014-06-30 LAB — CBC
HCT: 16.4 % — ABNORMAL LOW (ref 36.0–46.0)
Hemoglobin: 5.4 g/dL — CL (ref 12.0–15.0)
MCH: 27.4 pg (ref 26.0–34.0)
MCHC: 32.9 g/dL (ref 30.0–36.0)
MCV: 83.2 fL (ref 78.0–100.0)
PLATELETS: 183 10*3/uL (ref 150–400)
RBC: 1.97 MIL/uL — AB (ref 3.87–5.11)
RDW: 19.4 % — ABNORMAL HIGH (ref 11.5–15.5)
WBC: 8.2 10*3/uL (ref 4.0–10.5)

## 2014-06-30 LAB — CORTISOL: Cortisol, Plasma: 19.4 ug/dL

## 2014-06-30 LAB — ABO/RH: ABO/RH(D): A POS

## 2014-06-30 LAB — HEPATITIS B SURFACE ANTIBODY,QUALITATIVE: HEP B S AB: POSITIVE — AB

## 2014-06-30 MED ORDER — SODIUM CHLORIDE 0.9 % IV SOLN: Freq: Once | INTRAVENOUS | Status: AC

## 2014-06-30 MED ORDER — VANCOMYCIN HCL IN DEXTROSE 1-5 GM/200ML-% IV SOLN
1000.0000 mg | INTRAVENOUS | Status: DC | PRN
  Filled 2014-06-30: qty 200

## 2014-06-30 MED ORDER — ONDANSETRON HCL 4 MG/5ML PO SOLN
4.0000 mg | Freq: Three times a day (TID) | ORAL | Status: DC | PRN
  Filled 2014-06-30: qty 5

## 2014-06-30 MED ORDER — CETYLPYRIDINIUM CHLORIDE 0.05 % MT LIQD
7.0000 mL | Freq: Four times a day (QID) | OROMUCOSAL | Status: DC
  Administered 2014-06-30 (×5): 7 mL via OROMUCOSAL

## 2014-06-30 MED ORDER — VITAL HIGH PROTEIN PO LIQD
1000.0000 mL | ORAL | Status: DC
  Administered 2014-06-30 – 2014-07-03 (×4): 1000 mL
  Filled 2014-06-30 (×6): qty 1000

## 2014-06-30 MED ORDER — FAMOTIDINE 40 MG/5ML PO SUSR
20.0000 mg | Freq: Every day | ORAL | Status: DC
  Administered 2014-06-30 – 2014-07-04 (×5): 20 mg
  Filled 2014-06-30 (×5): qty 2.5

## 2014-06-30 MED ORDER — CHLORHEXIDINE GLUCONATE 0.12 % MT SOLN
15.0000 mL | Freq: Two times a day (BID) | OROMUCOSAL | Status: DC
  Administered 2014-06-30 – 2014-07-04 (×8): 15 mL via OROMUCOSAL
  Filled 2014-06-30 (×6): qty 15

## 2014-06-30 MED ORDER — ONDANSETRON HCL 4 MG PO TABS
4.0000 mg | ORAL_TABLET | Freq: Three times a day (TID) | ORAL | Status: DC | PRN
  Administered 2014-06-30: 4 mg
  Filled 2014-06-30: qty 1

## 2014-06-30 MED ORDER — PRO-STAT SUGAR FREE PO LIQD
30.0000 mL | Freq: Three times a day (TID) | ORAL | Status: DC
  Administered 2014-06-30: 14:00:00
  Administered 2014-06-30 – 2014-07-02 (×5): 30 mL
  Administered 2014-07-02: 18:00:00
  Administered 2014-07-03 – 2014-07-04 (×4): 30 mL
  Filled 2014-06-30 (×15): qty 30

## 2014-06-30 MED ORDER — VANCOMYCIN HCL IN DEXTROSE 1-5 GM/200ML-% IV SOLN
1000.0000 mg | INTRAVENOUS | Status: DC | PRN
  Administered 2014-06-30: 1000 mg via INTRAVENOUS
  Filled 2014-06-30: qty 200

## 2014-06-30 NOTE — Progress Notes (Signed)
S:trached O:BP 80/58  Pulse 100  Temp(Src) 98.2 F (36.8 C) (Axillary)  Resp 18  Ht 5\' 5"  (1.651 m)  Wt 85 kg (187 lb 6.3 oz)  BMI 31.18 kg/m2  SpO2 100%  Intake/Output Summary (Last 24 hours) at 06/30/14 1610 Last data filed at 06/30/14 0600  Gross per 24 hour  Intake 370.42 ml  Output    100 ml  Net 270.42 ml   Weight change:  RUE:[id number] CVS:RRR Resp:clear Abd:+ BS NTND Ext:No edema NEURO:somnolent.  Less responsive than yest Lt fem HD cath  . sodium chloride   Intravenous Once  . antiseptic oral rinse  7 mL Mouth Rinse QID  . chlorhexidine  15 mL Mouth Rinse BID  . famotidine  20 mg Per Tube BID  . heparin  5,000 Units Subcutaneous 3 times per day  . hydrocortisone sod succinate (SOLU-CORTEF) inj  50 mg Intravenous Q6H  . levETIRAcetam  250 mg Per Tube BID  . vancomycin  125 mg Oral QID   Followed by  . [START ON 07/06/2014] vancomycin  125 mg Oral BID   Followed by  . [START ON 07/14/2014] vancomycin  125 mg Oral Daily   Followed by  . [START ON 07/21/2014] vancomycin  125 mg Oral QODAY   Followed by  . [START ON 07/29/2014] vancomycin  125 mg Oral Q3 days   Dg Chest Port 1 View  06/30/2014   CLINICAL DATA:  Assess airspace disease  EXAM: PORTABLE CHEST - 1 VIEW  COMPARISON:  06/29/2014  FINDINGS: Tracheostomy tube remains.  No cardiomegaly or change in upper mediastinal contours.  Improving left basilar aeration. There is left basilar atelectasis and pleural effusion based on preceding abdominal CT.  Marked osteopenia.  IMPRESSION: Partial clearance of left basilar atelectasis and small effusion.   Electronically Signed   By: Tiburcio Pea M.D.   On: 06/30/2014 05:12   Dg Chest Port 1 View  06/29/2014   CLINICAL DATA:  Airspace disease.  EXAM: PORTABLE CHEST - 1 VIEW  COMPARISON:  None.  FINDINGS: Tracheostomy present with the tip in the trachea. Lungs show evidence of probable underlying COPD/ chronic bronchitis. There is left lower lobe atelectasis/consolidation  and potentially a small left pleural effusion. No overt edema is identified. No evidence of pneumothorax. The heart size is at the upper limits of normal. Multiple old bilateral rib fractures identified.  IMPRESSION: Probable chronic lung disease. Left lower lobe atelectasis/consolidation with potential small left pleural effusion.   Electronically Signed   By: Irish Lack M.D.   On: 06/29/2014 12:30   BMET    Component Value Date/Time   NA 134* 06/30/2014 0500   K 5.3 06/30/2014 0500   CL 92* 06/30/2014 0500   CO2 23 06/30/2014 0500   GLUCOSE 78 06/30/2014 0500   BUN 99* 06/30/2014 0500   CREATININE 6.24* 06/30/2014 0500   CALCIUM 9.0 06/30/2014 0500   GFRNONAA 6* 06/30/2014 0500   GFRAA 7* 06/30/2014 0500   CBC    Component Value Date/Time   WBC 8.2 06/30/2014 0500   RBC 1.97* 06/30/2014 0500   HGB 5.4* 06/30/2014 0500   HCT 16.4* 06/30/2014 0500   PLT 183 06/30/2014 0500   MCV 83.2 06/30/2014 0500   MCH 27.4 06/30/2014 0500   MCHC 32.9 06/30/2014 0500   RDW 19.4* 06/30/2014 0500   LYMPHSABS 0.9 06/26/2014 2300   MONOABS 0.2 06/26/2014 2300   EOSABS 0.1 06/26/2014 2300   BASOSABS 0.0 06/26/2014 2300  Assessment: 1. Acute blood loss anemia from bleeding AVG 2. VDRF 3. ESRD 4. Hypotension, on levophed Plan: 1.HD today.  Will keep even due to hypotension 2. Transfuse.  Hopefully with higher Hg the pressors could be weaned  [name] T

## 2014-06-30 NOTE — Progress Notes (Signed)
Spoke with Daughter, [name], updated her on current treatment and management plan, patient will have to be more stable before any other surgeries are planned; [name] is in agreement with this.   Stephanie AcreVishal Miu Chiong, MD [facility] Pager [phone] On Call Pager [phone]

## 2014-06-30 NOTE — Consult Note (Signed)
Pt still on Levophed Hemoglobin less than 6 this morning No further bleeding from thigh graft  Exam:  Filed Vitals:   06/30/14 1045 06/30/14 1100 06/30/14 1115 06/30/14 1130  BP: 113/63 107/81 116/79 102/71  Pulse: 93 79 88 94  Temp:  98.5 F (36.9 C)    TempSrc:  Oral    Resp: 17 20 17 18   Height:      Weight:      SpO2:  98%     Right thigh graft no erythema no bruit no bleeding  Will decide thrombectomy/revision of thigh graft vs diatek catheter when more stable.  Fabienne Brunsharles Court Gracia, MD [facility] Office: [phone] Pager: [phone]

## 2014-06-30 NOTE — Progress Notes (Addendum)
INITIAL NUTRITION ASSESSMENT  DOCUMENTATION CODES Per approved criteria  -Obesity Unspecified   INTERVENTION:  Utilize 24M PEPuP Protocol: initiate TF via PEG with Vital High Protein at 25 ml/h and Prostat 30 ml TID on day 1; on day 2, increase to goal rate of 35 ml/h (840 ml per day) to provide 1140 kcals (70% of estimated needs), 119 gm protein, 702 ml free water, 1344 mg potassium, and 672 mg phosphorus daily.  Potassium and phosphorus provision appropriate for ESRD.  NUTRITION DIAGNOSIS: Inadequate oral intake related to inability to eat as evidenced by NPO status.   Goal: Enteral nutrition to provide 60-70% of estimated calorie needs (22-25 kcals/kg ideal body weight) and 100% of estimated protein needs, based on ASPEN guidelines for permissive underfeeding in critically ill obese individuals  Monitor:  TF tolerance/adequacy, weight trend, labs, vent status.  Reason for Assessment: MD Consult for TF initiation and management.  68 y.o. female  Admitting Dx: Cardiac arrest s/p right groin AV fistula/graft exanguination  ASSESSMENT: 68 [facility]. F [facility] after she suffered a cardiac arrest when her right thigh AV fistula/graft bled out.  Currently receiving hemodialysis in her room. Chronic trach/vent and PEG in place. Unsure of usual TF regimen. History of C. diff.   Nutrition Focused Physical Exam:  Subcutaneous Fat:  Orbital Region: WNL Upper Arm Region: WNL Thoracic and Lumbar Region: NA  Muscle:  Temple Region: WNL Clavicle Bone Region: WNL Clavicle and Acromion Bone Region: WNL Scapular Bone Region: NA Dorsal Hand: WNL Patellar Region: mild depletion Anterior Thigh Region: WNL Posterior Calf Region: severe depletion  Edema: none   Patient is currently intubated on ventilator support MV: 9.2 L/min Temp (24hrs), Avg:98.1 F (36.7 C), Min:97.8 F (36.6 C), Max:98.5 F (36.9 C)  Propofol: none  Height: Ht Readings from Last 1  Encounters:  06/29/14 5\' 5"  (1.651 m)    Weight: Wt Readings from Last 1 Encounters:  06/30/14 186 lb 4.6 oz (84.5 kg)    Ideal Body Weight: 56.8 kg  % Ideal Body Weight: 149%  Wt Readings from Last 10 Encounters:  06/30/14 186 lb 4.6 oz (84.5 kg)    Usual Body Weight: unknown  % Usual Body Weight: N/A  BMI:  Body mass index is 31 kg/(m^2).  Estimated Nutritional Needs: Kcal: 1620 Protein: 114 gm Fluid: 1.8 L  Skin: WDL  Diet Order: NPO  EDUCATION NEEDS: -Education not appropriate at this time   Intake/Output Summary (Last 24 hours) at 06/30/14 1025 Last data filed at 06/30/14 0900  Gross per 24 hour  Intake  604.8 ml  Output    100 ml  Net  504.8 ml    Last BM: 8/5   Labs:   Recent Labs Lab 06/26/14 2300 06/29/14 1114 06/30/14 0500  NA 138 133* 134*  K 4.9 4.7 5.3  CL 90* 89* 92*  CO2 32 22 23  BUN 48* 93* 99*  CREATININE 2.82* 5.50* 6.24*  CALCIUM 10.6* 8.9 9.0  MG  --  2.1 2.0  PHOS  --  4.3 3.9  GLUCOSE 63* 219* 78    CBG (last 3)   Recent Labs  06/29/14 2339 06/30/14 0423 06/30/14 0819  GLUCAP 88 80 82    Scheduled Meds: . sodium chloride   Intravenous Once  . antiseptic oral rinse  7 mL Mouth Rinse QID  . chlorhexidine  15 mL Mouth Rinse BID  . famotidine  20 mg Per Tube Daily  . heparin  5,000  Units Subcutaneous 3 times per day  . hydrocortisone sod succinate (SOLU-CORTEF) inj  50 mg Intravenous Q6H  . levETIRAcetam  250 mg Per Tube BID  . vancomycin  125 mg Oral QID   Followed by  . [START ON 07/06/2014] vancomycin  125 mg Oral BID   Followed by  . [START ON 07/14/2014] vancomycin  125 mg Oral Daily   Followed by  . [START ON 07/21/2014] vancomycin  125 mg Oral QODAY   Followed by  . [START ON 07/29/2014] vancomycin  125 mg Oral Q3 days    Continuous Infusions: . sodium chloride 50 mL/hr at 06/30/14 0301  . norepinephrine (LEVOPHED) Adult infusion 12 mcg/min (06/30/14 1011)    Past Medical History  Diagnosis Date   . Hypertension   . ESRD (end stage renal disease)   . Stroke   . Diabetes mellitus   . Subdural hematoma   . Seizure disorder   . Anemia   . Secondary hyperparathyroidism   . Cataract   . MRSA (methicillin resistant Staphylococcus aureus)   . C. difficile colitis   . MRSA bacteremia   . Central sleep apnea   . Ventricular tachycardia     Past Surgical History  Procedure Laterality Date  . Cholecystectomy    . Vascular surgery    . Thyroidectomy    . Eye surgery    . Craniotomy      Joaquin Courts, RD, LDN, CNSC Pager [phone] After Hours Pager [phone]

## 2014-06-30 NOTE — Progress Notes (Signed)
     HD cath was exchanged for TLC so that pt could undergo HD treatment today. Still on vasopressors. Once off pressors will reassess for possible revision of right thigh graft.   Lianne CureMaureen Collins, [facility]

## 2014-06-30 NOTE — Progress Notes (Signed)
PULMONARY / CRITICAL CARE MEDICINE   Name: [name] MRN: [id number] DOB: Feb 19, 1946    ADMISSION DATE:  06/29/2014 CONSULTATION DATE:  06/29/2014  REFERRING MD :  [facility]  CHIEF COMPLAINT:  Cardiac arrest s/p right groin AV fistula/graft exanguination  INITIAL PRESENTATION: 68 [facility].o. F from [facility] after she suffered a cardiac arrest when her right thigh AV fistula/graft bled out.  STUDIES:  8/1 CT Abd >>> no acute process, diverticulosis without acute diverticulitis, small b/l pleural effusions, multiple bilateral renal cysts.  SIGNIFICANT EVENTS: [facility] for abd pain 8/4 right groin AV fistula/graft bled out which led to cardiac arrest, [facility].   HISTORY OF PRESENT ILLNESS:  [name] is a 68 y.o. 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  On 8/4, her right thigh AV fistula/graft began bleeding profusely.  This unfortunately led to a cardiac arrest.  Pt was successfully resuscitated and was [facility]/workup.  Of note, pt's daughter states that right thigh has bled several times over the past few months and in fact, [facility] who dilated the graft recently.   PAST MEDICAL HISTORY :  Past Medical History  Diagnosis Date  . Hypertension   . ESRD (end stage renal disease)   . Stroke   . Diabetes mellitus   . Subdural hematoma   . Seizure disorder   . Anemia   . Secondary hyperparathyroidism   . Cataract   . MRSA (methicillin resistant Staphylococcus aureus)   . C. difficile colitis   . MRSA bacteremia   . Central sleep apnea   . Ventricular tachycardia    Past Surgical History  Procedure Laterality Date  . Cholecystectomy    . Vascular surgery    . Thyroidectomy    . Eye surgery    . Craniotomy     Prior to Admission medications   Medication Sig Start Date End Date Taking?  Authorizing Provider  acetaminophen (TYLENOL) 160 MG/5ML solution Place 650 mg into feeding tube every 6 (six) hours as needed for mild pain or fever.    Historical Provider, MD  cephALEXin (KEFLEX) 250 MG/5ML suspension Place 10 mLs (500 mg total) into feeding tube 4 (four) times daily. 06/27/14 07/04/14  Dione Boozeavid Glick, MD  chlorhexidine (PERIDEX) 0.12 % solution Use as directed 15 mLs in the mouth or throat every 12 (twelve) hours.    Historical Provider, MD  darbepoetin (ARANESP) 60 MCG/0.3ML SOLN injection Inject 60 mcg into the skin every 7 (seven) days.    Historical Provider, MD  dexamethasone (DECADRON) 4 MG tablet Give 4 mg by tube daily.    Historical Provider, MD  ipratropium-albuterol (DUONEB) 0.5-2.5 (3) MG/3ML SOLN Take 3 mLs by nebulization every 6 (six) hours as needed (for shortness of breath).    Historical Provider, MD  levETIRAcetam (KEPPRA) 100 MG/ML solution Place 250 mg into feeding tube every 12 (twelve) hours.    Historical Provider, MD  midodrine (PROAMATINE) 5 MG tablet Give 10 mg by tube every other day as needed (per [facility]).    Historical Provider, MD  modafinil (PROVIGIL) 200 MG tablet Take 200 mg by mouth daily.    Historical Provider, MD  ondansetron (ZOFRAN) 4 MG/2ML SOLN injection Inject 4 mg into the vein every 6 (six) hours as needed for nausea or vomiting.    Historical  Provider, MD  polyethylene glycol (MIRALAX / GLYCOLAX) packet Take 17 g by mouth daily as needed for mild constipation.    Historical Provider, MD  polyvinyl alcohol (LIQUIFILM TEARS) 1.4 % ophthalmic solution Place 1 drop into both eyes as needed for dry eyes.    Historical Provider, MD  UNABLE TO FIND Give 1 Bottle by tube continuous. Med Name: Novasource Renal- 25ml/hour continuous    Historical Provider, MD  Vancomycin HCl (VANCOCIN HCL PO) Take 125 Tubes by mouth every 6 (six) hours.    Historical Provider, MD  Vitamins A & D (VITAMIN A & D) ointment Apply 1 application topically every 12 (twelve)  hours.    Historical Provider, MD   Allergies  Allergen Reactions  . Penicillins Hives and Rash    FAMILY HISTORY:  Family History  Problem Relation Age of Onset  . Hypertension Mother   . Diabetes Mellitus II Mother   . Stroke Father   . Hypertension Father    SOCIAL HISTORY:  reports that she quit smoking about 15 years ago. Her smoking use included Cigarettes. She has a 30 pack-year smoking history. She does not have any smokeless tobacco history on file. She reports that she does not drink alcohol or use illicit drugs.   SUBJECTIVE:  Awake and alert this morning, still somnolent, but following commands, no complaints of pain.   VITAL SIGNS: Temp:  [97.8 F (36.6 C)-98.5 F (36.9 C)] 97.8 F (36.6 C) (08/05 0849) Pulse Rate:  [25-100] 60 (08/05 0909) Resp:  [0-19] 15 (08/05 0909) BP: (67-140)/(45-83) 74/58 mmHg (08/05 0759) SpO2:  [96 %-100 %] 97 % (08/05 0909) FiO2 (%):  [40 %-80 %] 40 % (08/05 0759) Weight:  [181 lb 3.5 oz (82.2 kg)-187 lb 6.3 oz (85 kg)] 187 lb 6.3 oz (85 kg) (08/05 0302) HEMODYNAMICS:   VENTILATOR SETTINGS: Vent Mode:  [-] PRVC FiO2 (%):  [40 %-80 %] 40 % Set Rate:  [18 bmp-30 bmp] 18 bmp Vt Set:  [500 mL] 500 mL PEEP:  [5 cmH20] 5 cmH20 Plateau Pressure:  [15 cmH20-18 cmH20] 18 cmH20 INTAKE / OUTPUT:  Intake/Output Summary (Last 24 hours) at 06/30/14 0914 Last data filed at 06/30/14 0700  Gross per 24 hour  Intake  444.8 ml  Output    100 ml  Net  344.8 ml    PHYSICAL EXAMINATION: General: Obese female, resting in bed, in NAD. Neuro: Follows some commands, attempts to converse. HEENT: [place]/AT. PERRL, sclerae anicteric.  Trach in place, C/D/I. Cardiovascular: RRR, no M/R/G.  Lungs: Respirations even and unlabored.  CTA bilaterally, No W/R/R. On full vent support. Abdomen: BS x 4, soft, NT/ND.  PEG in place. Musculoskeletal: No gross deformities, no edema.  Skin: Intact, warm, no rashes.   LABS:  CBC  Recent Labs Lab  06/26/14 2300 06/29/14 1114 06/30/14 0500  WBC 6.3 21.3* 8.2  HGB 8.8* 6.9* 5.4*  HCT 28.5* 21.4* 16.4*  PLT 203 257 183   Coag's No results found for this basename: APTT, INR,  in the last 168 hours BMET  Recent Labs Lab 06/26/14 2300 06/29/14 1114 06/30/14 0500  NA 138 133* 134*  K 4.9 4.7 5.3  CL 90* 89* 92*  CO2 32 22 23  BUN 48* 93* 99*  CREATININE 2.82* 5.50* 6.24*  GLUCOSE 63* 219* 78   Electrolytes  Recent Labs Lab 06/26/14 2300 06/29/14 1114 06/30/14 0500  CALCIUM 10.6* 8.9 9.0  MG  --  2.1 2.0  PHOS  --  4.3  3.9   Sepsis Markers  Recent Labs Lab 06/29/14 1300 06/29/14 1713 06/29/14 2300  LATICACIDVEN 7.1* 5.0* 3.5*   ABG No results found for this basename: PHART, PCO2ART, PO2ART,  in the last 168 hours Liver Enzymes  Recent Labs Lab 06/26/14 2300 06/29/14 1114  AST 14 31  ALT 13 27  ALKPHOS 899* 779*  BILITOT 0.4 0.3  ALBUMIN 3.8 2.7*   Cardiac Enzymes  Recent Labs Lab 06/29/14 1113 06/29/14 1713 06/29/14 2300  TROPONINI <0.30 <0.30 <0.30   Glucose  Recent Labs Lab 06/29/14 1100 06/29/14 2339 06/30/14 0423 06/30/14 0819  GLUCAP 237* 88 80 82    Imaging Dg Chest Port 1 View  06/29/2014   CLINICAL DATA:  Airspace disease.  EXAM: PORTABLE CHEST - 1 VIEW  COMPARISON:  None.  FINDINGS: Tracheostomy present with the tip in the trachea. Lungs show evidence of probable underlying COPD/ chronic bronchitis. There is left lower lobe atelectasis/consolidation and potentially a small left pleural effusion. No overt edema is identified. No evidence of pneumothorax. The heart size is at the upper limits of normal. Multiple old bilateral rib fractures identified.  IMPRESSION: Probable chronic lung disease. Left lower lobe atelectasis/consolidation with potential small left pleural effusion.   Electronically Signed   By: Irish Lack M.D.   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  MRSA PNA P:   Full vent support for now. Wean as able. Continue Vancomycin as per ID section. Duonebs. CXR  AM.  CARDIOVASCULAR CVL left femoral 06/29/14 A:  S/p cardiac arrest - presumed in setting right thigh AV graft hemorrhage. ? Hx of V.Tach - latest progress note from [facility] states pt is on Amiodarone and Diltiazem, though, this not seen anywhere else in chart. ? Endocarditis - was being treated for presumed endocarditis at [facility] (last dose of Abx would have been on 8/12) P:  Levophed. Goal MAP > 65. Check cortisol. Stress steroids (on Decadron as outpatient for respiratory failure for the pass 1 month).  Will need prolong steroid wean.  Hold outpatient Midodrine for now. Trend troponin / lactate. Hold off on amiodarone/diltiazem for now. Unsure about history of endocarditis, if she has fever, inc wbc, will then reculture, get ECHO and consider tx based on results.   RENAL A:  [facility] on dialysis T/Th/Sat - very limited access, right thigh AV fistula initially bleeding, now stable.  Bilateral IJ's have had trouble in past, left groin has femoral TLC now converted to HD catheter. P:   Renal following - appreciated input.  Per Vascular - "If the patient becomes stable and off pressors will reassess for revision/thrombectomy of thigh graft. If her course becomes prolonged may be better served with tunneled dialysis catheter in left groin" - appreciate input BMP in AM.  GASTROINTESTINAL A:  GI prophylaxis Nutrition P:   Pepcid. Will restart TF today Nutrition consult.   HEMATOLOGIC A:  VTE prophylaxis Chronic anemia  - low H\H today P:  SCD's. Transfuse 2unit of PRBC during HD Recheck H\H this afternoon  INFECTIOUS A:  Blood Cx 7/18 and 7/21 >>> MRSA  Bacteremia Diarrhea P:   C.diff PCR >>> Abx: Vanc  ENDOCRINE A:  DM ? AI - on decadron as outpatient (for suspected chronic respiratory failure).  P:   CBG's q4hrs. SSI. Stress steroids,start wean tomorrow.   NEUROLOGIC A:  TBI - s/p remote bilateral SDH's  Hx CVA Acute Encephalopathy Seizure disorder P:   Continue outpatient Keppra. Monitor.  TODAY'S SUMMARY: 68 y.o. F transferred from [facility] due to cardiac arrest after her right thigh AV fistula bled out.  Vascular and Renal consulted.  Currently on vent and on Levophed.  She is on vanc for MRSA pneumonia and MSSA bacteremia and hx of C.Diff.  Now improving and alert, getting HD today and 2u PRBC  Stephanie Acre, MD [facility] Pager [phone] On Call Pager [phone]

## 2014-06-30 NOTE — Progress Notes (Signed)
CRITICAL VALUE ALERT  Critical value received:  Hgb 5.4  Date of notification:  06/30/2014  Time of notification:  0620  Critical value read back:Yes.    Nurse who received alert:  Lillia CorporalHancock, Raven Harmes Elizabeth  MD notified (1st page):  Dr. Molli KnockYacoub  Time of first page:  0625  MD notified (2nd page):  Time of second page:  Responding MD:  Dr. Molli KnockYacoub  Time MD responded:  [phone]

## 2014-06-30 NOTE — Progress Notes (Signed)
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 Penicillins Hives and Rash    Patient Measurements: Height:  (85kg) Weight: 184 lb 1.4 oz (83.5 kg) IBW/kg (Calculated) : 57  Vital Signs: Temp: 98.3 F (36.8 C) (08/05 1300) Temp src: Oral (08/05 1300) BP: 106/65 mmHg (08/05 1400) Pulse Rate: 88 (08/05 1300) Intake/Output from previous day: 08/04 0701 - 08/05 0700 In: 444.8 [I.V.:414.8] Out: 100 [Stool:100] Intake/Output from this shift: Total I/O In: 910 [I.V.:240; Blood:670] Out: 1209 [Other:1209]  Labs:  Recent Labs  06/29/14 1114 06/30/14 0500 06/30/14 1030  WBC 21.3* 8.2  --   HGB 6.9* 5.4* 10.4*  PLT 257 183  --   CREATININE 5.50* 6.24*  --    Estimated Creatinine Clearance: 9.2 ml/min (by C-G formula based on Cr of 6.24).  Recent Labs  06/30/14 0500  VANCORANDOM 10.4     Microbiology: Recent Results (from the past 720 hour(s))  URINE CULTURE     Status: None   Collection Time    06/26/14 11:35 PM      Result Value Ref Range Status   Specimen Description URINE, CATHETERIZED   Final   Special Requests CX ADDED AT 0143 ON [id number]   Final   Culture  Setup Time     Final   Value: 06/27/2014 02:41     Performed at [facility]   Colony Count     Final   Value: NO GROWTH     Performed at [facility]   Culture     Final   Value: NO GROWTH     Performed at [facility]   Report Status 06/28/2014 FINAL   Final  MRSA PCR SCREENING     Status: None   Collection Time    06/29/14 11:05 AM      Result Value Ref Range Status   MRSA by PCR NEGATIVE  NEGATIVE Final   Comment:            The GeneXpert MRSA Assay (FDA     approved for NASAL specimens     only), is one component of a     comprehensive MRSA colonization     surveillance program. It is not     intended to diagnose MRSA     infection nor to guide or   monitor treatment for     MRSA infections.  CLOSTRIDIUM DIFFICILE BY PCR     Status: None   Collection Time    06/29/14  1:19 PM      Result Value Ref Range Status   C difficile by pcr NEGATIVE  NEGATIVE Final    Anti-infectives   Start     Dose/Rate Route Frequency Ordered Stop   07/29/14 1000  vancomycin (VANCOCIN) 50 mg/mL oral solution 125 mg     125 mg Oral Every 3 DAYS 06/29/14 1200 08/13/14 0959   07/21/14 1000  vancomycin (VANCOCIN) 50 mg/mL oral solution 125 mg     125 mg Oral Every other day 06/29/14 1200 07/29/14 0959   07/14/14 1000  vancomycin (VANCOCIN) 50 mg/mL oral solution 125 mg     125 mg Oral Daily 06/29/14 1200 07/21/14 0959   07/06/14 2200  vancomycin (VANCOCIN) 50 mg/mL oral solution 125 mg     125 mg Oral 2 times daily 06/29/14 1200 07/13/14 2159   06/30/14 1500  vancomycin (VANCOCIN) IVPB 1000 mg/200 mL  premix  Status:  Discontinued     1,000 mg 200 mL/hr over 60 Minutes Intravenous Every Dialysis 06/30/14 0836 06/30/14 0841   06/30/14 0840  vancomycin (VANCOCIN) IVPB 1000 mg/200 mL premix     1,000 mg 200 mL/hr over 60 Minutes Intravenous Every Dialysis 06/30/14 0841     06/29/14 1400  vancomycin (VANCOCIN) 50 mg/mL oral solution 125 mg     125 mg Oral 4 times daily 06/29/14 1200 07/06/14 1459      Assessment: 68 YOF on day #14 of IV vancomycin (total antibiotic day #22)  for MRSA pneumonia, MSSA bacteremia, and 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 Plan per ID was for 4 weeks of vancomycin with planned stop date of 07/07/14.   WBC is trending down. Patient is currently afebrile.   Pre-HD vancomycin level this AM was low at 10.4. Patient receiving HD today and tolerating.   Goal of Therapy:  Pre HD vancomycin level 15-25  Plan:  1. Vancomycin 1g IV post dialysis today.  2. Follow-up HD schedule per nephrology for further dosing.  3. Monitor clinical status and further culture results.   Link SnufferJessica Brogan Martis,  PharmD, BCPS Clinical Pharmacist [phone] 06/30/2014,3:08 PM

## 2014-07-01 ENCOUNTER — Encounter (HOSPITAL_COMMUNITY): Payer: Self-pay

## 2014-07-01 ENCOUNTER — Inpatient Hospital Stay (HOSPITAL_COMMUNITY): Payer: Medicare Other

## 2014-07-01 DIAGNOSIS — T82598A Other mechanical complication of other cardiac and vascular devices and implants, initial encounter: Secondary | ICD-10-CM | POA: Diagnosis not present

## 2014-07-01 DIAGNOSIS — I469 Cardiac arrest, cause unspecified: Secondary | ICD-10-CM | POA: Diagnosis not present

## 2014-07-01 LAB — GLUCOSE, CAPILLARY
GLUCOSE-CAPILLARY: 97 mg/dL (ref 70–99)
GLUCOSE-CAPILLARY: 67 mg/dL — AB (ref 70–99)
Glucose-Capillary: 80 mg/dL (ref 70–99)
Glucose-Capillary: 90 mg/dL (ref 70–99)
Glucose-Capillary: 91 mg/dL (ref 70–99)
Glucose-Capillary: 95 mg/dL (ref 70–99)
Glucose-Capillary: 96 mg/dL (ref 70–99)

## 2014-07-01 LAB — PREPARE RBC (CROSSMATCH)

## 2014-07-01 LAB — CBC WITH DIFFERENTIAL/PLATELET
Basophils Absolute: 0 10*3/uL (ref 0.0–0.1)
Basophils Relative: 0 % (ref 0–1)
EOS ABS: 0 10*3/uL (ref 0.0–0.7)
Eosinophils Relative: 0 % (ref 0–5)
HCT: 20 % — ABNORMAL LOW (ref 36.0–46.0)
Hemoglobin: 6.7 g/dL — CL (ref 12.0–15.0)
Lymphocytes Relative: 11 % — ABNORMAL LOW (ref 12–46)
Lymphs Abs: 0.5 10*3/uL — ABNORMAL LOW (ref 0.7–4.0)
MCH: 28.8 pg (ref 26.0–34.0)
MCHC: 33.5 g/dL (ref 30.0–36.0)
MCV: 85.8 fL (ref 78.0–100.0)
MONO ABS: 0.3 10*3/uL (ref 0.1–1.0)
Monocytes Relative: 6 % (ref 3–12)
Neutro Abs: 3.6 10*3/uL (ref 1.7–7.7)
Neutrophils Relative %: 83 % — ABNORMAL HIGH (ref 43–77)
Platelets: 110 10*3/uL — ABNORMAL LOW (ref 150–400)
RBC: 2.33 MIL/uL — ABNORMAL LOW (ref 3.87–5.11)
RDW: 17.4 % — ABNORMAL HIGH (ref 11.5–15.5)
WBC: 4.4 10*3/uL (ref 4.0–10.5)

## 2014-07-01 LAB — RENAL FUNCTION PANEL
Albumin: 2.4 g/dL — ABNORMAL LOW (ref 3.5–5.2)
Anion gap: 15 (ref 5–15)
BUN: 51 mg/dL — ABNORMAL HIGH (ref 6–23)
CALCIUM: 8.4 mg/dL (ref 8.4–10.5)
CO2: 24 mEq/L (ref 19–32)
Chloride: 97 mEq/L (ref 96–112)
Creatinine, Ser: 3.57 mg/dL — ABNORMAL HIGH (ref 0.50–1.10)
GFR calc Af Amer: 14 mL/min — ABNORMAL LOW (ref >90)
GFR, EST NON AFRICAN AMERICAN: 12 mL/min — AB (ref >90)
Glucose, Bld: 86 mg/dL (ref 70–99)
Phosphorus: 3.4 mg/dL (ref 2.3–4.6)
Potassium: 3.7 mEq/L (ref 3.7–5.3)
SODIUM: 136 meq/L — AB (ref 137–147)

## 2014-07-01 LAB — DIC (DISSEMINATED INTRAVASCULAR COAGULATION) PANEL
D-Dimer, Quant: 2.67 ug/mL-FEU — ABNORMAL HIGH (ref 0.00–0.48)
FIBRINOGEN: 274 mg/dL (ref 204–475)
INR: 1.18 (ref 0.00–1.49)
PLATELETS: 102 10*3/uL — AB (ref 150–400)
SMEAR REVIEW: NONE SEEN
aPTT: 38 seconds — ABNORMAL HIGH (ref 24–37)

## 2014-07-01 LAB — HEMOGLOBIN AND HEMATOCRIT, BLOOD
HCT: 27.7 % — ABNORMAL LOW (ref 36.0–46.0)
Hemoglobin: 9.4 g/dL — ABNORMAL LOW (ref 12.0–15.0)

## 2014-07-01 LAB — DIC (DISSEMINATED INTRAVASCULAR COAGULATION)PANEL: Prothrombin Time: 15 seconds (ref 11.6–15.2)

## 2014-07-01 LAB — VANCOMYCIN, RANDOM: VANCOMYCIN RM: 23.8 ug/mL

## 2014-07-01 MED ORDER — ETOMIDATE 2 MG/ML IV SOLN
0.3000 mg/kg | Freq: Once | INTRAVENOUS | Status: AC
  Administered 2014-07-01: 20 mg via INTRAVENOUS

## 2014-07-01 MED ORDER — CETYLPYRIDINIUM CHLORIDE 0.05 % MT LIQD
7.0000 mL | Freq: Four times a day (QID) | OROMUCOSAL | Status: DC
Start: 2014-07-01 — End: 2014-07-04
  Administered 2014-07-01 – 2014-07-04 (×13): 7 mL via OROMUCOSAL

## 2014-07-01 MED ORDER — HYDROCORTISONE NA SUCCINATE PF 100 MG IJ SOLR: 25.0000 mg | Freq: Two times a day (BID) | INTRAMUSCULAR | Status: DC

## 2014-07-01 MED ORDER — SODIUM CHLORIDE 0.9 % IV SOLN: Freq: Once | INTRAVENOUS | Status: AC

## 2014-07-01 MED ORDER — FENTANYL CITRATE 0.05 MG/ML IJ SOLN
100.0000 ug | Freq: Once | INTRAMUSCULAR | Status: AC
  Administered 2014-07-01: 100 ug via INTRAVENOUS

## 2014-07-01 MED ORDER — MIDAZOLAM HCL 10 MG/2ML IJ SOLN: 2.0000 mg | Freq: Once | INTRAMUSCULAR | Status: DC

## 2014-07-01 MED ORDER — VANCOMYCIN HCL 500 MG IV SOLR
500.0000 mg | INTRAVENOUS | Status: DC | PRN
  Filled 2014-07-01: qty 500

## 2014-07-01 MED ORDER — HYDROCORTISONE NA SUCCINATE PF 100 MG IJ SOLR
50.0000 mg | Freq: Three times a day (TID) | INTRAMUSCULAR | Status: DC
  Administered 2014-07-01 – 2014-07-04 (×7): 50 mg via INTRAVENOUS
  Filled 2014-07-01 (×11): qty 1

## 2014-07-01 MED ORDER — SODIUM CHLORIDE 0.9 % IV SOLN
500.0000 mg | INTRAVENOUS | Status: AC | PRN
  Administered 2014-07-01: 500 mg via INTRAVENOUS
  Filled 2014-07-01: qty 500

## 2014-07-01 MED ORDER — HYDROCORTISONE NA SUCCINATE PF 100 MG IJ SOLR: 25.0000 mg | Freq: Every day | INTRAMUSCULAR | Status: DC

## 2014-07-01 MED ORDER — SODIUM CHLORIDE 0.9 % IV SOLN
Freq: Once | INTRAVENOUS | Status: AC
  Administered 2014-07-01: 10 mL/h via INTRAVENOUS

## 2014-07-01 MED ORDER — HYDROCORTISONE NA SUCCINATE PF 100 MG IJ SOLR: 50.0000 mg | Freq: Two times a day (BID) | INTRAMUSCULAR | Status: DC

## 2014-07-01 MED ORDER — FENTANYL CITRATE 0.05 MG/ML IJ SOLN
INTRAMUSCULAR | Status: AC
  Filled 2014-07-01: qty 2

## 2014-07-01 MED ORDER — ETOMIDATE 2 MG/ML IV SOLN
INTRAVENOUS | Status: AC
  Filled 2014-07-01: qty 10

## 2014-07-01 MED ORDER — MIDAZOLAM HCL 2 MG/2ML IJ SOLN
INTRAMUSCULAR | Status: AC
  Administered 2014-07-01: 2 mg
  Filled 2014-07-01: qty 2

## 2014-07-01 NOTE — Progress Notes (Signed)
Patient placed on 40% ATC per Dr. Vassie LollAlva.  Sats currently 100%.  Will monitor patient throughout the night.  RT will continue to monitor.

## 2014-07-01 NOTE — Progress Notes (Signed)
eLink Physician-Brief Progress Note Patient Name: Michelene HeadySandra A Maglione DOB: 1946-10-14 MRN: [id number]  Date of Service  07/01/2014   HPI/Events of Note   Hgb back down again this morning Suspect RP bleed  eICU Interventions  Transfuse 1 U PRBC CT ab/pelvis pending   Intervention Category Major Interventions: Hemorrhage - evaluation and management  MCQUAID, DOUGLAS 07/01/2014, 6:01 AM

## 2014-07-01 NOTE — Progress Notes (Signed)
Patient still suffering from leak, even after changing of trach.  MD made aware and stated would change trach to XLT tomorrow morning.  RT will continue to monitor.

## 2014-07-01 NOTE — Progress Notes (Signed)
Transfused 2 units of PRBC...no signs/symptoms of transfusion reaction observed.Marland Kitchen..Marland Kitchen

## 2014-07-01 NOTE — Progress Notes (Signed)
Dr. Molli KnockYacoub was notified of pt's low exp volumes, tachypnea and loud audible air leak from new trach.  Dr.  Vassie LollAlva will be over to assess patient.

## 2014-07-01 NOTE — Procedures (Signed)
   Tracheostomy tube change: Verbal timeout was performed prior to the procedure. The old  # 5 portex cuffed trach was carefully removed. the tracheostomy site was very tight to the current trach and  appeared: well granulated.  A new # 4 Cuffed trach was  placed w/ minimal resistance after we were not able to pass 6 cuffless. The  tracheostomy stoma and secured with velcro trach ties. The tracheostomy was patent, good color change observed via EZ-CAP, and the patient was easily able to voice with finger occlusion and tolerated the procedure well with no immediate complications.   Anders SimmondsPete Tayva Easterday ACNP-BC [facility] Pulmonary/Critical Care Pager # [phone] OR # [phone] if no answer

## 2014-07-01 NOTE — Progress Notes (Signed)
Called For leak around trach site. Apparently she had a size 5 cuff tube that was changed today to size for cough, since a 6 cuff was was unable to be passed. She was pulling good tidal volumes (about 800 cc)on pressure support 5/5, and did not appear to be in any distress. The vent alarm was due to low exhaled tidal volume, perhaps due to air leaking from around the tracheostomy site. Prior to her bleeding episode, she was tolerating capping of the tracheostomy As such, we went there and placed her on trach collar which he seems to tolerate pretty well at this time. Continue to monitor in the ICU. Plan was discussed with the daughter at the bedside  Oretha MilchALVA,[place] Nie V. MD

## 2014-07-01 NOTE — Progress Notes (Signed)
S:trached O:BP 100/65  Pulse 73  Temp(Src) 98.7 F (37.1 C) (Oral)  Resp 20  Ht 5\' 5"  (1.651 m)  Wt 83.7 kg (184 lb 8.4 oz)  BMI 30.71 kg/m2  SpO2 100%  Intake/Output Summary (Last 24 hours) at 07/01/14 0639 Last data filed at 07/01/14 0400  Gross per 24 hour  Intake 3018.56 ml  Output   1309 ml  Net 1709.56 ml   Weight change: 2.3 kg (5 lb 1.1 oz) Gen: awake and alert CVS:RRR Resp:clear  Abd:+ BS NTND Ext:No edema NEURO: follows commands.  Mouths words Lt fem HD cath  . sodium chloride   Intravenous Once  . sodium chloride   Intravenous Once  . antiseptic oral rinse  7 mL Mouth Rinse QID  . chlorhexidine  15 mL Mouth Rinse BID  . famotidine  20 mg Per Tube Daily  . feeding supplement (PRO-STAT SUGAR FREE 64)  30 mL Per Tube TID  . feeding supplement (VITAL HIGH PROTEIN)  1,000 mL Per Tube Q24H  . heparin  5,000 Units Subcutaneous 3 times per day  . hydrocortisone sod succinate (SOLU-CORTEF) inj  50 mg Intravenous Q6H  . levETIRAcetam  250 mg Per Tube BID  . vancomycin  125 mg Oral QID   Followed by  . [START ON 07/06/2014] vancomycin  125 mg Oral BID   Followed by  . [START ON 07/14/2014] vancomycin  125 mg Oral Daily   Followed by  . [START ON 07/21/2014] vancomycin  125 mg Oral QODAY   Followed by  . [START ON 07/29/2014] vancomycin  125 mg Oral Q3 days   Dg Chest Port 1 View  06/30/2014   CLINICAL DATA:  Assess airspace disease  EXAM: PORTABLE CHEST - 1 VIEW  COMPARISON:  06/29/2014  FINDINGS: Tracheostomy tube remains.  No cardiomegaly or change in upper mediastinal contours.  Improving left basilar aeration. There is left basilar atelectasis and pleural effusion based on preceding abdominal CT.  Marked osteopenia.  IMPRESSION: Partial clearance of left basilar atelectasis and small effusion.   Electronically Signed   By: Tiburcio Pea M.D.   On: 06/30/2014 05:12   Dg Chest Port 1 View  06/29/2014   CLINICAL DATA:  Airspace disease.  EXAM: PORTABLE CHEST - 1 VIEW   COMPARISON:  None.  FINDINGS: Tracheostomy present with the tip in the trachea. Lungs show evidence of probable underlying COPD/ chronic bronchitis. There is left lower lobe atelectasis/consolidation and potentially a small left pleural effusion. No overt edema is identified. No evidence of pneumothorax. The heart size is at the upper limits of normal. Multiple old bilateral rib fractures identified.  IMPRESSION: Probable chronic lung disease. Left lower lobe atelectasis/consolidation with potential small left pleural effusion.   Electronically Signed   By: Irish Lack M.D.   On: 06/29/2014 12:30   BMET    Component Value Date/Time   NA 136* 07/01/2014 0443   K 3.7 07/01/2014 0443   CL 97 07/01/2014 0443   CO2 24 07/01/2014 0443   GLUCOSE 86 07/01/2014 0443   BUN 51* 07/01/2014 0443   CREATININE 3.57* 07/01/2014 0443   CALCIUM 8.4 07/01/2014 0443   GFRNONAA 12* 07/01/2014 0443   GFRAA 14* 07/01/2014 0443   CBC    Component Value Date/Time   WBC 4.4 07/01/2014 0443   RBC 2.33* 07/01/2014 0443   HGB 6.7* 07/01/2014 0443   HCT 20.0* 07/01/2014 0443   PLT PENDING 07/01/2014 0443   MCV 85.8 07/01/2014 0443   MCH  28.8 07/01/2014 0443   MCHC 33.5 07/01/2014 0443   RDW 17.4* 07/01/2014 0443   LYMPHSABS PENDING 07/01/2014 0443   MONOABS PENDING 07/01/2014 0443   EOSABS PENDING 07/01/2014 0443   BASOSABS PENDING 07/01/2014 0443     Assessment: 1. Acute blood loss anemia from bleeding AVG.  I don't think the Hg of 10.4 yest was a true value 2. VDRF 3. ESRD 4. Hypotension, improved, off levophed Plan: 1.plan HD today to get back on TTS schedule and give 2 units as I anticipate her to go for declot in AM 2. DC IV fluids  Hillel Card T

## 2014-07-01 NOTE — Progress Notes (Signed)
CRITICAL VALUE ALERT  Critical value received:  Hgb 6.7  Date of notification:  07/01/2014   Time of notification:  0549  Critical value read back:Yes.    Nurse who received alert:  Crist FatJoy Leather Estis RN  MD notified (1st page):  Dr. Kendrick FriesMcQuaid  Time of first page:  0600  MD notified (2nd page):  Time of second page:  Responding MD:  mcquaid  Time MD responded:  0600

## 2014-07-01 NOTE — Progress Notes (Addendum)
PULMONARY / CRITICAL CARE MEDICINE   Name: [name] MRN: [id number] DOB: 22-Feb-1946    ADMISSION DATE:  06/29/2014 CONSULTATION DATE:  06/29/2014  REFERRING MD :  [facility]  CHIEF COMPLAINT:  Cardiac arrest s/p right groin AV fistula/graft exanguination  INITIAL PRESENTATION: 68 y.o. F from [facility] transferred to [facility] ICU after she suffered a cardiac arrest when her right thigh AV fistula/graft bled out.  STUDIES:  8/1 CT Abd >>> no acute process, diverticulosis without acute diverticulitis, small b/l pleural effusions, multiple bilateral renal cysts. 8/6 CT Abd >>No acute retroperitoneal or intraperitoneal hemorrhage. Stable appearance of the abdomen and pelvis. Interval left femoral venous catheter placement.   SIGNIFICANT EVENTS: 8/1 seen in [facility] ED for abd pain 8/4 right groin AV fistula/graft bled out which led to cardiac arrest, transferred to [facility] ICU. 8/4 left HD cath placed 8/5 - HgB 5.4 transfused 2 units of blood 8/6 - HgB 6.7 transfused 2 units of blood, changed trach to XLT   BRIEF HISTORY:  [name] is a 68 y.o. 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  On 8/4, her right thigh AV fistula/graft began bleeding profusely.  This unfortunately led to a cardiac arrest.  Pt was successfully resuscitated and was subsequently transferred to [facility] ICU for further evaluation/workup.  Of note, pt's daughter states that right thigh has bled several times over the past few months and in fact, pt was seen in [facility] by IR who dilated the graft recently.   SUBJECTIVE:  Awake and alert this morning, still somnolent, but following commands, no complaints of pain.   VITAL SIGNS: Temp:  [97.8 F (36.6 C)-99.2 F (37.3 C)] 97.8 F (36.6 C) (08/06 1053) Pulse Rate:  [25-98] 72 (08/06 1115) Resp:  [9-30] 13 (08/06 1115) BP: (92-142)/(43-86) 142/86 mmHg (08/06 1115) SpO2:  [93 %-100 %] 100 % (08/06 1115) FiO2 (%):   [40 %] 40 % (08/06 1000) Weight:  [184 lb 1.4 oz (83.5 kg)-186 lb 4.6 oz (84.5 kg)] 186 lb 4.6 oz (84.5 kg) (08/06 0845) HEMODYNAMICS:   VENTILATOR SETTINGS: Vent Mode:  [-] PRVC FiO2 (%):  [40 %] 40 % Set Rate:  [18 bmp] 18 bmp Vt Set:  [500 mL] 500 mL PEEP:  [5 cmH20] 5 cmH20 Plateau Pressure:  [13 cmH20-19 cmH20] 13 cmH20 INTAKE / OUTPUT:  Intake/Output Summary (Last 24 hours) at 07/01/14 1131 Last data filed at 07/01/14 1053  Gross per 24 hour  Intake 3041.93 ml  Output   1309 ml  Net 1732.93 ml    PHYSICAL EXAMINATION: General: Obese female, resting in bed, in NAD. Neuro: Follows some commands, and converses. HEENT: [place]/AT. PERRL, sclerae anicteric.  Trach in place, C/D/I. Cardiovascular: RRR, no M/R/G.  Lungs: Respirations even and unlabored. Wheezing LUL, No W/R/R. On full vent support. Coughing frequently. Abdomen: BS x 4, soft, NT/ND.  PEG in place. Musculoskeletal: No gross deformities, no edema.  Skin: Intact, warm, no rashes.   LABS:  CBC  Recent Labs Lab 06/29/14 1114 06/30/14 0500 06/30/14 1030 07/01/14 0443  WBC 21.3* 8.2  --  4.4  HGB 6.9* 5.4* 10.4* 6.7*  HCT 21.4* 16.4* 31.1* 20.0*  PLT 257 183  --  110*   Coag's No results found for this basename: APTT, INR,  in the last 168 hours BMET  Recent Labs Lab 06/29/14 1114 06/30/14 0500 07/01/14 0443  NA 133* 134* 136*  K 4.7 5.3 3.7  CL 89* 92* 97  CO2 22 23 24   BUN 93* 99* 51*  CREATININE 5.50* 6.24* 3.57*  GLUCOSE 219* 78 86   Electrolytes  Recent Labs Lab 06/29/14 1114 06/30/14 0500 07/01/14 0443  CALCIUM 8.9 9.0 8.4  MG 2.1 2.0  --   PHOS 4.3 3.9 3.4   Sepsis Markers  Recent Labs Lab 06/29/14 1300 06/29/14 1713 06/29/14 2300  LATICACIDVEN 7.1* 5.0* 3.5*   ABG No results found for this basename: PHART, PCO2ART, PO2ART,  in the last 168 hours Liver Enzymes  Recent Labs Lab 06/26/14 2300 06/29/14 1114 07/01/14 0443  AST 14 31  --   ALT 13 27  --   ALKPHOS 899*  779*  --   BILITOT 0.4 0.3  --   ALBUMIN 3.8 2.7* 2.4*   Cardiac Enzymes  Recent Labs Lab 06/29/14 1113 06/29/14 1713 06/29/14 2300  TROPONINI <0.30 <0.30 <0.30   Glucose  Recent Labs Lab 06/30/14 1200 06/30/14 1607 06/30/14 2010 06/30/14 2358 07/01/14 0350 07/01/14 0801  GLUCAP 120* 137* 126* 95 91 97    Imaging Dg Chest Port 1 View  06/30/2014   CLINICAL DATA:  Assess airspace disease  EXAM: PORTABLE CHEST - 1 VIEW  COMPARISON:  06/29/2014  FINDINGS: Tracheostomy tube remains.  No cardiomegaly or change in upper mediastinal contours.  Improving left basilar aeration. There is left basilar atelectasis and pleural effusion based on preceding abdominal CT.  Marked osteopenia.  IMPRESSION: Partial clearance of left basilar atelectasis and small effusion.   Electronically Signed   By: Tiburcio Pea M.D.   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  Low tidal volumes, low return volumes MRSA PNA P:   Needs extra long trach for better ventilation: size 6 Full vent support for now. Positional trach - Will change trach to [place] #6 XLT  Wean as able. Continue Vancomycin as per ID section. Duonebs for wheezing PRN CXR  AM.  CARDIOVASCULAR CVL left femoral 06/29/14 A:  S/p cardiac arrest - presumed in setting right thigh AV graft hemorrhage. ? Hx of V.Tach - latest progress note from [facility] states pt is on Amiodarone and Diltiazem, though, this not seen anywhere else in chart. ? Endocarditis - was being treated for presumed endocarditis at [facility] (last dose of Abx  would have been on 8/12) P:  Levophed off as of 2200 yesterday Goal MAP > 65. Check cortisol. Stress steroids (on Decadron as outpatient for respiratory failure for the pass 1 month).  Will need prolong steroid wean.  Hold outpatient Midodrine for now. Hold off on amiodarone/diltiazem for now. Monitor WBC count after blood is transfused, consider ECHO is WBC trending up with fever  RENAL A:  [facility] on dialysis T/Th/Sat - very limited access, right thigh AV fistula initially bleeding, now stable.  Bilateral IJ's have had trouble in past, left groin has femoral TLC now converted to HD catheter. P:   Renal following - appreciated input.  Per Vascular - " Plan discussed with patient's daughter. Options of diatek vs graft revision discussed. Plan will be to revise graft tomorrow if  patient remains stable overnight"   GASTROINTESTINAL A:  Hep B surface antibody positive GI prophylaxis Nutrition P:   Pepcid. NPO after midnight per Vascular - surgery planned for tomorrow   HEMATOLOGIC A:  VTE prophylaxis Chronic anemia - low H\H today- transfused 2 units  P:  SCD's. Transfused 2unit of PRBC during HD Recheck H\H, check DIC panel.   INFECTIOUS A:  Blood Cx 7/18 and 7/21 >>> MRSA  Bacteremia Diarrhea P:   C.diff PCR >>>negative, previously positive, and currently on vanc taper Abx: Vanc  ENDOCRINE A:  DM ? AI - on decadron 4mg  BID as outpatient since 7/6  (for suspected chronic respiratory failure).  P:   CBG's q4hrs. SSI. Patient will require a slow taper (2-3 weeks) of steroids  Stress steroids,start wean 8/6, solucortef 50mg  IV q8 x 4 days, then reassess  NEUROLOGIC A:  TBI - s/p remote bilateral SDH's  Hx CVA Acute Encephalopathy Seizure disorder P:   Continue outpatient Keppra. Monitor.  TODAY'S SUMMARY: 68 y.o. F transferred from [facility] due to cardiac arrest after her right thigh AV fistula bled out.  Vascular and Renal consulted.  Currently on vent and  off Levophed.  She is on vanc for MRSA pneumonia and MSSA bacteremia and is C.Diff negative.  Now improving and alert, getting HD today and 4u PRBC. Diet NPO at midnight for vascular surgery tomorrow. Xtra long trach to be placed by CCM.    Critical Care time = 35mins  Stephanie AcreVishal Garen Woolbright, MD [place] [facility] Pager [phone] On Call Pager [phone]

## 2014-07-01 NOTE — Progress Notes (Addendum)
[facility] [place]  Subjective  - more responsive and interactive   Objective 135/76 75 98.7 F (37.1 C) (Oral) 18 100%  Intake/Output Summary (Last 24 hours) at 07/01/14 0909 Last data filed at 07/01/14 0800  Gross per 24 hour  Intake 3065.18 ml  Output   1309 ml  Net 1756.18 ml   Right thigh graft occluded No bleeding  No evidence of infection  Assessment/Planning: Thrombosed right thigh graft after bleeding on Tuesday.  Now off Levophed.  More stable overall still anemic. Plan for thrombectomy and revision of right thigh graft by my partner Dr Dickson tomorrow if remains stable Hold tube feeds midnight Consent   Eliette Drumwright E 07/01/2014 9:09 AM -- Addendum: Plan discussed with patient's daughter.  Options of diatek vs graft revision discussed.  Plan will be to revise graft tomorrow.  Daughter realizes that her mother is ill and has limited non perfect options.  Risk of graft infection, thrombosis other periprocedural events discussed.  Ajani Rineer, MD [facility] [place] Office: [phone] Pager: [phone]    Laboratory Lab Results:  Recent Labs  06/30/14 0500 06/30/14 1030 07/01/14 0443  WBC 8.2  --  4.4  HGB 5.4* 10.4* 6.7*  HCT 16.4* 31.1* 20.0*  PLT 183  --  110*   BMET  Recent Labs  06/30/14 0500 07/01/14 0443  NA 134* 136*  K 5.3 3.7  CL 92* 97  CO2 23 24  GLUCOSE 78 86  BUN 99* 51*  CREATININE 6.24* 3.57*  CALCIUM 9.0 8.4    COAG Lab Results  Component Value Date   INR 0.9 01/16/2008   INR 1.0 12/03/2007   INR 1.2 11/13/2007   No results found for this basename: PTT        

## 2014-07-01 NOTE — Progress Notes (Signed)
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 Penicillins Hives and Rash    Patient Measurements: Height:  (85kg) Weight: 186 lb 4.6 oz (84.5 kg) IBW/kg (Calculated) : 57  Vital Signs: Temp: 98.4 F (36.9 C) (08/06 1015) Temp src: Oral (08/06 1015) BP: 141/79 mmHg (08/06 1030) Pulse Rate: 72 (08/06 1030) Intake/Output from previous day: 08/05 0701 - 08/06 0700 In: 3229.2 [I.V.:1785.4; Blood:670; NG/GT:443.8; IV Piggyback:200] Out: 1309 [Stool:100] Intake/Output from this shift: Total I/O In: 480 [I.V.:40; Blood:335; NG/GT:105] Out: -   Labs:  Recent Labs  06/29/14 1114 06/30/14 0500 06/30/14 1030 07/01/14 0443  WBC 21.3* 8.2  --  4.4  HGB 6.9* 5.4* 10.4* 6.7*  PLT 257 183  --  110*  CREATININE 5.50* 6.24*  --  3.57*   Estimated Creatinine Clearance: 16.2 ml/min (by C-G formula based on Cr of 3.57).  Recent Labs  06/30/14 0500 07/01/14 0900  VANCORANDOM 10.4 23.8     Microbiology: Recent Results (from the past 720 hour(s))  URINE CULTURE     Status: None   Collection Time    06/26/14 11:35 PM      Result Value Ref Range Status   Specimen Description URINE, CATHETERIZED   Final   Special Requests CX ADDED AT 0143 ON [id number]   Final   Culture  Setup Time     Final   Value: 06/27/2014 02:41     Performed at [facility]   Colony Count     Final   Value: NO GROWTH     Performed at [facility]   Culture     Final   Value: NO GROWTH     Performed at [facility]   Report Status 06/28/2014 FINAL   Final  MRSA PCR SCREENING     Status: None   Collection Time    06/29/14 11:05 AM      Result Value Ref Range Status   MRSA by PCR NEGATIVE  NEGATIVE Final   Comment:            The GeneXpert MRSA Assay (FDA     approved for NASAL specimens     only), is one component of a     comprehensive MRSA colonization   surveillance program. It is not     intended to diagnose MRSA     infection nor to guide or     monitor treatment for     MRSA infections.  CLOSTRIDIUM DIFFICILE BY PCR     Status: None   Collection Time    06/29/14  1:19 PM      Result Value Ref Range Status   C difficile by pcr NEGATIVE  NEGATIVE Final    Anti-infectives   Start     Dose/Rate Route Frequency Ordered Stop   07/29/14 1000  vancomycin (VANCOCIN) 50 mg/mL oral solution 125 mg     125 mg Oral Every 3 DAYS 06/29/14 1200 08/13/14 0959   07/21/14 1000  vancomycin (VANCOCIN) 50 mg/mL oral solution 125 mg     125 mg Oral Every other day 06/29/14 1200 07/29/14 0959   07/14/14 1000  vancomycin (VANCOCIN) 50 mg/mL oral solution 125 mg     125 mg Oral Daily 06/29/14 1200 07/21/14 0959   07/06/14 2200  vancomycin (VANCOCIN) 50 mg/mL oral solution 125 mg     125 mg Oral 2 times daily 06/29/14  1200 07/13/14 2159   07/01/14 1044  vancomycin (VANCOCIN) 500 mg in sodium chloride 0.9 % 100 mL IVPB     500 mg 100 mL/hr over 60 Minutes Intravenous Every Dialysis 07/01/14 1044     06/30/14 1500  vancomycin (VANCOCIN) IVPB 1000 mg/200 mL premix  Status:  Discontinued     1,000 mg 200 mL/hr over 60 Minutes Intravenous Every Dialysis 06/30/14 0836 06/30/14 0841   06/30/14 0840  vancomycin (VANCOCIN) IVPB 1000 mg/200 mL premix     1,000 mg 200 mL/hr over 60 Minutes Intravenous Every Dialysis 06/30/14 0841     06/29/14 1400  vancomycin (VANCOCIN) 50 mg/mL oral solution 125 mg     125 mg Oral 4 times daily 06/29/14 1200 07/06/14 1459      Assessment: 68 YOF on total antibiotic day #23 with IV vancomycin for MRSA pneumonia, MSSA bacteremia, and 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 Plan per ID was for 4 weeks of vancomycin with planned stop date of 07/07/14.   WBC is now wnl. Patient is currently afebrile.  Pre-HD vancomycin level this AM was 23.8 - at goal. Patient receiving HD today for 3.5 hr at  BFR of 250 to get back on schedule.   Abx hx (Confirmed w/ [facility]) Vanc 7/22 >> (8/12) VR 8/5-10.4; Pre-HD VL 8/6 -23.8 Pre-HD VL 8/5 = 10.4 Linezolid 7/14 >>7/24 Levaquin 7/14 >>7/22 Vanc po 125 q6h 7/14 >>8/2, then 500 q6h 8/3 >> taper per CCM (hx cdiff on 9-09/2013, 3-02/2014)   Cx data from [facility]: (called lab) 06/06/14 Respiratory culture >> MRSA 06/09/14 Bld 2/2 >> MSSA 06/12/14 Bld 1/2 >> MSSA  06/14/14 Bld >> NEG 06/08/14 Cdiff +?  [facility] cx: 8/4 Cdiff NEG 8/4 MRSA PCR NEG  Goal of Therapy:  Pre HD vancomycin level 15-25  Plan:  1. Will give lower dose of Vancomycin 500 mg IV post HD today d/t lower BFR/shorter time on HD and to keep at goal.   2. Follow-up HD schedule per nephrology for further dosing.  3. Monitor clinical status and further culture results.   Link Snuffer, PharmD, BCPS Clinical Pharmacist [phone] 07/01/2014,10:46 AM

## 2014-07-02 ENCOUNTER — Encounter (HOSPITAL_COMMUNITY): Payer: Medicare Other | Admitting: Anesthesiology

## 2014-07-02 ENCOUNTER — Inpatient Hospital Stay (HOSPITAL_COMMUNITY): Payer: Medicare Other

## 2014-07-02 ENCOUNTER — Encounter (HOSPITAL_COMMUNITY): Payer: Self-pay | Admitting: Anesthesiology

## 2014-07-02 ENCOUNTER — Encounter (HOSPITAL_COMMUNITY)
Admission: EM | Disposition: A | Discharge status: Skilled Nursing Facility | Payer: Self-pay | Source: Other Acute Inpatient Hospital | Attending: Pulmonary Disease

## 2014-07-02 ENCOUNTER — Inpatient Hospital Stay (HOSPITAL_COMMUNITY): Payer: Medicare Other | Admitting: Anesthesiology

## 2014-07-02 DIAGNOSIS — I469 Cardiac arrest, cause unspecified: Secondary | ICD-10-CM | POA: Diagnosis not present

## 2014-07-02 DIAGNOSIS — T82598A Other mechanical complication of other cardiac and vascular devices and implants, initial encounter: Secondary | ICD-10-CM | POA: Diagnosis not present

## 2014-07-02 DIAGNOSIS — T82898A Other specified complication of vascular prosthetic devices, implants and grafts, initial encounter: Secondary | ICD-10-CM

## 2014-07-02 HISTORY — PX: THROMBECTOMY AND REVISION OF ARTERIOVENTOUS (AV) GORETEX  GRAFT: SHX6120

## 2014-07-02 LAB — GLUCOSE, CAPILLARY
GLUCOSE-CAPILLARY: 94 mg/dL (ref 70–99)
GLUCOSE-CAPILLARY: 95 mg/dL (ref 70–99)
GLUCOSE-CAPILLARY: 97 mg/dL (ref 70–99)
Glucose-Capillary: 68 mg/dL — ABNORMAL LOW (ref 70–99)
Glucose-Capillary: 93 mg/dL (ref 70–99)
Glucose-Capillary: 77 mg/dL (ref 70–99)
Glucose-Capillary: 69 mg/dL — ABNORMAL LOW (ref 70–99)
Glucose-Capillary: 92 mg/dL (ref 70–99)
Glucose-Capillary: 85 mg/dL (ref 70–99)
Glucose-Capillary: 93 mg/dL (ref 70–99)

## 2014-07-02 LAB — RENAL FUNCTION PANEL
ANION GAP: 13 (ref 5–15)
Albumin: 2.8 g/dL — ABNORMAL LOW (ref 3.5–5.2)
BUN: 33 mg/dL — ABNORMAL HIGH (ref 6–23)
CO2: 27 meq/L (ref 19–32)
Calcium: 8.1 mg/dL — ABNORMAL LOW (ref 8.4–10.5)
Chloride: 97 mEq/L (ref 96–112)
Creatinine, Ser: 2.58 mg/dL — ABNORMAL HIGH (ref 0.50–1.10)
GFR calc Af Amer: 21 mL/min — ABNORMAL LOW (ref >90)
GFR calc non Af Amer: 18 mL/min — ABNORMAL LOW (ref >90)
GLUCOSE: 71 mg/dL (ref 70–99)
POTASSIUM: 3.5 meq/L — AB (ref 3.7–5.3)
Phosphorus: 3.1 mg/dL (ref 2.3–4.6)
SODIUM: 137 meq/L (ref 137–147)

## 2014-07-02 LAB — CBC
HCT: 27.3 % — ABNORMAL LOW (ref 36.0–46.0)
Hemoglobin: 9.2 g/dL — ABNORMAL LOW (ref 12.0–15.0)
MCH: 28.8 pg (ref 26.0–34.0)
MCHC: 33.7 g/dL (ref 30.0–36.0)
MCV: 85.6 fL (ref 78.0–100.0)
Platelets: 109 10*3/uL — ABNORMAL LOW (ref 150–400)
RBC: 3.19 MIL/uL — ABNORMAL LOW (ref 3.87–5.11)
RDW: 16.5 % — AB (ref 11.5–15.5)
WBC: 4.9 10*3/uL (ref 4.0–10.5)

## 2014-07-02 LAB — TYPE AND SCREEN
ABO/RH(D): A POS
ANTIBODY SCREEN: NEGATIVE
UNIT DIVISION: 0
UNIT DIVISION: 0
UNIT DIVISION: 0
Unit division: 0

## 2014-07-02 SURGERY — THROMBECTOMY AND REVISION OF ARTERIOVENTOUS (AV) GORETEX  GRAFT
Anesthesia: General | Site: Neck | Laterality: Right

## 2014-07-02 MED ORDER — SODIUM CHLORIDE 0.9 % IV SOLN
INTRAVENOUS | Status: DC | PRN
  Administered 2014-07-02: 12:00:00 via INTRAVENOUS

## 2014-07-02 MED ORDER — ONDANSETRON HCL 4 MG/2ML IJ SOLN
INTRAMUSCULAR | Status: AC
  Filled 2014-07-02: qty 2

## 2014-07-02 MED ORDER — LEVETIRACETAM 100 MG/ML PO SOLN: 250.0000 mg | Freq: Two times a day (BID) | ORAL | Status: DC

## 2014-07-02 MED ORDER — MIDAZOLAM HCL 2 MG/2ML IJ SOLN
INTRAMUSCULAR | Status: AC
  Filled 2014-07-02: qty 2

## 2014-07-02 MED ORDER — HEPARIN SODIUM (PORCINE) 1000 UNIT/ML IJ SOLN
INTRAMUSCULAR | Status: AC
  Filled 2014-07-02: qty 1

## 2014-07-02 MED ORDER — DEXTROSE 50 % IV SOLN
INTRAVENOUS | Status: AC
Start: 2014-07-02 — End: 2014-07-02
  Filled 2014-07-02: qty 50

## 2014-07-02 MED ORDER — PROPOFOL 10 MG/ML IV BOLUS
INTRAVENOUS | Status: DC | PRN
  Administered 2014-07-02: 100 mg via INTRAVENOUS

## 2014-07-02 MED ORDER — 0.9 % SODIUM CHLORIDE (POUR BTL) OPTIME
TOPICAL | Status: DC | PRN
  Administered 2014-07-02: 1000 mL

## 2014-07-02 MED ORDER — DEXTROSE 10 % IV SOLN
INTRAVENOUS | Status: DC
  Administered 2014-07-02: 500 mL via INTRAVENOUS

## 2014-07-02 MED ORDER — DEXAMETHASONE 4 MG PO TABS
4.0000 mg | ORAL_TABLET | Freq: Every day | ORAL | Status: DC
  Administered 2014-07-03 – 2014-07-04 (×2): 4 mg via ORAL
  Filled 2014-07-02 (×2): qty 1

## 2014-07-02 MED ORDER — ACETAMINOPHEN 160 MG/5ML PO SOLN: 650.0000 mg | Freq: Four times a day (QID) | ORAL | Status: DC | PRN

## 2014-07-02 MED ORDER — LIDOCAINE HCL (CARDIAC) 20 MG/ML IV SOLN
INTRAVENOUS | Status: AC
  Filled 2014-07-02: qty 5

## 2014-07-02 MED ORDER — POLYVINYL ALCOHOL 1.4 % OP SOLN
1.0000 [drp] | OPHTHALMIC | Status: DC | PRN
  Filled 2014-07-02: qty 15

## 2014-07-02 MED ORDER — FENTANYL CITRATE 0.05 MG/ML IJ SOLN
INTRAMUSCULAR | Status: DC | PRN
  Administered 2014-07-02: 100 ug via INTRAVENOUS
  Administered 2014-07-02 (×2): 50 ug via INTRAVENOUS

## 2014-07-02 MED ORDER — EPHEDRINE SULFATE 50 MG/ML IJ SOLN
INTRAMUSCULAR | Status: DC | PRN
  Administered 2014-07-02 (×3): 10 mg via INTRAVENOUS

## 2014-07-02 MED ORDER — MODAFINIL 100 MG PO TABS
200.0000 mg | ORAL_TABLET | Freq: Every day | ORAL | Status: DC
  Administered 2014-07-02 – 2014-07-04 (×3): 200 mg via ORAL
  Filled 2014-07-02 (×3): qty 2

## 2014-07-02 MED ORDER — VANCOMYCIN HCL 1000 MG IV SOLR
1000.0000 mg | INTRAVENOUS | Status: DC | PRN
  Administered 2014-07-02: 500 mg via INTRAVENOUS

## 2014-07-02 MED ORDER — PROTAMINE SULFATE 10 MG/ML IV SOLN
INTRAVENOUS | Status: DC | PRN
  Administered 2014-07-02 (×3): 10 mg via INTRAVENOUS

## 2014-07-02 MED ORDER — FENTANYL CITRATE 0.05 MG/ML IJ SOLN
INTRAMUSCULAR | Status: AC
  Filled 2014-07-02: qty 5

## 2014-07-02 MED ORDER — PHENYLEPHRINE 40 MCG/ML (10ML) SYRINGE FOR IV PUSH (FOR BLOOD PRESSURE SUPPORT)
PREFILLED_SYRINGE | INTRAVENOUS | Status: AC
  Filled 2014-07-02: qty 10

## 2014-07-02 MED ORDER — THROMBIN 20000 UNITS EX SOLR
CUTANEOUS | Status: AC
  Filled 2014-07-02: qty 20000

## 2014-07-02 MED ORDER — ONDANSETRON HCL 4 MG/2ML IJ SOLN
INTRAMUSCULAR | Status: DC | PRN
  Administered 2014-07-02: 4 mg via INTRAVENOUS

## 2014-07-02 MED ORDER — MIDODRINE HCL 5 MG PO TABS: 10.0000 mg | ORAL_TABLET | ORAL | Status: DC | PRN

## 2014-07-02 MED ORDER — PHENYLEPHRINE HCL 10 MG/ML IJ SOLN
10.0000 mg | INTRAVENOUS | Status: DC | PRN
  Administered 2014-07-02: 10 ug/min via INTRAVENOUS

## 2014-07-02 MED ORDER — HEPARIN SODIUM (PORCINE) 1000 UNIT/ML IJ SOLN
INTRAMUSCULAR | Status: DC | PRN
  Administered 2014-07-02: 6000 [IU] via INTRAVENOUS

## 2014-07-02 MED ORDER — DEXTROSE 50 % IV SOLN
12.5000 g | Freq: Once | INTRAVENOUS | Status: AC
  Administered 2014-07-02: 12.5 g via INTRAVENOUS

## 2014-07-02 MED ORDER — DARBEPOETIN ALFA-POLYSORBATE 60 MCG/0.3ML IJ SOLN: 60.0000 ug | INTRAMUSCULAR | Status: DC

## 2014-07-02 MED ORDER — LIDOCAINE-EPINEPHRINE (PF) 1 %-1:200000 IJ SOLN
INTRAMUSCULAR | Status: AC
  Filled 2014-07-02: qty 10

## 2014-07-02 MED ORDER — VITAMINS A & D EX OINT
1.0000 "application " | TOPICAL_OINTMENT | Freq: Two times a day (BID) | CUTANEOUS | Status: DC
  Administered 2014-07-02 – 2014-07-04 (×4): 1 via TOPICAL
  Filled 2014-07-02: qty 5

## 2014-07-02 MED ORDER — HYDROCORTISONE NA SUCCINATE PF 100 MG IJ SOLR
INTRAMUSCULAR | Status: DC | PRN
  Administered 2014-07-02: 50 mg via INTRAVENOUS

## 2014-07-02 MED ORDER — PROPOFOL 10 MG/ML IV BOLUS: INTRAVENOUS | Status: DC | PRN

## 2014-07-02 MED ORDER — POLYETHYLENE GLYCOL 3350 17 G PO PACK
17.0000 g | PACK | Freq: Every day | ORAL | Status: DC | PRN
  Filled 2014-07-02: qty 1

## 2014-07-02 MED ORDER — PROTAMINE SULFATE 10 MG/ML IV SOLN
INTRAVENOUS | Status: AC
  Filled 2014-07-02: qty 5

## 2014-07-02 MED ORDER — LIDOCAINE HCL (CARDIAC) 20 MG/ML IV SOLN: INTRAVENOUS | Status: DC | PRN

## 2014-07-02 MED ORDER — IPRATROPIUM-ALBUTEROL 0.5-2.5 (3) MG/3ML IN SOLN: 3.0000 mL | Freq: Four times a day (QID) | RESPIRATORY_TRACT | Status: DC | PRN

## 2014-07-02 MED ORDER — SODIUM CHLORIDE 0.9 % IR SOLN
Status: DC | PRN
  Administered 2014-07-02: 12:00:00

## 2014-07-02 MED ORDER — CEPHALEXIN 250 MG/5ML PO SUSR
500.0000 mg | Freq: Two times a day (BID) | ORAL | Status: DC
  Administered 2014-07-02 – 2014-07-04 (×4): 500 mg
  Filled 2014-07-02 (×5): qty 10

## 2014-07-02 MED ORDER — PHENYLEPHRINE HCL 10 MG/ML IJ SOLN
INTRAMUSCULAR | Status: DC | PRN
  Administered 2014-07-02: 40 ug via INTRAVENOUS

## 2014-07-02 MED ORDER — HYDROCORTISONE NA SUCCINATE PF 100 MG IJ SOLR: INTRAMUSCULAR | Status: DC | PRN

## 2014-07-02 MED ORDER — ONDANSETRON HCL 4 MG/2ML IJ SOLN
4.0000 mg | Freq: Four times a day (QID) | INTRAMUSCULAR | Status: DC | PRN
  Administered 2014-07-03: 4 mg via INTRAVENOUS
  Filled 2014-07-02: qty 2

## 2014-07-02 MED ORDER — CHLORHEXIDINE GLUCONATE 0.12 % MT SOLN: 15.0000 mL | Freq: Two times a day (BID) | OROMUCOSAL | Status: DC

## 2014-07-02 MED ORDER — CEFAZOLIN SODIUM-DEXTROSE 2-3 GM-% IV SOLR
INTRAVENOUS | Status: AC
  Filled 2014-07-02: qty 50

## 2014-07-02 SURGICAL SUPPLY — 61 items
ARMBAND PINK RESTRICT EXTREMIT (MISCELLANEOUS) ×4 IMPLANT
BAG DECANTER FOR FLEXI CONT (MISCELLANEOUS) ×4 IMPLANT
BLADE 10 SAFETY STRL DISP (BLADE) ×4 IMPLANT
CANISTER SUCTION 2500CC (MISCELLANEOUS) ×4 IMPLANT
CATH CANNON HEMO 15F 50CM (CATHETERS) IMPLANT
CATH CANNON HEMO 15FR 19 (HEMODIALYSIS SUPPLIES) IMPLANT
CATH CANNON HEMO 15FR 23CM (HEMODIALYSIS SUPPLIES) IMPLANT
CATH CANNON HEMO 15FR 31CM (HEMODIALYSIS SUPPLIES) IMPLANT
CATH CANNON HEMO 15FR 32CM (HEMODIALYSIS SUPPLIES) IMPLANT
CATH EMB 4FR 80CM (CATHETERS) ×12 IMPLANT
CHLORAPREP W/TINT 26ML (MISCELLANEOUS) ×4 IMPLANT
CLIP TI MEDIUM 6 (CLIP) ×4 IMPLANT
CLIP TI WIDE RED SMALL 6 (CLIP) ×4 IMPLANT
COVER PROBE W GEL 5X96 (DRAPES) IMPLANT
COVER SURGICAL LIGHT HANDLE (MISCELLANEOUS) ×4 IMPLANT
DERMABOND ADVANCED (GAUZE/BANDAGES/DRESSINGS) ×2
DERMABOND ADVANCED .7 DNX12 (GAUZE/BANDAGES/DRESSINGS) ×2 IMPLANT
DRAPE C-ARM 42X72 X-RAY (DRAPES) ×4 IMPLANT
DRAPE CHEST BREAST 15X10 FENES (DRAPES) ×4 IMPLANT
DRAPE X-RAY CASS 24X20 (DRAPES) IMPLANT
ELECT REM PT RETURN 9FT ADLT (ELECTROSURGICAL) ×4
ELECTRODE REM PT RTRN 9FT ADLT (ELECTROSURGICAL) ×2 IMPLANT
GAUZE SPONGE 2X2 8PLY STRL LF (GAUZE/BANDAGES/DRESSINGS) ×2 IMPLANT
GAUZE SPONGE 4X4 16PLY XRAY LF (GAUZE/BANDAGES/DRESSINGS) ×8 IMPLANT
GEL ULTRASOUND 20GR AQUASONIC (MISCELLANEOUS) IMPLANT
GLOVE BIO SURGEON STRL SZ7.5 (GLOVE) ×4 IMPLANT
GLOVE BIOGEL PI IND STRL 8 (GLOVE) ×2 IMPLANT
GLOVE BIOGEL PI INDICATOR 8 (GLOVE) ×2
GOWN STRL REUS W/ TWL LRG LVL3 (GOWN DISPOSABLE) ×6 IMPLANT
GOWN STRL REUS W/TWL LRG LVL3 (GOWN DISPOSABLE) ×6
GRAFT GORETEX STND 4X7 (Vascular Products) ×4 IMPLANT
GRAFT GORETEXSTD 4X7 (Vascular Products) ×2 IMPLANT
KIT BASIN OR (CUSTOM PROCEDURE TRAY) ×4 IMPLANT
KIT ROOM TURNOVER OR (KITS) ×4 IMPLANT
NEEDLE 18GX1X1/2 (RX/OR ONLY) (NEEDLE) ×4 IMPLANT
NEEDLE 22X1 1/2 (OR ONLY) (NEEDLE) ×4 IMPLANT
NEEDLE HYPO 25GX1X1/2 BEV (NEEDLE) ×4 IMPLANT
NS IRRIG 1000ML POUR BTL (IV SOLUTION) ×4 IMPLANT
PACK CV ACCESS (CUSTOM PROCEDURE TRAY) ×4 IMPLANT
PACK SURGICAL SETUP 50X90 (CUSTOM PROCEDURE TRAY) ×4 IMPLANT
PAD ARMBOARD 7.5X6 YLW CONV (MISCELLANEOUS) ×8 IMPLANT
SET COLLECT BLD 21X3/4 12 (NEEDLE) IMPLANT
SPONGE GAUZE 2X2 STER 10/PKG (GAUZE/BANDAGES/DRESSINGS) ×2
SPONGE SURGIFOAM ABS GEL 100 (HEMOSTASIS) IMPLANT
STOPCOCK 4 WAY LG BORE MALE ST (IV SETS) IMPLANT
SUCTION FRAZIER TIP 10 FR DISP (SUCTIONS) IMPLANT
SUT ETHILON 3 0 PS 1 (SUTURE) ×4 IMPLANT
SUT ETHILON 4 0 PS 2 18 (SUTURE) ×4 IMPLANT
SUT PROLENE 6 0 BV (SUTURE) ×12 IMPLANT
SUT VIC AB 3-0 SH 27 (SUTURE) ×6
SUT VIC AB 3-0 SH 27X BRD (SUTURE) ×6 IMPLANT
SUT VICRYL 4-0 PS2 18IN ABS (SUTURE) ×12 IMPLANT
SYR 20CC LL (SYRINGE) ×8 IMPLANT
SYR 5ML LL (SYRINGE) ×8 IMPLANT
SYR CONTROL 10ML LL (SYRINGE) ×4 IMPLANT
SYRINGE 10CC LL (SYRINGE) ×4 IMPLANT
TOWEL OR 17X24 6PK STRL BLUE (TOWEL DISPOSABLE) ×4 IMPLANT
TOWEL OR 17X26 10 PK STRL BLUE (TOWEL DISPOSABLE) ×4 IMPLANT
TUBING EXTENTION W/L.L. (IV SETS) IMPLANT
UNDERPAD 30X30 INCONTINENT (UNDERPADS AND DIAPERS) ×4 IMPLANT
WATER STERILE IRR 1000ML POUR (IV SOLUTION) ×4 IMPLANT

## 2014-07-02 NOTE — Progress Notes (Signed)
Rt transport vent from PACU to 2M07. Pt vitals stable.

## 2014-07-02 NOTE — Interval H&P Note (Signed)
History and Physical Interval Note:  07/02/2014 11:23 AM  Christina HeadySandra A Olesen  has presented today for surgery, with the diagnosis of occluded right thigh graft  The various methods of treatment have been discussed with the patient and family. After consideration of risks, benefits and other options for treatment, the patient has consented to  Procedure(s): THROMBECTOMY AND REVISION OF ARTERIOVENTOUS (AV) GORETEX  GRAFT-RIGHT THIGH (Right) POSSIBLE INSERTION OF TUNNELED DIALYSIS CATHETER (N/A) as a surgical intervention .  The patient's history has been reviewed, patient examined, no change in status, stable for surgery.  I have reviewed the patient's chart and labs.  Questions were answered to the patient's satisfaction.     DICKSON,CHRISTOPHER S

## 2014-07-02 NOTE — Progress Notes (Signed)
S:trached O:BP 126/89  Pulse 64  Temp(Src) 97.6 F (36.4 C) (Axillary)  Resp 16  Ht 5\' 5"  (1.651 m)  Wt 84.1 kg (185 lb 6.5 oz)  BMI 30.85 kg/m2  SpO2 100%  Intake/Output Summary (Last 24 hours) at 07/02/14 16100722 Last data filed at 07/02/14 0600  Gross per 24 hour  Intake   1813 ml  Output   1430 ml  Net    383 ml   Weight change: 0 kg (0 lb) Gen: somnolent CVS:RRR Resp:clear  Abd:+ BS NTND Ext:No edema   Rt thigh AVG No bruit NEURO: follows commands.  Mouths words Lt fem HD cath  . antiseptic oral rinse  7 mL Mouth Rinse QID  . chlorhexidine  15 mL Mouth Rinse BID  . famotidine  20 mg Per Tube Daily  . feeding supplement (PRO-STAT SUGAR FREE 64)  30 mL Per Tube TID  . feeding supplement (VITAL HIGH PROTEIN)  1,000 mL Per Tube Q24H  . hydrocortisone sod succinate (SOLU-CORTEF) inj  50 mg Intravenous Q8H   Followed by  . [START ON 07/05/2014] hydrocortisone sod succinate (SOLU-CORTEF) inj  50 mg Intravenous Q12H   Followed by  . [START ON 07/09/2014] hydrocortisone sod succinate (SOLU-CORTEF) inj  25 mg Intravenous Q12H   Followed by  . [START ON 07/13/2014] hydrocortisone sod succinate (SOLU-CORTEF) inj  25 mg Intravenous Daily  . levETIRAcetam  250 mg Per Tube BID  . midazolam  2 mg Intravenous Once  . vancomycin  125 mg Oral QID   Followed by  . [START ON 07/06/2014] vancomycin  125 mg Oral BID   Followed by  . [START ON 07/14/2014] vancomycin  125 mg Oral Daily   Followed by  . [START ON 07/21/2014] vancomycin  125 mg Oral QODAY   Followed by  . [START ON 07/29/2014] vancomycin  125 mg Oral Q3 days   Ct Abdomen Pelvis Wo Contrast  07/01/2014   CLINICAL DATA:  Significant anemia and suspicion of acute retroperitoneal hemorrhage.  EXAM: CT ABDOMEN AND PELVIS WITHOUT CONTRAST  TECHNIQUE: Multidetector CT imaging of the abdomen and pelvis was performed following the standard protocol without IV contrast.  COMPARISON:  06/27/2014  FINDINGS: Lungs show bibasilar atelectasis,  left greater than right. There is a trace amount of left pleural fluid.  Unenhanced appearance of the abdomen and pelvis shows no evidence of acute retroperitoneal or intraperitoneal hemorrhage. Unenhanced appearance of the liver, pancreas, spleen, kidneys and bowel are stable and without significant findings. There are stable bilateral hyperdense lesions of the kidneys presumably representing hyperdense cysts.  The kidneys are also very atrophic consistent with known end-stage renal disease.  Left femoral venous catheter present extending into the proximal left external iliac vein. Dialysis graft partially visualized in the right thigh. Rectal tube present.  No evidence of bowel obstruction, significant ileus or free intraperitoneal air. No hernias are identified. Stable atherosclerosis of the abdominal aorta without evidence of aneurysm. No mass lesions or enlarged lymph nodes. Stable densely calcified uterine fibroids.  IMPRESSION: No acute retroperitoneal or intraperitoneal hemorrhage. Otherwise stable appearance of the abdomen and pelvis. Interval left femoral venous catheter placement.   Electronically Signed   By: Irish LackGlenn  Yamagata M.D.   On: 07/01/2014 07:55   Dg Chest Port 1 View  07/02/2014   CLINICAL DATA:  Effusion.  Tracheotomy.  EXAM: PORTABLE CHEST - 1 VIEW  COMPARISON:  06/30/2014  FINDINGS: Tracheostomy tube remains seated. Unchanged cardiomegaly and upper mediastinal widening given differences in technique.  Lower lung volumes. Hazy basilar densities, left more than right, atelectasis and small volume effusion based on CT from yesterday. No pneumothorax.  IMPRESSION: 1. Lower lung volumes. 2. Unchanged basilar opacities, atelectasis and small effusions by recent CT.   Electronically Signed   By: Tiburcio Pea M.D.   On: 07/02/2014 06:25   BMET    Component Value Date/Time   NA 137 07/02/2014 0300   K 3.5* 07/02/2014 0300   CL 97 07/02/2014 0300   CO2 27 07/02/2014 0300   GLUCOSE 71 07/02/2014 0300    BUN 33* 07/02/2014 0300   CREATININE 2.58* 07/02/2014 0300   CALCIUM 8.1* 07/02/2014 0300   GFRNONAA 18* 07/02/2014 0300   GFRAA 21* 07/02/2014 0300   CBC    Component Value Date/Time   WBC 4.9 07/02/2014 0300   RBC 3.19* 07/02/2014 0300   HGB 9.2* 07/02/2014 0300   HCT 27.3* 07/02/2014 0300   PLT 109* 07/02/2014 0300   MCV 85.6 07/02/2014 0300   MCH 28.8 07/02/2014 0300   MCHC 33.7 07/02/2014 0300   RDW 16.5* 07/02/2014 0300   LYMPHSABS 0.5* 07/01/2014 0443   MONOABS 0.3 07/01/2014 0443   EOSABS 0.0 07/01/2014 0443   BASOSABS 0.0 07/01/2014 0443     Assessment: 1. Acute blood loss anemia from bleeding AVG.  I don't think the Hg of 10.4 yest was a true value 2. VDRF 3. ESRD 4. Hypotension, improved, off levophed Plan: 1.For declot/revision of AVG today 2. Plan HD in AM  Naquan Garman T

## 2014-07-02 NOTE — Anesthesia Procedure Notes (Signed)
Procedure Name: Intubation Date/Time: 07/02/2014 12:22 PM Performed by: Romie MinusOCK, Zaiden Ludlum K Pre-anesthesia Checklist: Patient identified, Emergency Drugs available, Suction available, Patient being monitored and Timeout performed Patient Re-evaluated:Patient Re-evaluated prior to inductionOxygen Delivery Method: Circle system utilized Preoxygenation: Pre-oxygenation with 100% oxygen Intubation Type: Combination inhalational/ intravenous induction and Tracheostomy Airway Equipment and Method: Tracheostomy Placement Confirmation: positive ETCO2,  CO2 detector and breath sounds checked- equal and bilateral Dental Injury: Teeth and Oropharynx as per pre-operative assessment

## 2014-07-02 NOTE — Progress Notes (Signed)
Weaned pt. fio2 to 35%.

## 2014-07-02 NOTE — Care Management Note (Signed)
    Page 1 of 2   07/04/2014     6:04:41 PM CARE MANAGEMENT NOTE 07/04/2014  Patient:  [name],[name]   Account Number:  [id number]  Date Initiated:  06/29/2014  Documentation initiated by:  [facility]  Subjective/Objective Assessment:   Admitted with bleeding - Coded.     Action/Plan:   Anticipated DC Date:  07/06/2014   Anticipated DC Plan:  LONG TERM ACUTE CARE (LTAC)  In-house referral  Clinical Social Worker      DC Planning Services  CM consult      Choice offered to / List presented to:             Status of service:  Completed, signed off Medicare Important Message given?  YES (If response is "NO", the following Medicare IM given date fields will be blank) Date Medicare IM given:  07/02/2014 Medicare IM given by:  [facility]iy[facility] C[facility] Date Additional Medicare IM given:   Additional Medicare IM given by:    Discharge Disposition:  LONG TERM ACUTE CARE (LTAC)  Per UR Regulation:  Reviewed for med. necessity/level of care/duration of stay  If discussed at Long Length of Stay Meetings, dates discussed:    Comments:  07/04/14 14:00 CM received callback and brought packet for RN to send along with pt including [facility] transfer report, Ltac transfer report, copy of ACCESS picture for dialysis. CM gave RN number to call report to [facility] nurse [phone], receiving MD is VAN EYK; No DC summary written yet; [facility] number on packet along with all aforemetioned information. [facility] to [phone].  No other CM needs were communicated. [name], [name], [name].   07/04/14 09:30 CM received call from RN pt ready to go back to [facility].  CM called Olegario MessierKathy at [facility].  CM faxed MAR, facesheet, and last progress note.  Olegario MessierKathy states she will review and give me [name].  Waiting for callback.  [name], [name], [name].  ContactArman Bogus:  Proctor,Angela Daughter [phone]  Judene CompanionMcCluney,Quavias Son [phone]  07-02-14 3:30pm Avie ArenasSarah Brown, RNBSN [phone] From [facility] - [place] If stable over w/e can return to [facility].  If can go over weekend please call Olegario MessierKathy at [phone] - fax [phone].    Talked with daughter [name], updated on possibilty of return to [facility] on Sat 8-8 or 8-9.  Is fine with that and she is glad she is progressing.  CM will continue to follow.

## 2014-07-02 NOTE — Anesthesia Postprocedure Evaluation (Signed)
  Anesthesia Post-op Note  Patient: [name]  Procedure(s) Performed: Procedure(s): THROMBECTOMY AND REVISION OF VENOUS SIDE OF ARTERIOVENTOUS (AV) GORETEX  GRAFT-RIGHT THIGH (Right)  Patient Location: PACU  Anesthesia Type:General  Level of Consciousness: sleepy, arousable  Airway and Oxygen Therapy: on pressure support with trach collar  Post-op Pain: none  Post-op Assessment: Post-op Vital signs reviewed, Patient's Cardiovascular Status Stable, Respiratory Function Stable, Patent Airway and Pain level controlled  Post-op Vital Signs: stable  Last Vitals:  Filed Vitals:   07/02/14 1559  BP:   Pulse:   Temp: 36.5 C  Resp:     Complications: No apparent anesthesia complications

## 2014-07-02 NOTE — Progress Notes (Signed)
Patient from unit. Patient NSR on monitor, 99% on 4 lpm via blow by trach, Suctioned x 2 with return of blood tinged sputum. Per floor nurse this has been normal

## 2014-07-02 NOTE — Anesthesia Preprocedure Evaluation (Addendum)
Anesthesia Evaluation  Patient identified by MRN, date of birth, ID band Patient awake    Reviewed: Allergy & Precautions, H&P , NPO status , Patient's Chart, lab work & pertinent test results  History of Anesthesia Complications Negative for: history of anesthetic complications  Airway       Dental  (+) Edentulous Upper, Edentulous Lower   Pulmonary former smoker,    + decreased breath sounds      Cardiovascular hypertension, Pt. on medications Rhythm:Regular Rate:Normal     Neuro/Psych CVA, Residual Symptoms    GI/Hepatic negative GI ROS, Neg liver ROS,   Endo/Other  diabetes, Type 2, Insulin Dependent  Renal/GU ESRF and DialysisRenal disease     Musculoskeletal   Abdominal   Peds  Hematology   Anesthesia Other Findings   Reproductive/Obstetrics negative OB ROS                        Anesthesia Physical Anesthesia Plan  ASA: III  Anesthesia Plan: General   Post-op Pain Management:    Induction: Intravenous and Inhalational  Airway Management Planned: Tracheostomy  Additional Equipment:   Intra-op Plan:   Post-operative Plan: Post-operative intubation/ventilation  Informed Consent: I have reviewed the patients History and Physical, chart, labs and discussed the procedure including the risks, benefits and alternatives for the proposed anesthesia with the patient or authorized representative who has indicated his/her understanding and acceptance.     Plan Discussed with: CRNA, Anesthesiologist and Surgeon  Anesthesia Plan Comments:         Anesthesia Quick Evaluation

## 2014-07-02 NOTE — Op Note (Signed)
NAME: Christina HeadySandra A [name]   MRN: [id number] DOB: 1946-09-19    DATE OF OPERATION: 07/02/2014  PREOP DIAGNOSIS: clotted right thigh AV graft  POSTOP DIAGNOSIS: same  PROCEDURE:  1. Thrombectomy of right thigh AV graft and revision of graft  2. Replacement of venous half of graft with 7 mm PTFE 3. 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 Edilia Boickson, MD, FACS  ASSIST: Lianne CureMaureen Collins PA, Karsten RoKim Trinh, [employer]  ANESTHESIA: Gen.   EBL: minimal  INDICATIONS: [name] is a 68 y.o. female who had a bleeding episode from the venous aspect of her left thigh AV graft. The graft subsequently thrombosed. She was set up for thrombectomy and revision.  FINDINGS: the graft was thrombectomized and the arterial plug was retrieved. 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 I replaced the venous half of the graft and excise the area where she had bled.  TECHNIQUE: The patient was taken to the operating room and received a general anesthetic. The right groin was prepped and draped in usual sterile fashion in the right side. An incision was made below the inguinal crease along the venous half of the graft medially. The graft here was dissected free. A separate longitudinal incision was made over the graft at the distal aspect of the loop. The graft here was dissected free. A tunnel was created between the 2 incisions medially to the old graft where she had bled. A 7 mm PTFE graft was tunneled between the 2 incisions and the patient was heparinized. The graft was then divided at both ends. At the arterial end, graft thrombectomy was performed using a number 4 Fogarty catheter. Of note, the catheter exited through the graft at the site of an aneurysm which had been cannulated for a recent fistulogram. Therefore this had to be  repaired. I excised an ellipse of skin over this area and dissected free the graft. The graft was closed primarily with running 6-0 Prolene suture. I then closed the skin over the graft with 3-0 Vicryl and then the skin with 4-0 nylon. The arterial plug was retrieved and excellent inflow established. This was then flushed with heparinized saline and clamped. The graft was spatulated and the new segment of graft spatulated and sewn end to end at the distal loop of the graft with continuous 6-0 Prolene suture. Attention was then turned to the venous end. Venous thrombectomy was performed and there was good backbleeding. Once no further clot was retrieved the graft was flushed with heparinized saline and clamped. The graft was then cut in spatulated and the new segment of graft was cut to the appropriate length, spatulated, and sewn end-to-end to the venous limb of the graft using continuous 6-0 Prolene suture. At the completion was a thrill in the graft. Hemostasis was obtained in the wounds and the heparin was partially reversed with protamine. The wounds were closed the deep layer 3-0 Vicryl and the skin closed with 4-0 Vicryl. Dermabond was applied to the incisions. Next attention was turned to the area along the venous half of the graft which had bled. An ellipse of skin was made encompassing this wound. Hemostasis was attained in the wound. The skin was closed with a running 4-0 subcuticular stitch. Sterile dressing was applied. The patient tolerated the procedure well and was  transferred to recovery in stable condition. All needle and sponge counts were correct.  Waverly Ferrari, MD, FACS [facility]  DATE OF DICTATION:   07/02/2014

## 2014-07-02 NOTE — Progress Notes (Signed)
Per infectious disease this patient only needs to be on contact (orange) isolation for a positive MRSA swab (source: nares) from a culture done in July.  Will continue to monitor. Asher Muir-Valery Amedee,RN

## 2014-07-02 NOTE — Progress Notes (Signed)
Patient placed back on 28% ATC.  Currently tolerating well.  RT will continue to monitor.

## 2014-07-02 NOTE — Progress Notes (Signed)
Rt placed pt on VENT (CPAP/PS 5/10) FIO2 40% in PACU.

## 2014-07-02 NOTE — Progress Notes (Signed)
PULMONARY / CRITICAL CARE MEDICINE   Name: [name] MRN: [id number] DOB: [birth year]    ADMISSION DATE:  06/29/2014 CONSULTATION DATE:  06/29/2014  REFERRING MD :  [facility]  CHIEF COMPLAINT:  Cardiac arrest s/p right groin AV fistula/graft exanguination  INITIAL PRESENTATION: 68 y.o. F from [facility] transferred to [facility] ICU after she suffered a cardiac arrest when her right thigh AV fistula/graft bled out.  STUDIES:  8/1 CT Abd >>> no acute process, diverticulosis without acute diverticulitis, small b/l pleural effusions, multiple bilateral renal cysts. 8/6 CT Abd >>No acute retroperitoneal or intraperitoneal hemorrhage. Stable appearance of the abdomen and pelvis. Interval left femoral venous catheter placement.   SIGNIFICANT EVENTS: 8/1 seen in [facility] ED for abd pain 8/4 right groin AV fistula/graft bled out which led to cardiac arrest, transferred to [facility] ICU. 8/4 left HD cath placed 8/5 - HgB 5.4 transfused 2 units of blood 8/6 - HgB 6.7 transfused 2 units of blood, changed trach to XLT Shiley 4 with trach collar 8/7- placed on TCT   BRIEF HISTORY:  [name] is a 68 y.o. 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  On 8/4, her right thigh AV fistula/graft began bleeding profusely.  This unfortunately led to a cardiac arrest.  Pt was successfully resuscitated and was subsequently transferred to [facility] ICU for further evaluation/workup.  Of note, pt's daughter states that right thigh has bled several times over the past few months and in fact, pt was seen in [facility] by IR who dilated the graft recently.   SUBJECTIVE:  Awake and alert this morning, still somnolent, but following commands, no complaints of pain. Has some mild blood tinged sputum from trach.  Placed on TCT lastnight.  VITAL SIGNS: Temp:  [97.6 F (36.4 C)-98.7 F (37.1 C)] 97.6 F (36.4 C) (08/07 0411) Pulse Rate:  [57-148] 67 (08/07 0725) Resp:   [0-27] 19 (08/07 0725) BP: (99-197)/(66-108) 146/81 mmHg (08/07 0725) SpO2:  [87 %-100 %] 100 % (08/07 0725) FiO2 (%):  [28 %-40 %] 28 % (08/07 0725) Weight:  [185 lb 6.5 oz (84.1 kg)-189 lb 6 oz (85.9 kg)] 185 lb 6.5 oz (84.1 kg) (08/07 0500) HEMODYNAMICS:   VENTILATOR SETTINGS: Vent Mode:  [-] PRVC FiO2 (%):  [28 %-40 %] 28 % Set Rate:  [18 bmp] 18 bmp Vt Set:  [500 mL] 500 mL PEEP:  [5 cmH20] 5 cmH20 Plateau Pressure:  [13 cmH20-18 cmH20] 18 cmH20 INTAKE / OUTPUT:  Intake/Output Summary (Last 24 hours) at 07/02/14 0740 Last data filed at 07/02/14 0600  Gross per 24 hour  Intake   1813 ml  Output   1430 ml  Net    383 ml    PHYSICAL EXAMINATION: General: Obese female, resting in bed, in NAD. Neuro: Follows some commands, and converses. HEENT: [place]/AT. PERRL, sclerae anicteric.  Trach in place, C/D/I. Cardiovascular: RRR, no M/R/G.  Lungs: Respirations even and unlabored. Wheezing LUL, No W/R/R. On full vent support. Coughing frequently. Abdomen: BS x 4, soft, NT/ND.  PEG in place. Musculoskeletal: No gross deformities, no edema.  Skin: Intact, warm, no rashes.   LABS:  CBC  Recent Labs Lab 06/30/14 0500  07/01/14 0443 07/01/14 1430 07/02/14 0300  WBC 8.2  --  4.4  --  4.9  HGB 5.4*  < > 6.7* 9.4* 9.2*  HCT 16.4*  < > 20.0* 27.7* 27.3*  PLT 183  --  110* 102* 109*  < > =  values in this interval not displayed. Coag's  Recent Labs Lab 07/01/14 1430  APTT 38*  INR 1.18   BMET  Recent Labs Lab 06/30/14 0500 07/01/14 0443 07/02/14 0300  NA 134* 136* 137  K 5.3 3.7 3.5*  CL 92* 97 97  CO2 23 24 27   BUN 99* 51* 33*  CREATININE 6.24* 3.57* 2.58*  GLUCOSE 78 86 71   Electrolytes  Recent Labs Lab 06/29/14 1114 06/30/14 0500 07/01/14 0443 07/02/14 0300  CALCIUM 8.9 9.0 8.4 8.1*  MG 2.1 2.0  --   --   PHOS 4.3 3.9 3.4 3.1   Sepsis Markers  Recent Labs Lab 06/29/14 1300 06/29/14 1713 06/29/14 2300  LATICACIDVEN 7.1* 5.0* 3.5*   ABG No  results found for this basename: PHART, PCO2ART, PO2ART,  in the last 168 hours Liver Enzymes  Recent Labs Lab 06/26/14 2300 06/29/14 1114 07/01/14 0443 07/02/14 0300  AST 14 31  --   --   ALT 13 27  --   --   ALKPHOS 899* 779*  --   --   BILITOT 0.4 0.3  --   --   ALBUMIN 3.8 2.7* 2.4* 2.8*   Cardiac Enzymes  Recent Labs Lab 06/29/14 1113 06/29/14 1713 06/29/14 2300  TROPONINI <0.30 <0.30 <0.30   Glucose  Recent Labs Lab 07/01/14 1550 07/01/14 2009 07/01/14 2118 07/01/14 2344 07/02/14 0408 07/02/14 0613  GLUCAP 80 67* 90 97 68* 93    Imaging Ct Abdomen Pelvis Wo Contrast  07/01/2014   CLINICAL DATA:  Significant anemia and suspicion of acute retroperitoneal hemorrhage.  EXAM: CT ABDOMEN AND PELVIS WITHOUT CONTRAST  TECHNIQUE: Multidetector CT imaging of the abdomen and pelvis was performed following the standard protocol without IV contrast.  COMPARISON:  06/27/2014  FINDINGS: Lungs show bibasilar atelectasis, left greater than right. There is a trace amount of left pleural fluid.  Unenhanced appearance of the abdomen and pelvis shows no evidence of acute retroperitoneal or intraperitoneal hemorrhage. Unenhanced appearance of the liver, pancreas, spleen, kidneys and bowel are stable and without significant findings. There are stable bilateral hyperdense lesions of the kidneys presumably representing hyperdense cysts.  The kidneys are also very atrophic consistent with known end-stage renal disease.  Left femoral venous catheter present extending into the proximal left external iliac vein. Dialysis graft partially visualized in the right thigh. Rectal tube present.  No evidence of bowel obstruction, significant ileus or free intraperitoneal air. No hernias are identified. Stable atherosclerosis of the abdominal aorta without evidence of aneurysm. No mass lesions or enlarged lymph nodes. Stable densely calcified uterine fibroids.  IMPRESSION: No acute retroperitoneal or  intraperitoneal hemorrhage. Otherwise stable appearance of the abdomen and pelvis. Interval left femoral venous catheter placement.   Electronically Signed   By: Irish Lack M.D.   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  Low tidal volumes, low return volumes MRSA PNA Small effusions, PNA vs ESRD P:   Changed extra long trach for better ventilation: size 4 Shiley and trach collar Weaned to 35% Continue Vancomycin as per ID section. Duonebs for wheezing PRN  Consider PMV evaluation in 1-2 days if respiratory status remains stable.    CARDIOVASCULAR CVL left femoral 06/29/14 A:  S/p cardiac arrest - presumed in setting right thigh AV graft hemorrhage. ? Hx of V.Tach - latest progress note from [facility] states pt is on Amiodarone and Diltiazem, though, this not seen anywhere else in chart. ? Endocarditis - was being treated for presumed endocarditis at [facility] (last dose of Abx would have been on 8/12) P:  Goal MAP > 65. Monitor cortisol. Stress steroids (on Decadron as outpatient for respiratory failure for the pass 1 month).  Will need prolong steroid wean.  Hold outpatient Midodrine for now. Hold off on amiodarone/diltiazem for now.   RENAL A:  [facility] on dialysis T/Th/Sat - very limited access, right thigh AV fistula initially bleeding, now stable.  Bilateral IJ's have had trouble in past, left groin has femoral TLC now converted to HD catheter. Hypokalemia P:   Renal following - appreciated input.  Per Vascular - " Plan discussed with patient's daughter. Options of diatek  vs graft revision discussed. Plan will be to revise graft tomorrow if patient remains stable overnight"   GASTROINTESTINAL A:  Hep B surface antibody positive GI prophylaxis Nutrition P:   Pepcid. NPO after midnight per Vascular - surgery planned for tomorrow   HEMATOLOGIC A:  VTE prophylaxis Chronic anemia - low H\H today- transfused 2 units  P:  SCD's. Transfused 2unit of PRBC during HD ( total of 4 units) no 07/01/14  INFECTIOUS A:  Blood Cx 7/18 and 7/21 >>> MRSA  Bacteremia Diarrhea P:   C.diff PCR >>>negative, previously positive, and currently on vanc taper Abx: Vanc taper for chronic C.Diff   ENDOCRINE A:  DM Hypoglycemia ? AI - on decadron 4mg  BID as outpatient since 7/6  (for suspected chronic respiratory failure).  P:   D50 PRN CBG's q4hrs. SSI. Patient will require a slow taper (2-3 weeks) of steroids  Stress steroids,start wean 8/6, solucortef 50mg  IV q8 x 4 days, then reassess  NEUROLOGIC A:  TBI - s/p remote bilateral SDH's  Hx CVA Acute Encephalopathy- resolved Seizure disorder P:   Continue outpatient Keppra. Monitor.  May transfer back to [facility] over the weekend, if no complications from vascular surgery today.   Verdia KubaJennifer Beard PA-S     TODAY'S SUMMARY: 68 y.o. F transferred from [facility] due to cardiac arrest after her right thigh AV fistula bled out.  Vascular and Renal consulted.  Currently on vent and off Levophed.  She is on vanc for MRSA pneumonia and MSSA bacteremia and is C.Diff negative.  Now improving and alert, getting RLE graft revised by vascular on 07/02/14.    Critical Care time  = 35mins  Patient seen and examined with PA student Tiney RougeJ. Beard, agree with above assessment and plan, with the above exception as noted.   Stephanie AcreVishal Ophie Burrowes, MD [place] [facility] Pager [phone] On Call Pager [phone]

## 2014-07-02 NOTE — H&P (View-Only) (Signed)
[facility] [place] [facility]  Subjective  - more responsive and interactive   Objective 135/76 75 98.7 F (37.1 C) (Oral) 18 100%  Intake/Output Summary (Last 24 hours) at 07/01/14 0909 Last data filed at 07/01/14 0800  Gross per 24 hour  Intake 3065.18 ml  Output   1309 ml  Net 1756.18 ml   Right thigh graft occluded No bleeding  No evidence of infection  Assessment/Planning: Thrombosed right thigh graft after bleeding on Tuesday.  Now off Levophed.  More stable overall still anemic. Plan for thrombectomy and revision of right thigh graft by my partner Dr Edilia Boickson tomorrow if remains stable Hold tube feeds midnight Consent   Symphoni Helbling E 07/01/2014 9:09 AM -- Addendum: Plan discussed with patient's daughter.  Options of diatek vs graft revision discussed.  Plan will be to revise graft tomorrow.  Daughter realizes that her mother is ill and has limited non perfect options.  Risk of graft infection, thrombosis other periprocedural events discussed.  Fabienne Brunsharles Pluma Diniz, MD [facility] [place] Office: [phone] Pager: [phone]    Laboratory Lab Results:  Recent Labs  06/30/14 0500 06/30/14 1030 07/01/14 0443  WBC 8.2  --  4.4  HGB 5.4* 10.4* 6.7*  HCT 16.4* 31.1* 20.0*  PLT 183  --  110*   BMET  Recent Labs  06/30/14 0500 07/01/14 0443  NA 134* 136*  K 5.3 3.7  CL 92* 97  CO2 23 24  GLUCOSE 78 86  BUN 99* 51*  CREATININE 6.24* 3.57*  CALCIUM 9.0 8.4    COAG Lab Results  Component Value Date   INR 0.9 01/16/2008   INR 1.0 12/03/2007   INR 1.2 11/13/2007   No results found for this basename: PTT

## 2014-07-02 NOTE — Procedures (Signed)
I was present for procedure.  Stephanie AcreVishal Eimi Viney, MD [place] [facility] Pager [phone] On Call Pager [phone]

## 2014-07-02 NOTE — Progress Notes (Signed)
Hypoglycemic Event  CBG: 69  Treatment: D50 IV 25 mL  Symptoms: None  Follow-up CBG: Time:Off to OR CBG Result:  Possible Reasons for Event:Inadequate po intake  Comments/MD notified:Dr Era BumpersMungan    Oluwanifemi Susman, Asher MuirJamie R  Remember to initiate Hypoglycemia Order Set & complete

## 2014-07-02 NOTE — Transfer of Care (Signed)
Immediate Anesthesia Transfer of Care Note  Patient: [name]  Procedure(s) Performed: Procedure(s): THROMBECTOMY AND REVISION OF VENOUS SIDE OF ARTERIOVENTOUS (AV) GORETEX  GRAFT-RIGHT THIGH (Right)  Patient Location: PACU  Anesthesia Type:General  Level of Consciousness: unresponsive and Patient remains intubated per anesthesia plan  Airway & Oxygen Therapy: Patient placed on Ventilator (see vital sign flow sheet for setting) and to tracheostomy  Post-op Assessment: Report given to PACU RN and Post -op Vital signs reviewed and stable  Post vital signs: Reviewed  Complications: No apparent anesthesia complications

## 2014-07-03 DIAGNOSIS — I469 Cardiac arrest, cause unspecified: Secondary | ICD-10-CM | POA: Diagnosis not present

## 2014-07-03 DIAGNOSIS — T82598A Other mechanical complication of other cardiac and vascular devices and implants, initial encounter: Secondary | ICD-10-CM | POA: Diagnosis not present

## 2014-07-03 LAB — GLUCOSE, CAPILLARY
GLUCOSE-CAPILLARY: 119 mg/dL — AB (ref 70–99)
GLUCOSE-CAPILLARY: 101 mg/dL — AB (ref 70–99)
GLUCOSE-CAPILLARY: 116 mg/dL — AB (ref 70–99)
GLUCOSE-CAPILLARY: 111 mg/dL — AB (ref 70–99)
Glucose-Capillary: 112 mg/dL — ABNORMAL HIGH (ref 70–99)

## 2014-07-03 LAB — RENAL FUNCTION PANEL
ALBUMIN: 2.8 g/dL — AB (ref 3.5–5.2)
ANION GAP: 15 (ref 5–15)
BUN: 49 mg/dL — AB (ref 6–23)
CHLORIDE: 98 meq/L (ref 96–112)
CO2: 24 mEq/L (ref 19–32)
Calcium: 7.9 mg/dL — ABNORMAL LOW (ref 8.4–10.5)
Creatinine, Ser: 3.59 mg/dL — ABNORMAL HIGH (ref 0.50–1.10)
GFR calc Af Amer: 14 mL/min — ABNORMAL LOW (ref >90)
GFR, EST NON AFRICAN AMERICAN: 12 mL/min — AB (ref >90)
Glucose, Bld: 124 mg/dL — ABNORMAL HIGH (ref 70–99)
PHOSPHORUS: 4.1 mg/dL (ref 2.3–4.6)
POTASSIUM: 3.9 meq/L (ref 3.7–5.3)
Sodium: 137 mEq/L (ref 137–147)

## 2014-07-03 LAB — CBC
HEMATOCRIT: 28.4 % — AB (ref 36.0–46.0)
Hemoglobin: 9.3 g/dL — ABNORMAL LOW (ref 12.0–15.0)
MCH: 29.2 pg (ref 26.0–34.0)
MCHC: 32.7 g/dL (ref 30.0–36.0)
MCV: 89 fL (ref 78.0–100.0)
PLATELETS: 122 10*3/uL — AB (ref 150–400)
RBC: 3.19 MIL/uL — ABNORMAL LOW (ref 3.87–5.11)
RDW: 17.2 % — AB (ref 11.5–15.5)
WBC: 6 10*3/uL (ref 4.0–10.5)

## 2014-07-03 LAB — HEPATITIS B SURFACE ANTIGEN: HEP B S AG: NEGATIVE

## 2014-07-03 MED ORDER — DARBEPOETIN ALFA-POLYSORBATE 60 MCG/0.3ML IJ SOLN
60.0000 ug | INTRAMUSCULAR | Status: DC
  Administered 2014-07-03: 60 ug via INTRAVENOUS
  Filled 2014-07-03: qty 0.3

## 2014-07-03 NOTE — Progress Notes (Signed)
PULMONARY / CRITICAL CARE MEDICINE   Name: [name] MRN: [id number] DOB: Jan 16, 1946    ADMISSION DATE:  06/29/2014 CONSULTATION DATE:  06/29/2014  REFERRING MD :  [facility]  CHIEF COMPLAINT:  Cardiac arrest s/p right groin AV fistula/graft exanguination  INITIAL PRESENTATION: 68 y.o. F from [facility] transferred to [facility] ICU after she suffered a cardiac arrest when her right thigh AV fistula/graft bled out.  STUDIES:  8/1 CT Abd >>> no acute process, diverticulosis without acute diverticulitis, small b/l pleural effusions, multiple bilateral renal cysts. 8/6 CT Abd >>No acute retroperitoneal or intraperitoneal hemorrhage. Stable appearance of the abdomen and pelvis. Interval left femoral venous catheter placement.   SIGNIFICANT EVENTS: 8/1 seen in [facility] ED for abd pain 8/4 right groin AV fistula/graft bled out which led to cardiac arrest, transferred to [facility] ICU. 8/4 left HD cath placed 8/5 - HgB 5.4 transfused 2 units of blood 8/6 - HgB 6.7 transfused 2 units of blood, changed trach to XLT Shiley 4 with trach collar 8/7- placed on TCT 8/7 graft revision per [facility]. Right thigh.  SUBJECTIVE:  Sleeping  VITAL SIGNS: Temp:  [97.6 F (36.4 C)-98 F (36.7 C)] 97.9 F (36.6 C) (08/08 0814) Pulse Rate:  [52-98] 80 (08/08 0900) Resp:  [14-23] 20 (08/08 0900) BP: (109-163)/(59-82) 136/73 mmHg (08/08 0900) SpO2:  [96 %-100 %] 99 % (08/08 0900) FiO2 (%):  [28 %-40 %] 28 % (08/08 0800) Weight:  [188 lb 11.4 oz (85.6 kg)] 188 lb 11.4 oz (85.6 kg) (08/08 0500) VENTILATOR SETTINGS: Vent Mode:  [-] CPAP FiO2 (%):  [28 %-40 %] 28 % PEEP:  [5 cmH20] 5 cmH20 Pressure Support:  [10 cmH20] 10 cmH20 INTAKE / OUTPUT:  Intake/Output Summary (Last 24 hours) at 07/03/14 0926 Last data filed at 07/03/14 0900  Gross per 24 hour  Intake 2223.5 ml  Output    275 ml  Net 1948.5 ml    PHYSICAL EXAMINATION: General: Obese female, resting in bed, in NAD. Neuro: Arouses to physical stimulus  HEENT:  /AT. PERRL, sclerae anicteric.  Trach in place, C/D/I. Cardiovascular: RRR, no M/R/G.  Lungs: Respirations even and unlabored. On full vent support Abdomen: BS x 4, soft, NT/ND.  PEG in place. Musculoskeletal: No gross deformities, no edema.  Skin: Intact, warm, no rashes.   LABS:  CBC  Recent Labs Lab 07/01/14 0443 07/01/14 1430 07/02/14 0300 07/03/14 0400  WBC 4.4  --  4.9 6.0  HGB 6.7* 9.4* 9.2* 9.3*  HCT 20.0* 27.7* 27.3* 28.4*  PLT 110* 102* 109* 122*   Coag's  Recent Labs Lab 07/01/14 1430  APTT 38*  INR 1.18   BMET  Recent Labs Lab 07/01/14 0443 07/02/14 0300 07/03/14 0400  NA 136* 137 137  K 3.7 3.5* 3.9  CL 97 97 98  CO2 24 27 24   BUN 51* 33* 49*  CREATININE 3.57* 2.58* 3.59*  GLUCOSE 86 71 124*   Electrolytes  Recent Labs Lab 06/29/14 1114 06/30/14 0500 07/01/14 0443 07/02/14 0300 07/03/14 0400  CALCIUM 8.9 9.0 8.4 8.1* 7.9*  MG 2.1 2.0  --   --   --   PHOS 4.3 3.9 3.4 3.1 4.1   Sepsis Markers  Recent Labs Lab 06/29/14 1300 06/29/14 1713 06/29/14 2300  LATICACIDVEN 7.1* 5.0* 3.5*   Liver Enzymes  Recent Labs Lab 06/26/14 2300 06/29/14 1114 07/01/14 0443 07/02/14 0300 07/03/14 0400  AST 14 31  --   --   --   ALT 13 27  --   --   --  ALKPHOS 899* 779*  --   --   --   BILITOT 0.4 0.3  --   --   --   ALBUMIN 3.8 2.7* 2.4* 2.8* 2.8*   Cardiac Enzymes  Recent Labs Lab 06/29/14 1113 06/29/14 1713 06/29/14 2300  TROPONINI <0.30 <0.30 <0.30   Glucose  Recent Labs Lab 07/02/14 1459 07/02/14 1557 07/02/14 1947 07/02/14 2339 07/03/14 0349 07/03/14 0804  GLUCAP 94 85 93 95 119* 112*    Imaging Dg Chest Port 1 View  07/02/2014   CLINICAL DATA:  Effusion.  Tracheotomy.  EXAM: PORTABLE CHEST - 1 VIEW  COMPARISON:  06/30/2014  FINDINGS: Tracheostomy tube remains seated. Unchanged cardiomegaly and upper mediastinal widening given differences in technique.  Lower lung volumes. Hazy basilar densities, left more than  right, atelectasis and small volume effusion based on CT from yesterday. No pneumothorax.  IMPRESSION: 1. Lower lung volumes. 2. Unchanged basilar opacities, atelectasis and small effusions by recent CT.   Electronically Signed   By: Tiburcio PeaJonathan  Watts M.D.   On: 07/02/2014 06:25   Antibiotic History From [facility] (no IV access) Linezolid 7/14>>7/24, MRSA PNA Levaquin 7/14>>7/22, PNA Vanc 7/22>>  Cultures from [facility] Resp Cx 7/12 >> MRSA, Sensitive vanc and linezolid Bld Cx 7/15, 2/2 (+) MSSA, oxacillin sensitive Blx Cx 7/18, 1/2 (+) MSSA Bld Cx 7/20, no growth C.Diff stool (+) 7/14,  Negative 8/4. 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  Low tidal volumes, low return volumes MRSA PNA Small effusions, PNA vs ESRD P:   Changed extra long trach for better ventilation: size 4 Shiley and trach collar Weaned to 35% Continue Vancomycin as per ID section. Duonebs for wheezing PRN Consider PMV evaluation in 1-2 days if respiratory status remains stable.    CARDIOVASCULAR CVL left femoral 06/29/14 A:  S/p cardiac arrest - presumed in setting right thigh AV graft hemorrhage. ? Hx of V.Tach - latest progress note from [facility] states pt is on Amiodarone and Diltiazem, though, this not seen anywhere else in chart. ? Endocarditis - was being treated for presumed endocarditis at [facility] (last dose of Abx would have been on 8/12) P:  Goal MAP > 65. Monitor cortisol. Stress steroids (on Decadron as outpatient for respiratory failure for the pass 1 month).  Will need prolong steroid wean.  Hold outpatient Midodrine for now. Hold off on amiodarone/diltiazem for now.   RENAL A:  [facility] on dialysis T/Th/Sat - very limited access, right thigh AV fistula initially bleeding, now stable.  Bilateral IJ's have had trouble in past, left groin has femoral TLC now converted to HD catheter. Hypokalemia P:   Renal following -  appreciated input.  Per Vascular - " Plan discussed with patient's daughter. Options of diatek vs graft revision discussed. Graft revised 8/7  GASTROINTESTINAL A:  Hep B surface antibody positive GI prophylaxis Nutrition P:   Pepcid. TF   HEMATOLOGIC A:  VTE prophylaxis Chronic anemia - low H\H today- transfused 2 units  P:  SCD's. Transfused 2unit of PRBC during HD ( total of 4 units) no 07/01/14  INFECTIOUS A:  Blood Cx 7/18 and 7/21 >>> MRSA  Bacteremia Diarrhea P:   C.diff PCR >>>negative, previously positive, and currently on vanc taper Abx: Vanc taper for chronic C.Diff(note 8/4 neg c dif) Keflex per tube 8/7 per VVS>>  ENDOCRINE A:  DM Hypoglycemia ? AI - on decadron 4mg  BID as outpatient since 7/6  (for  suspected chronic respiratory failure).  P:   D50 PRN CBG's q4hrs. SSI. Patient will require a slow taper (2-3 weeks) of steroids  Stress steroids,start wean 8/6, solucortef 50mg  IV q8 x 4 days, then reassess  NEUROLOGIC A:  TBI - s/p remote bilateral SDH's  Hx CVA Acute Encephalopathy- resolved Seizure disorder P:   Continue outpatient Keppra. Monitor.  May transfer back to [facility] over the weekend, if no complications from vascular surgery 8/7.    TODAY'S SUMMARY: 68 y.o. F transferred from [facility] due to cardiac arrest after her right thigh AV fistula bled out.  Vascular and Renal consulted.  Currently on vent and off Levophed.  She is on vanc for MRSA pneumonia and MSSA bacteremia and is C.Diff negative.  Now improving and alert,  R thigh graft revised by vascular on 07/02/14.    Brett Canales Minor ACNP [facility] Pager [phone] till 3 pm If no answer page [phone] 07/03/2014, 9:35 AM    Reviewed above, and agree.  Coralyn Helling, MD [facility] Pulmonary/Critical Care 07/03/2014, 1:11 PM Pager:  [phone] After 3pm call: [phone]

## 2014-07-03 NOTE — Progress Notes (Signed)
Patient ID: Christina HeadySandra A [name], female   DOB: 01-May-1946, 68 y.o.   MRN: [id number] Patient had replacement of venous half of right thigh AV graft by Dr. Durwin Noraixon yesterday Also had a short incision at the 9:00 position over arterial half of loop were previous intervention had been performed  It is okay to use right thigh AV graft on the arterial half of the leg with the exception of one area at the 9:00 position where there is a short incision We will leave sketch on the chart to help clarify this for hemodialysis Otherwise will see patient on when necessary basis

## 2014-07-03 NOTE — Progress Notes (Signed)
S:trached O:BP 124/68  Pulse 77  Temp(Src) 97.6 F (36.4 C) (Oral)  Resp 19  Ht 5\' 5"  (1.651 m)  Wt 85.6 kg (188 lb 11.4 oz)  BMI 31.40 kg/m2  SpO2 99%  Intake/Output Summary (Last 24 hours) at 07/03/14 0802 Last data filed at 07/03/14 0600  Gross per 24 hour  Intake 2118.5 ml  Output    275 ml  Net 1843.5 ml   Weight change: 1.1 kg (2 lb 6.8 oz) Gen: Awake and alert CVS:RRR Resp:clear  Abd:+ BS NTND Ext:No edema   Rt thigh AVG + bruit NEURO: follows commands.  Mouths words Lt fem HD cath  . antiseptic oral rinse  7 mL Mouth Rinse QID  . cephALEXin  500 mg Per Tube BID  . chlorhexidine  15 mL Mouth Rinse BID  . [START ON 07/05/2014] darbepoetin  60 mcg Subcutaneous Q7 days  . dexamethasone  4 mg Oral Daily  . famotidine  20 mg Per Tube Daily  . feeding supplement (PRO-STAT SUGAR FREE 64)  30 mL Per Tube TID  . feeding supplement (VITAL HIGH PROTEIN)  1,000 mL Per Tube Q24H  . hydrocortisone sod succinate (SOLU-CORTEF) inj  50 mg Intravenous Q8H   Followed by  . [START ON 07/05/2014] hydrocortisone sod succinate (SOLU-CORTEF) inj  50 mg Intravenous Q12H   Followed by  . [START ON 07/09/2014] hydrocortisone sod succinate (SOLU-CORTEF) inj  25 mg Intravenous Q12H   Followed by  . [START ON 07/13/2014] hydrocortisone sod succinate (SOLU-CORTEF) inj  25 mg Intravenous Daily  . levETIRAcetam  250 mg Per Tube BID  . modafinil  200 mg Oral Daily  . vancomycin  125 mg Oral QID   Followed by  . [START ON 07/06/2014] vancomycin  125 mg Oral BID   Followed by  . [START ON 07/14/2014] vancomycin  125 mg Oral Daily   Followed by  . [START ON 07/21/2014] vancomycin  125 mg Oral QODAY   Followed by  . [START ON 07/29/2014] vancomycin  125 mg Oral Q3 days  . vitamin A & D  1 application Topical Q12H   Dg Chest Port 1 View  07/02/2014   CLINICAL DATA:  Effusion.  Tracheotomy.  EXAM: PORTABLE CHEST - 1 VIEW  COMPARISON:  06/30/2014  FINDINGS: Tracheostomy tube remains seated. Unchanged  cardiomegaly and upper mediastinal widening given differences in technique.  Lower lung volumes. Hazy basilar densities, left more than right, atelectasis and small volume effusion based on CT from yesterday. No pneumothorax.  IMPRESSION: 1. Lower lung volumes. 2. Unchanged basilar opacities, atelectasis and small effusions by recent CT.   Electronically Signed   By: Tiburcio Pea M.D.   On: 07/02/2014 06:25   BMET    Component Value Date/Time   NA 137 07/03/2014 0400   K 3.9 07/03/2014 0400   CL 98 07/03/2014 0400   CO2 24 07/03/2014 0400   GLUCOSE 124* 07/03/2014 0400   BUN 49* 07/03/2014 0400   CREATININE 3.59* 07/03/2014 0400   CALCIUM 7.9* 07/03/2014 0400   GFRNONAA 12* 07/03/2014 0400   GFRAA 14* 07/03/2014 0400   CBC    Component Value Date/Time   WBC 6.0 07/03/2014 0400   RBC 3.19* 07/03/2014 0400   HGB 9.3* 07/03/2014 0400   HCT 28.4* 07/03/2014 0400   PLT 122* 07/03/2014 0400   MCV 89.0 07/03/2014 0400   MCH 29.2 07/03/2014 0400   MCHC 32.7 07/03/2014 0400   RDW 17.2* 07/03/2014 0400   LYMPHSABS 0.5* 07/01/2014  0443   MONOABS 0.3 07/01/2014 0443   EOSABS 0.0 07/01/2014 0443   BASOSABS 0.0 07/01/2014 0443     Assessment: 1. Acute blood loss anemia from bleeding AVG.  Hg stable 2. VDRF 3. ESRD 4. Hypotension, resolved 5. SP declot and revision AVG ( Venous half replaced) Plan: 1.  HD today 2.  Need [facility]VS to address issue of whether we can use AVG or not.  Since permcath not placed, I suspect we can in limited areas.     Danyon Mcginness T

## 2014-07-03 NOTE — Progress Notes (Signed)
Report taken from RenickLinda, [place].  Assuming care of pt at this time.

## 2014-07-03 NOTE — Progress Notes (Signed)
ANTIBIOTIC CONSULT NOTE - FOLLOW UP  Pharmacy Consult for Vancomycin Indication: MRSA PNA, MSSA bacteremia, presumed IE  Allergies  Allergen Reactions  . Penicillins Hives and Rash    Patient Measurements: Height:  (85kg) Weight: 188 lb 11.4 oz (85.6 kg) IBW/kg (Calculated) : 57  Vital Signs: Temp: 97.9 F (36.6 C) (08/08 0814) Temp src: Oral (08/08 0814) BP: 126/75 mmHg (08/08 1000) Pulse Rate: 72 (08/08 1000) Intake/Output from previous day: 08/07 0701 - 08/08 0700 In: 2153.5 [I.V.:636.5; NG/GT:1427] Out: 275 [Stool:225; Blood:50] Intake/Output from this shift: Total I/O In: 105 [NG/GT:105] Out: -   Labs:  Recent Labs  07/01/14 0443 07/01/14 1430 07/02/14 0300 07/03/14 0400  WBC 4.4  --  4.9 6.0  HGB 6.7* 9.4* 9.2* 9.3*  PLT 110* 102* 109* 122*  CREATININE 3.57*  --  2.58* 3.59*   Estimated Creatinine Clearance: 16.2 ml/min (by C-G formula based on Cr of 3.59).  Recent Labs  07/01/14 0900  VANCORANDOM 23.8     Microbiology: Recent Results (from the past 720 hour(s))  URINE CULTURE     Status: None   Collection Time    06/26/14 11:35 PM      Result Value Ref Range Status   Specimen Description URINE, CATHETERIZED   Final   Special Requests CX ADDED AT 0143 ON [id number]   Final   Culture  Setup Time     Final   Value: 06/27/2014 02:41     [facility]formed at [facility]   Colony Count     Final   Value: NO GROWTH     [facility]formed at [facility]   Culture     Final   Value: NO GROWTH     Performed at [facility]   Report Status 06/28/2014 FINAL   Final  MRSA PCR SCREENING     Status: None   Collection Time    06/29/14 11:05 AM      Result Value Ref Range Status   MRSA by PCR NEGATIVE  NEGATIVE Final   Comment:            The GeneXpert MRSA Assay (FDA     approved for NASAL specimens     only), is one component of a     comprehensive MRSA colonization     surveillance program. It is not     intended to diagnose MRSA   infection nor to guide or     monitor treatment for     MRSA infections.  CLOSTRIDIUM DIFFICILE BY PCR     Status: None   Collection Time    06/29/14  1:19 PM      Result Value Ref Range Status   C difficile by pcr NEGATIVE  NEGATIVE Final    Anti-infectives   Start     Dose/Rate Route Frequency Ordered Stop   07/29/14 1000  vancomycin (VANCOCIN) 50 mg/mL oral solution 125 mg     125 mg Oral Every 3 DAYS 06/29/14 1200 08/13/14 0959   07/21/14 1000  vancomycin (VANCOCIN) 50 mg/mL oral solution 125 mg     125 mg Oral Every other day 06/29/14 1200 07/29/14 0959   07/14/14 1000  vancomycin (VANCOCIN) 50 mg/mL oral solution 125 mg     125 mg Oral Daily 06/29/14 1200 07/21/14 0959   07/06/14 2200  vancomycin (VANCOCIN) 50 mg/mL oral solution 125 mg     125 mg Oral 2 times daily 06/29/14 1200 07/13/14 2159   07/02/14 1800  cephALEXin (  KEFLEX) 250 MG/5ML suspension 500 mg     500 mg Per Tube 2 times daily 07/02/14 1612     07/01/14 1049  vancomycin (VANCOCIN) 500 mg in sodium chloride 0.9 % 100 mL IVPB     500 mg 100 mL/hr over 60 Minutes Intravenous Every Dialysis 07/01/14 1050 07/01/14 1240   07/01/14 1044  vancomycin (VANCOCIN) 500 mg in sodium chloride 0.9 % 100 mL IVPB  Status:  Discontinued     500 mg 100 mL/hr over 60 Minutes Intravenous Every Dialysis 07/01/14 1044 07/01/14 1050   06/30/14 1500  vancomycin (VANCOCIN) IVPB 1000 mg/200 mL premix  Status:  Discontinued     1,000 mg 200 mL/hr over 60 Minutes Intravenous Every Dialysis 06/30/14 0836 06/30/14 0841   06/30/14 0840  vancomycin (VANCOCIN) IVPB 1000 mg/200 mL premix  Status:  Discontinued     1,000 mg 200 mL/hr over 60 Minutes Intravenous Every Dialysis 06/30/14 0841 07/01/14 1049   06/29/14 1400  vancomycin (VANCOCIN) 50 mg/mL oral solution 125 mg     125 mg Oral 4 times daily 06/29/14 1200 07/06/14 1459      Assessment: 68 YOF continued on IV vancomycin for MRSA pneumonia, MSSA bacteremia, and 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 Plan per ID was for 4 weeks of vancomycin with planned stop date of 07/07/14.   The patient is noted to be ESRD - normal HD on T/Th/Sat. The patient underwent an AVG revision on 8/7 and are planning for dialysis today on schedule.   A random Vancomycin level on 8/6 was at goal at 23.8 mcg/ml - the patient then underwent dialysis and only tolerated 3.5 hr at a lower BFR of 250. A lower maintenance dose was given for repletion. During the AVG revision on 8/7 it is noted that the patient received a dose of Vancomycin 500 mg intra-op (not on MAR but noted in anesthesia surgery records). Given the extra dose on 8/7 - will plan to hold  Vancomycin after today's dialysis session.   Abx hx (Confirmed w/ [facility]) Vanc 7/22 >> (8/12) VR 8/5-10.4; Pre-HD VL 8/6 -23.8 Pre-HD VL 8/5 = 10.4 Linezolid 7/14 >>7/24 Levaquin 7/14 >>7/22 Vanc po 125 q6h 7/14 >>8/2, then 500 q6h 8/3 >> taper per CCM (hx cdiff on 9-09/2013, 3-02/2014)   Cx data from [facility]: (called lab) 06/06/14 Respiratory culture >> MRSA 06/09/14 Bld 2/2 >> MSSA 06/12/14 Bld 1/2 >> MSSA  06/14/14 Bld >> NEG 06/08/14 Cdiff +?  [facility] cx: 8/4 Cdiff NEG 8/4 MRSA PCR NEG  Goal of Therapy:  Pre-HD level of 15-25 mcg/ml  Plan:  1. Hold Vancomycin dose post-HD today 2. Will plan to resume Vancomycin maintenance doses after the patient's next scheduled dialysis session 3. Will continue to follow HD schedule/duration, culture results, and appropriate LOT  Georgina Pillion, PharmD, BCPS Clinical Pharmacist Pager: [phone] 07/03/2014 11:10 AM

## 2014-07-04 DIAGNOSIS — T82598A Other mechanical complication of other cardiac and vascular devices and implants, initial encounter: Secondary | ICD-10-CM | POA: Diagnosis not present

## 2014-07-04 DIAGNOSIS — I469 Cardiac arrest, cause unspecified: Secondary | ICD-10-CM | POA: Diagnosis not present

## 2014-07-04 LAB — BASIC METABOLIC PANEL
Anion gap: 12 (ref 5–15)
BUN: 30 mg/dL — ABNORMAL HIGH (ref 6–23)
CALCIUM: 8.7 mg/dL (ref 8.4–10.5)
CO2: 28 mEq/L (ref 19–32)
Chloride: 102 mEq/L (ref 96–112)
Creatinine, Ser: 2.4 mg/dL — ABNORMAL HIGH (ref 0.50–1.10)
GFR, EST AFRICAN AMERICAN: 23 mL/min — AB (ref >90)
GFR, EST NON AFRICAN AMERICAN: 20 mL/min — AB (ref >90)
Glucose, Bld: 89 mg/dL (ref 70–99)
POTASSIUM: 3.8 meq/L (ref 3.7–5.3)
SODIUM: 142 meq/L (ref 137–147)

## 2014-07-04 LAB — GLUCOSE, CAPILLARY
GLUCOSE-CAPILLARY: 98 mg/dL (ref 70–99)
Glucose-Capillary: 100 mg/dL — ABNORMAL HIGH (ref 70–99)
Glucose-Capillary: 102 mg/dL — ABNORMAL HIGH (ref 70–99)
Glucose-Capillary: 106 mg/dL — ABNORMAL HIGH (ref 70–99)

## 2014-07-04 LAB — CBC
HCT: 27.8 % — ABNORMAL LOW (ref 36.0–46.0)
Hemoglobin: 9 g/dL — ABNORMAL LOW (ref 12.0–15.0)
MCH: 29.6 pg (ref 26.0–34.0)
MCHC: 32.4 g/dL (ref 30.0–36.0)
MCV: 91.4 fL (ref 78.0–100.0)
Platelets: 125 10*3/uL — ABNORMAL LOW (ref 150–400)
RBC: 3.04 MIL/uL — ABNORMAL LOW (ref 3.87–5.11)
RDW: 17.4 % — ABNORMAL HIGH (ref 11.5–15.5)
WBC: 4.8 10*3/uL (ref 4.0–10.5)

## 2014-07-04 MED ORDER — VANCOMYCIN 50 MG/ML ORAL SOLUTION: 125.0000 mg | Freq: Four times a day (QID) | ORAL | Status: DC

## 2014-07-04 MED ORDER — VANCOMYCIN 50 MG/ML ORAL SOLUTION: 125.0000 mg | Freq: Two times a day (BID) | ORAL | Status: DC

## 2014-07-04 MED ORDER — VANCOMYCIN HCL IN DEXTROSE 1-5 GM/200ML-% IV SOLN: 1000.0000 mg | INTRAVENOUS | Status: DC

## 2014-07-04 MED ORDER — VANCOMYCIN 50 MG/ML ORAL SOLUTION: 125.0000 mg | Freq: Every day | ORAL | Status: DC

## 2014-07-04 NOTE — Progress Notes (Signed)
ANTIBIOTIC CONSULT NOTE - FOLLOW UP  Pharmacy Consult for Vancomycin Indication: MRSA PNA, MSSA bacteremia, presumed IE  Allergies  Allergen Reactions  . Penicillins Hives and Rash    Patient Measurements: Height:  (85kg) Weight: 181 lb (82.1 kg) IBW/kg (Calculated) : 57  Vital Signs: Temp: 98.5 F (36.9 C) (08/09 0805) Temp src: Oral (08/09 0805) BP: 173/87 mmHg (08/09 1000) Pulse Rate: 81 (08/09 1000) Intake/Output from previous day: 08/08 0701 - 08/09 0700 In: 1000 [NG/GT:840] Out: 2350 [Stool:350] Intake/Output from this shift: Total I/O In: 105 [NG/GT:105] Out: -   Labs:  Recent Labs  07/02/14 0300 07/03/14 0400 07/04/14 0437  WBC 4.9 6.0 4.8  HGB 9.2* 9.3* 9.0*  PLT 109* 122* 125*  CREATININE 2.58* 3.59* 2.40*   Estimated Creatinine Clearance: 23.7 ml/min (by C-G formula based on Cr of 2.4). No results found for this basename: Rolm Gala, VANCORANDOM, GENTTROUGH, GENTPEAK, GENTRANDOM, TOBRATROUGH, TOBRAPEAK, TOBRARND, AMIKACINPEAK, AMIKACINTROU, AMIKACIN,  in the last 72 hours   Microbiology: Recent Results (from the past 720 hour(s))  URINE CULTURE     Status: None   Collection Time    06/26/14 11:35 PM      Result Value Ref Range Status   Specimen Description URINE, CATHETERIZED   Final   Special Requests CX ADDED AT 0143 ON [id number]   Final   Culture  Setup Time     Final   Value: 06/27/2014 02:41     Performed at [facility]   Colony Count     Final   Value: NO GROWTH     Performed at [facility]   Culture     Final   Value: NO GROWTH     Performed at [facility]   Report Status 06/28/2014 FINAL   Final  MRSA PCR SCREENING     Status: None   Collection Time    06/29/14 11:05 AM      Result Value Ref Range Status   MRSA by PCR NEGATIVE  NEGATIVE Final   Comment:            The GeneXpert MRSA Assay (FDA     approved for NASAL specimens     only), is one component of a     comprehensive MRSA  colonization     surveillance program. It is not     intended to diagnose MRSA     infection nor to guide or     monitor treatment for     MRSA infections.  CLOSTRIDIUM DIFFICILE BY PCR     Status: None   Collection Time    06/29/14  1:19 PM      Result Value Ref Range Status   C difficile by pcr NEGATIVE  NEGATIVE Final    Anti-infectives   Start     Dose/Rate Route Frequency Ordered Stop   07/29/14 1000  vancomycin (VANCOCIN) 50 mg/mL oral solution 125 mg     125 mg Oral Every 3 DAYS 06/29/14 1200 08/13/14 0959   07/21/14 1000  vancomycin (VANCOCIN) 50 mg/mL oral solution 125 mg     125 mg Oral Every other day 06/29/14 1200 07/29/14 0959   07/14/14 1000  vancomycin (VANCOCIN) 50 mg/mL oral solution 125 mg     125 mg Oral Daily 06/29/14 1200 07/21/14 0959   07/06/14 2200  vancomycin (VANCOCIN) 50 mg/mL oral solution 125 mg     125 mg Oral 2 times daily 06/29/14 1200 07/13/14 2159   07/02/14 1800  cephALEXin (KEFLEX) 250 MG/5ML suspension 500 mg  Status:  Discontinued     500 mg Per Tube 2 times daily 07/02/14 1612 07/04/14 1032   07/01/14 1049  vancomycin (VANCOCIN) 500 mg in sodium chloride 0.9 % 100 mL IVPB     500 mg 100 mL/hr over 60 Minutes Intravenous Every Dialysis 07/01/14 1050 07/01/14 1240   07/01/14 1044  vancomycin (VANCOCIN) 500 mg in sodium chloride 0.9 % 100 mL IVPB  Status:  Discontinued     500 mg 100 mL/hr over 60 Minutes Intravenous Every Dialysis 07/01/14 1044 07/01/14 1050   06/30/14 1500  vancomycin (VANCOCIN) IVPB 1000 mg/200 mL premix  Status:  Discontinued     1,000 mg 200 mL/hr over 60 Minutes Intravenous Every Dialysis 06/30/14 0836 06/30/14 0841   06/30/14 0840  vancomycin (VANCOCIN) IVPB 1000 mg/200 mL premix  Status:  Discontinued     1,000 mg 200 mL/hr over 60 Minutes Intravenous Every Dialysis 06/30/14 0841 07/01/14 1049   06/29/14 1400  vancomycin (VANCOCIN) 50 mg/mL oral solution 125 mg     125 mg Oral 4 times daily 06/29/14 1200 07/06/14 1459       Assessment: 68 YOF continued on IV vancomycin for MRSA pneumonia, MSSA bacteremia, and 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 Plan per ID was for 4 weeks of vancomycin with planned stop date of 07/07/14.   The patient is noted to be ESRD - normal HD on T/Th/Sat. The patient underwent an AVG revision on 8/7 and received an extra dose of Vancomycin 500 mg intra-op (not on MAR but noted in anesthesia surgery records) - bringing the estimated level to above goal range (~31.3 mcg/ml). The patient then received dialysis on schedule on 8/8 and tolerated 3.5 hr at BFR 400. It is estimated that the patient's Vancomycin level is now back within goal range (~17.5 mcg/ml). Will begin scheduled maintenance doses with the next dialysis session.   Abx hx (Confirmed w/ [facility]) Vanc 7/22 >> 8/12 * 8/5 VR: 10.4  * 8/6 VR (pre-HD): 23.8  * HD 8/6 (3.5 hr, BFR 250), 8/8 (3.5 hr BFR 400) * Vanc doses 8/6 (500 mg - lower dose d/t HD tolerance), 8/7 (500 mg-OR) Linezolid 7/14 >>7/24 LVQ 7/14 >>7/22 Vanc po 7/14 >> taper per CCM Keflex 8/7 >> 8/9 (per VVS - d/ced since on Vanc)  Cx [facility]: (called lab) 7/12 RCx >> MRSA 7/15 BCx 2/2 >> MSSA 7/18 BCx 1/2 >> MSSA  7/20 BCx >> NG 7/14 Cdiff +  [facility] cx: 8/4 Cdiff NEG 8/4 MRSA PCR NEG  Goal of Therapy:  Pre-HD level of 15-25 mcg/ml  Plan:  1. Resume Vancomycin 1g post HD-T/Th/Sat (next dose on 8/11) 2. Will plan to make adjustments if the patient is dialyzed off-schedule 2. Will continue to follow HD schedule/duration, culture results, and appropriate LOT  Georgina PillionElizabeth Jamoni Broadfoot, PharmD, BCPS Clinical Pharmacist Pager: [phone] 07/04/2014 10:37 AM

## 2014-07-04 NOTE — Progress Notes (Addendum)
S:trached O:BP 144/89  Pulse 80  Temp(Src) 98.6 F (37 C) (Oral)  Resp 25  Ht 5\' 5"  (1.651 m)  Wt 82.1 kg (181 lb)  BMI 30.12 kg/m2  SpO2 100%  Intake/Output Summary (Last 24 hours) at 07/04/14 0723 Last data filed at 07/04/14 0700  Gross per 24 hour  Intake   1000 ml  Output   2350 ml  Net  -1350 ml   Weight change: 3.1 kg (6 lb 13.4 oz) Gen: Awake and alert CVS:RRR Resp:clear  Abd:+ BS NTND Ext:No edema   Rt thigh AVG + bruit NEURO: follows commands.  Mouths words Lt fem HD cath  . antiseptic oral rinse  7 mL Mouth Rinse QID  . cephALEXin  500 mg Per Tube BID  . chlorhexidine  15 mL Mouth Rinse BID  . darbepoetin  60 mcg Intravenous Q Sat-HD  . dexamethasone  4 mg Oral Daily  . famotidine  20 mg Per Tube Daily  . feeding supplement (PRO-STAT SUGAR FREE 64)  30 mL Per Tube TID  . feeding supplement (VITAL HIGH PROTEIN)  1,000 mL Per Tube Q24H  . hydrocortisone sod succinate (SOLU-CORTEF) inj  50 mg Intravenous Q8H   Followed by  . [START ON 07/05/2014] hydrocortisone sod succinate (SOLU-CORTEF) inj  50 mg Intravenous Q12H   Followed by  . [START ON 07/09/2014] hydrocortisone sod succinate (SOLU-CORTEF) inj  25 mg Intravenous Q12H   Followed by  . [START ON 07/13/2014] hydrocortisone sod succinate (SOLU-CORTEF) inj  25 mg Intravenous Daily  . levETIRAcetam  250 mg Per Tube BID  . modafinil  200 mg Oral Daily  . vancomycin  125 mg Oral QID   Followed by  . [START ON 07/06/2014] vancomycin  125 mg Oral BID   Followed by  . [START ON 07/14/2014] vancomycin  125 mg Oral Daily   Followed by  . [START ON 07/21/2014] vancomycin  125 mg Oral QODAY   Followed by  . [START ON 07/29/2014] vancomycin  125 mg Oral Q3 days  . vitamin A & D  1 application Topical Q12H   No results found. BMET    Component Value Date/Time   NA 142 07/04/2014 0437   K 3.8 07/04/2014 0437   CL 102 07/04/2014 0437   CO2 28 07/04/2014 0437   GLUCOSE 89 07/04/2014 0437   BUN 30* 07/04/2014 0437   CREATININE  2.40* 07/04/2014 0437   CALCIUM 8.7 07/04/2014 0437   GFRNONAA 20* 07/04/2014 0437   GFRAA 23* 07/04/2014 0437   CBC    Component Value Date/Time   WBC 4.8 07/04/2014 0437   RBC 3.04* 07/04/2014 0437   HGB 9.0* 07/04/2014 0437   HCT 27.8* 07/04/2014 0437   PLT 125* 07/04/2014 0437   MCV 91.4 07/04/2014 0437   MCH 29.6 07/04/2014 0437   MCHC 32.4 07/04/2014 0437   RDW 17.4* 07/04/2014 0437   LYMPHSABS 0.5* 07/01/2014 0443   MONOABS 0.3 07/01/2014 0443   EOSABS 0.0 07/01/2014 0443   BASOSABS 0.0 07/01/2014 0443     Assessment: 1. Acute blood loss anemia from bleeding AVG.  Hg stable 2. VDRF 3. ESRD 4. Hypotension, resolved 5. SP declot and revision AVG ( Venous half replaced) Plan: 1.  There is a drawing in the chart for sticking the AVG which [facility] MUST be aware of 2.  [facility]     Wassim Kirksey T

## 2014-07-04 NOTE — Discharge Summary (Signed)
Physician Discharge Summary  Patient ID: [name] MRN: [id number] DOB/AGE: Mar 01, 1946 68 y.o.  Admit date: 06/29/2014 Discharge date: 07/04/2014  Problem List Active Problems:   Hemorrhagic shock   Acute on chronic respiratory failure with hypoxemia   ESRD on dialysis   Tracheostomy status   DM (diabetes mellitus) type II controlled with renal manifestation   MRSA pneumonia   MRSA bacteremia   C. difficile colitis NOTE: She has a diagram of her right femoral av graft from Vascular Surgeon on WHERE AV graft can be CANNULATED. Please review diagram prior to any cannulation of AV graft.  HPI: [name] is a 68 y.o. 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 On 8/4, her right thigh AV fistula/graft began bleeding profusely. This unfortunately led to a cardiac arrest. Pt was successfully resuscitated and was subsequently transferred to [facility] ICU for further evaluation/workup. Of note, pt's daughter states that right thigh has bled several times over the past few months and in fact, pt was seen in [facility] by IR who dilated the graft recently.  Hospital Course: INITIAL PRESENTATION: 68 y.o. F from [facility] transferred to [facility] ICU after she suffered a cardiac arrest when her right thigh AV fistula/graft bled out.  STUDIES:  8/1 CT Abd >>> no acute process, diverticulosis without acute diverticulitis, small b/l pleural effusions, multiple bilateral renal cysts.  8/6 CT Abd >>No acute retroperitoneal or intraperitoneal hemorrhage. Stable appearance of the abdomen and pelvis. Interval left femoral venous catheter placement.   SIGNIFICANT EVENTS:  8/1 seen in [facility] ED for abd pain  8/4 right groin AV fistula/graft bled out which led to cardiac arrest, transferred to [facility] ICU.  8/4 left HD cath placed >>removed 8/9 8/5 - HgB 5.4 transfused 2 units of blood  8/6 - HgB 6.7 transfused 2 units of blood, changed trach to XLT Shiley 4  with trach collar  8/7- placed on TCT  8/7 graft revision per VVS. Right thigh.  8/9 graft usable but there is a diagram on how to cannulate it.  8/9 Transfer back to [facility]. NOTE: She has a diagram of her right femoral av graft from Vascular Surgeon on WHERE AV graft can be CANNULATED. Please review diagram prior to any cannulation of AV graft.  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  Low tidal volumes, low return volumes  MRSA PNA  Small effusions, PNA vs ESRD  P:  Changed extra long trach for better ventilation: size 4 Shiley and trach collar  Weaned to 35%  Continue Vancomycin as per ID section.  Duonebs for wheezing PRN  Consider PMV evaluation in 1-2 days if respiratory status remains stable.   CARDIOVASCULAR  CVL left femoral 06/29/14 >>8/9 A:  S/p cardiac arrest - presumed in setting right thigh AV graft hemorrhage.  ? Hx of V.Tach - latest progress note from [facility] states pt is on Amiodarone and Diltiazem, though, this not seen anywhere else in chart.  ? Endocarditis - was being treated for presumed endocarditis at [facility] (last dose of Abx would have been on 8/12)  P:  Goal MAP > 65.  Monitor cortisol.  Stress steroids (on Decadron as outpatient for respiratory failure for the pass 1 month). Will need prolong steroid wean.  8/9 change to prednisone on slow taper to be completed at [facility].  Hold outpatient Midodrine for now.  Hold off on  amiodarone/diltiazem for now.  NOTE: She has a diagram of her right femoral av graft from Vascular Surgeon on WHERE AV graft can be CANNULATED. Please review diagram prior to any cannulation of AV graft.   RENAL  A:  ESRD on dialysis T/Th/Sat - very limited access, right thigh AV fistula initially bleeding, now stable. Bilateral IJ's have had trouble in past, left groin has femoral TLC now converted to HD catheter. DC left thigh catheter 8/9 prior to tx to [facility].  NOTE: She has  a diagram of her right femoral av graft from Vascular Surgeon on WHERE AV graft can be CANNULATED. Please review diagram prior to any cannulation of AV graft.  Hypokalemia  P:  Renal following - appreciated input.  Per Vascular - " Plan discussed with patient's daughter. Options of diatek vs graft revision discussed. Graft revised 8/7   GASTROINTESTINAL  A:  Hep B surface antibody positive  GI prophylaxis  Nutrition  P:  Pepcid.  TF  HEMATOLOGIC   Recent Labs   07/03/14 0400  07/04/14 0437   HGB  9.3*  9.0*    A:  VTE prophylaxis  Chronic anemia  P:  SCD's.  Transfused 2unit of PRBC during HD ( total of 4 units) no 07/01/14   INFECTIOUS  A:  Blood Cx 7/18 and 7/21 >>> MRSA  Bacteremia  Diarrhea  P:  C.diff PCR >>>negative, previously positive, and currently on vanc taper  Abx: Vanc taper for chronic C.Diff(note 8/4 neg c dif)  Keflex per tube 8/7 per VVS>>x 7 days   ENDOCRINE  CBG (last 3)   Recent Labs   07/03/14 2355  07/04/14 0352  07/04/14 0711   GLUCAP  106*  102*  100*    A:  DM  Hypoglycemia  ? AI - on decadron 4mg  BID as outpatient since 7/6 (for suspected chronic respiratory failure).  P:  D50 PRN  CBG's q4hrs.  SSI.  Patient will require a slow taper (2-3 weeks) of steroids  Stress steroids,start wean 8/6, solucortef 50mg  IV q8 x 4 days, then reassess. Changed back to decadron 8/8   NEUROLOGIC  A:  TBI - s/p remote bilateral SDH's  Hx CVA  Acute Encephalopathy- resolved  Seizure disorder  P:  Continue outpatient Keppra.   Monitor.  TODAY'S SUMMARY: 68 y.o. F transferred from [facility] due to cardiac arrest after her right thigh AV fistula bled out. Vascular and Renal consulted. Currently on vent and off Levophed. She is on vanc for MRSA pneumonia and MSSA bacteremia and is C.Diff negative. Now improving and alert, R thigh graft revised by vascular on 07/02/14. She is ready to transfer back to [facility]. Will change IV solucortef to po decadron  and taper.     NOTE: She has a diagram of her right femoral av graft from Vascular Surgeon on WHERE AV graft can be CANNULATED. Please review diagram prior to any cannulation of AV graft.  Labs at discharge Lab Results  Component Value Date   CREATININE 2.40* 07/04/2014   BUN 30* 07/04/2014   NA 142 07/04/2014   K 3.8 07/04/2014   CL 102 07/04/2014   CO2 28 07/04/2014   Lab Results  Component Value Date   WBC 4.8 07/04/2014   HGB 9.0* 07/04/2014   HCT 27.8* 07/04/2014   MCV 91.4 07/04/2014   PLT 125* 07/04/2014   Lab Results  Component Value Date   ALT 27 06/29/2014   AST 31 06/29/2014   ALKPHOS 779* 06/29/2014  BILITOT 0.3 06/29/2014   Lab Results  Component Value Date   INR 1.18 07/01/2014   INR 0.9 01/16/2008   INR 1.0 12/03/2007    Current radiology studies No results found.  Disposition:  01-Home or Self Care     Medication List    STOP taking these medications       UNABLE TO FIND     UNABLE TO FIND     VANCOCIN HCL IV  Replaced by:  vancomycin 1 GM/200ML Soln      TAKE these medications       acetaminophen 160 MG/5ML solution  Commonly known as:  TYLENOL  Place 650 mg into feeding tube every 6 (six) hours as needed for mild pain or fever.     cephALEXin 250 MG/5ML suspension  Commonly known as:  KEFLEX  Place 10 mLs (500 mg total) into feeding tube 4 (four) times daily.     chlorhexidine 0.12 % solution  Commonly known as:  PERIDEX  Use as directed 15 mLs in the mouth or throat every 12 (twelve) hours.     darbepoetin 60 MCG/0.3ML Soln injection  Commonly known as:  ARANESP  Inject 60 mcg into the skin every 7 (seven) days.     dexamethasone 4 MG tablet  Commonly known as:  DECADRON  Give 4 mg by tube daily.     ipratropium-albuterol 0.5-2.5 (3) MG/3ML Soln  Commonly known as:  DUONEB  Take 3 mLs by nebulization every 6 (six) hours as needed (for shortness of breath).     levETIRAcetam 100 MG/ML solution  Commonly known as:  KEPPRA  Place 250 mg into feeding  tube every 12 (twelve) hours.     midodrine 5 MG tablet  Commonly known as:  PROAMATINE  Give 10 mg by tube every other day as needed (per [facility]).     modafinil 200 MG tablet  Commonly known as:  PROVIGIL  Take 200 mg by mouth daily.     ondansetron 4 MG/2ML Soln injection  Commonly known as:  ZOFRAN  Inject 4 mg into the vein every 6 (six) hours as needed for nausea or vomiting.     polyethylene glycol packet  Commonly known as:  MIRALAX / GLYCOLAX  Take 17 g by mouth daily as needed for mild constipation.     polyvinyl alcohol 1.4 % ophthalmic solution  Commonly known as:  LIQUIFILM TEARS  Place 1 drop into both eyes every 4 (four) hours as needed for dry eyes.     vancomycin 1 GM/200ML Soln  Commonly known as:  VANCOCIN  Inject 200 mLs (1,000 mg total) into the vein Every Tuesday,Thursday,and Saturday with dialysis.  Start taking on:  07/06/2014     vancomycin 50 mg/mL oral solution  Commonly known as:  VANCOCIN  Take 2.5 mLs (125 mg total) by mouth 4 (four) times daily.     vancomycin 50 mg/mL oral solution  Commonly known as:  VANCOCIN  Take 2.5 mLs (125 mg total) by mouth 2 (two) times daily.  Start taking on:  07/06/2014     vancomycin 50 mg/mL oral solution  Commonly known as:  VANCOCIN  Take 2.5 mLs (125 mg total) by mouth daily.  Start taking on:  07/14/2014     vitamin A & D ointment  Apply 1 application topically every 12 (twelve) hours.          Discharged Condition: poor  Time spent on discharge greater than 40 minutes.  Vital signs at  Discharge. Temp:  [98 F (36.7 C)-98.9 F (37.2 C)] 98.5 F (36.9 C) (08/09 0805) Pulse Rate:  [70-93] 86 (08/09 1400) Resp:  [11-27] 18 (08/09 1400) BP: (117-179)/(68-93) 179/92 mmHg (08/09 1400) SpO2:  [94 %-100 %] 100 % (08/09 1400) FiO2 (%):  [28 %] 28 % (08/09 1400) Weight:  [181 lb (82.1 kg)-195 lb 8.8 oz (88.7 kg)] 181 lb (82.1 kg) (08/09 0500) Office follow up Special Information or instructions. [facility]. Signed: Brett CanalesSteve Minor ACNP [facility] Pager [phone] till 3 pm If no answer page [phone] 07/04/2014, 2:46 PM  Coralyn HellingVineet Dellie Piasecki, MD [facility] Pulmonary/Critical Care 07/04/2014, 3:09 PM Pager:  [phone] After 3pm call: [phone]

## 2014-07-04 NOTE — Progress Notes (Addendum)
1430  report called to Aurea GraffJoan, Char[facility] fundraiserN [facility] 1530 update called to [facility], confirmed understanding regarding diaysis access of Right thigh graft, picture from MD sent with patient. Attempted to call Dtr Marylene LandAngela 4 times to report transfer to [facility], no answer

## 2014-07-04 NOTE — Progress Notes (Signed)
PULMONARY / CRITICAL CARE MEDICINE   Name: [name] MRN: [id number] DOB: 1946/10/29    ADMISSION DATE:  06/29/2014 CONSULTATION DATE:  06/29/2014  REFERRING MD :  [facility]  CHIEF COMPLAINT:  Cardiac arrest s/p right groin AV fistula/graft exanguination  INITIAL PRESENTATION: 68 y.o. F from [facility] transferred to [facility] ICU after she suffered a cardiac arrest when her right thigh AV fistula/graft bled out.  STUDIES:  8/1 CT Abd >>> no acute process, diverticulosis without acute diverticulitis, small b/l pleural effusions, multiple bilateral renal cysts. 8/6 CT Abd >>No acute retroperitoneal or intraperitoneal hemorrhage. Stable appearance of the abdomen and pelvis. Interval left femoral venous catheter placement.   SIGNIFICANT EVENTS: 8/1 seen in [facility] ED for abd pain 8/4 right groin AV fistula/graft bled out which led to cardiac arrest, transferred to [facility] ICU. 8/4 left HD cath placed 8/5 - HgB 5.4 transfused 2 units of blood 8/6 - HgB 6.7 transfused 2 units of blood, changed trach to XLT Shiley 4 with trach collar 8/7- placed on TCT 8/7 graft revision per VVS. Right thigh. 8/9 graft usable but there is a diagram on how to cannulate it. 8/9 attempt transfer back to [facility]. SUBJECTIVE:  NAD  VITAL SIGNS: Temp:  [98 F (36.7 C)-98.9 F (37.2 C)] 98.5 F (36.9 C) (08/09 0805) Pulse Rate:  [70-93] 88 (08/09 0844) Resp:  [11-27] 24 (08/09 0844) BP: (117-157)/(68-89) 144/89 mmHg (08/09 0700) SpO2:  [98 %-100 %] 100 % (08/09 0844) FiO2 (%):  [28 %] 28 % (08/09 0844) Weight:  [181 lb (82.1 kg)-195 lb 8.8 oz (88.7 kg)] 181 lb (82.1 kg) (08/09 0500) VENTILATOR SETTINGS: Vent Mode:  [-]  FiO2 (%):  [28 %] 28 % INTAKE / OUTPUT:  Intake/Output Summary (Last 24 hours) at 07/04/14 0853 Last data filed at 07/04/14 0800  Gross per 24 hour  Intake   1000 ml  Output   2350 ml  Net  -1350 ml    PHYSICAL EXAMINATION: General: Obese female, resting in bed, in NAD. Neuro: Arouses to  physical stimulus and voice. HEENT: Delta/AT. PERRL, sclerae anicteric.  Trach in place, C/D/I. Cardiovascular: RRR, no M/R/G.  Lungs: Respirations even and unlabored. On full vent support Abdomen: BS x 4, soft, NT/ND.  PEG in place. Musculoskeletal: No gross deformities, no edema.  Skin: Intact, warm, no rashes. Rt thigh graft intact   LABS:  CBC  Recent Labs Lab 07/02/14 0300 07/03/14 0400 07/04/14 0437  WBC 4.9 6.0 4.8  HGB 9.2* 9.3* 9.0*  HCT 27.3* 28.4* 27.8*  PLT 109* 122* 125*   Coag's  Recent Labs Lab 07/01/14 1430  APTT 38*  INR 1.18   BMET  Recent Labs Lab 07/02/14 0300 07/03/14 0400 07/04/14 0437  NA 137 137 142  K 3.5* 3.9 3.8  CL 97 98 102  CO2 27 24 28   BUN 33* 49* 30*  CREATININE 2.58* 3.59* 2.40*  GLUCOSE 71 124* 89   Electrolytes  Recent Labs Lab 06/29/14 1114 06/30/14 0500 07/01/14 0443 07/02/14 0300 07/03/14 0400 07/04/14 0437  CALCIUM 8.9 9.0 8.4 8.1* 7.9* 8.7  MG 2.1 2.0  --   --   --   --   PHOS 4.3 3.9 3.4 3.1 4.1  --    Sepsis Markers  Recent Labs Lab 06/29/14 1300 06/29/14 1713 06/29/14 2300  LATICACIDVEN 7.1* 5.0* 3.5*   Liver Enzymes  Recent Labs Lab 06/29/14 1114 07/01/14 0443 07/02/14 0300 07/03/14 0400  AST 31  --   --   --  ALT 27  --   --   --   ALKPHOS 779*  --   --   --   BILITOT 0.3  --   --   --   ALBUMIN 2.7* 2.4* 2.8* 2.8*   Cardiac Enzymes  Recent Labs Lab 06/29/14 1113 06/29/14 1713 06/29/14 2300  TROPONINI <0.30 <0.30 <0.30   Glucose  Recent Labs Lab 07/03/14 1213 07/03/14 1623 07/03/14 1955 07/03/14 2355 07/04/14 0352 07/04/14 0711  GLUCAP 101* 116* 111* 106* 102* 100*    Imaging No results found. Antibiotic History From [facility] (no IV access) Linezolid 7/14>>7/24, MRSA PNA Levaquin 7/14>>7/22, PNA Vanc 7/22>>  Cultures from [facility] Resp Cx 7/12 >> MRSA, Sensitive vanc and linezolid Bld Cx 7/15, 2/2 (+) MSSA, oxacillin sensitive Blx Cx 7/18, 1/2 (+) MSSA Bld Cx  7/20, no growth C.Diff stool (+) 7/14,  Negative 8/4. 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  Low tidal volumes, low return volumes MRSA PNA Small effusions, PNA vs ESRD P:   Changed extra long trach for better ventilation: size 4 Shiley and trach collar Weaned to 35% Continue Vancomycin as per ID section. Duonebs for wheezing PRN Consider PMV evaluation in 1-2 days if respiratory status remains stable.    CARDIOVASCULAR CVL left femoral 06/29/14 A:  S/p cardiac arrest - presumed in setting right thigh AV graft hemorrhage. ? Hx of V.Tach - latest progress note from [facility] states pt is on Amiodarone and Diltiazem, though, this not seen anywhere else in chart. ? Endocarditis - was being treated for presumed endocarditis at [facility] (last dose of Abx would have been on 8/12) P:  Goal MAP > 65. Monitor cortisol. Stress steroids (on Decadron as outpatient for respiratory failure for the pass 1 month).  Will need prolong steroid wean.  8/9 change to prednisone on slow taper to be completed at [facility]. Hold outpatient Midodrine for now. Hold off on amiodarone/diltiazem for now.   RENAL A:  [facility] on dialysis T/Th/Sat - very limited access, right thigh AV fistula initially bleeding, now stable.  Bilateral IJ's have had trouble in past, left groin has femoral TLC now converted to HD catheter. DC left thigh catheter 8/9 prior to tx to [facility]. Hypokalemia P:   Renal following - appreciated input.  Per Vascular - " Plan discussed with patient's daughter. Options of diatek vs graft revision discussed. Graft revised 8/7  GASTROINTESTINAL A:  Hep B surface antibody positive GI prophylaxis Nutrition P:   Pepcid. TF   HEMATOLOGIC  Recent Labs  07/03/14 0400 07/04/14 0437  HGB 9.3* 9.0*    A:  VTE prophylaxis Chronic anemia  P:  SCD's. Transfused 2unit of PRBC during HD ( total of 4 units) no  07/01/14  INFECTIOUS A:  Blood Cx 7/18 and 7/21 >>> MRSA  Bacteremia Diarrhea P:   C.diff PCR >>>negative, previously positive, and currently on vanc taper Abx: Vanc taper for chronic C.Diff(note 8/4 neg c dif) Keflex per tube 8/7 per VVS>>x 7 days  ENDOCRINE CBG (last 3)   Recent Labs  07/03/14 2355 07/04/14 0352 07/04/14 0711  GLUCAP 106* 102* 100*     A:  DM Hypoglycemia ? AI - on decadron 4mg  BID as outpatient since 7/6  (for suspected chronic respiratory failure).  P:   D50 PRN CBG's q4hrs. SSI. Patient will require a slow taper (2-3 weeks) of steroids  Stress steroids,start wean 8/6, solucortef 50mg  IV q8 x  4 days, then reassess. Changed back to decadron 8/8  NEUROLOGIC A:  TBI - s/p remote bilateral SDH's  Hx CVA Acute Encephalopathy- resolved Seizure disorder P:   Continue outpatient Keppra. Monitor.    TODAY'S SUMMARY: 68 y.o. F transferred from [facility] due to cardiac arrest after her right thigh AV fistula bled out.  Vascular and Renal consulted.  Currently on vent and off Levophed.  She is on vanc for MRSA pneumonia and MSSA bacteremia and is C.Diff negative.  Now improving and alert,  R thigh graft revised by vascular on 07/02/14. She is ready to transfer back to [facility]. Will change IV solucortef to po decadron and taper.   Brett CanalesSteve Minor ACNP [facility] Pager [phone] till 3 pm If no answer page [phone] 07/04/2014, 8:53 AM  Reviewed above, and examined.  She is medically stable for transfer back to [facility] when bed available.  Coralyn HellingVineet Bastion Bolger, MD [facility] Pulmonary/Critical Care 07/04/2014, 2:26 PM Pager:  [phone] After 3pm call: [phone]

## 2014-07-05 ENCOUNTER — Encounter (HOSPITAL_COMMUNITY): Payer: Self-pay | Admitting: Vascular Surgery

## 2014-08-10 ENCOUNTER — Encounter: Payer: Self-pay | Admitting: Vascular Surgery

## 2014-08-11 ENCOUNTER — Ambulatory Visit: Payer: Medicare Other | Admitting: Vascular Surgery

## 2014-08-12 ENCOUNTER — Other Ambulatory Visit (HOSPITAL_COMMUNITY): Payer: Self-pay | Admitting: Internal Medicine

## 2014-08-12 DIAGNOSIS — Z992 Dependence on renal dialysis: Secondary | ICD-10-CM

## 2014-08-12 DIAGNOSIS — T8241XA Breakdown (mechanical) of vascular dialysis catheter, initial encounter: Secondary | ICD-10-CM

## 2014-08-12 DIAGNOSIS — N186 End stage renal disease: Secondary | ICD-10-CM

## 2014-08-13 ENCOUNTER — Encounter (HOSPITAL_COMMUNITY): Payer: Self-pay | Admitting: Pharmacy Technician

## 2014-08-16 ENCOUNTER — Ambulatory Visit (HOSPITAL_COMMUNITY)
Admission: RE | Admit: 2014-08-16 | Discharge: 2014-08-16 | Disposition: A | Discharge status: Home / Self Care | Payer: Medicare Other | Source: Ambulatory Visit | Attending: Internal Medicine | Admitting: Internal Medicine

## 2014-08-16 ENCOUNTER — Other Ambulatory Visit (HOSPITAL_COMMUNITY): Payer: Self-pay | Admitting: Internal Medicine

## 2014-08-16 DIAGNOSIS — Z992 Dependence on renal dialysis: Secondary | ICD-10-CM | POA: Diagnosis not present

## 2014-08-16 DIAGNOSIS — T8241XA Breakdown (mechanical) of vascular dialysis catheter, initial encounter: Secondary | ICD-10-CM

## 2014-08-16 DIAGNOSIS — Y832 Surgical operation with anastomosis, bypass or graft as the cause of abnormal reaction of the patient, or of later complication, without mention of misadventure at the time of the procedure: Secondary | ICD-10-CM | POA: Insufficient documentation

## 2014-08-16 DIAGNOSIS — I871 Compression of vein: Secondary | ICD-10-CM | POA: Insufficient documentation

## 2014-08-16 DIAGNOSIS — T82898A Other specified complication of vascular prosthetic devices, implants and grafts, initial encounter: Secondary | ICD-10-CM | POA: Insufficient documentation

## 2014-08-16 DIAGNOSIS — N186 End stage renal disease: Secondary | ICD-10-CM | POA: Diagnosis present

## 2014-08-16 MED ORDER — IOHEXOL 300 MG/ML  SOLN
100.0000 mL | Freq: Once | INTRAMUSCULAR | Status: AC | PRN
  Administered 2014-08-16: 40 mL via INTRAVENOUS

## 2014-08-16 MED ORDER — LIDOCAINE HCL 1 % IJ SOLN
INTRAMUSCULAR | Status: AC
  Filled 2014-08-16: qty 20

## 2014-08-16 NOTE — Procedures (Signed)
Interventional Radiology Procedure Note  Procedure: Fistulagram and PTA of venous stenosis to 5 mm. Complications: none Recommendations: - May resume dialysis - Sutures out at 48 hrs  Signed,  Sterling Big, MD

## 2014-09-08 ENCOUNTER — Emergency Department (HOSPITAL_COMMUNITY): Payer: Medicare Other

## 2014-09-08 ENCOUNTER — Inpatient Hospital Stay (HOSPITAL_COMMUNITY)
Admission: EM | Admit: 2014-09-08 | Discharge: 2014-09-10 | DRG: 252 | Disposition: A | Discharge status: Hospice | Payer: Medicare Other | Attending: Internal Medicine | Admitting: Internal Medicine

## 2014-09-08 DIAGNOSIS — S728X2A Other fracture of left femur, initial encounter for closed fracture: Secondary | ICD-10-CM | POA: Diagnosis not present

## 2014-09-08 DIAGNOSIS — Z87820 Personal history of traumatic brain injury: Secondary | ICD-10-CM

## 2014-09-08 DIAGNOSIS — Z930 Tracheostomy status: Secondary | ICD-10-CM | POA: Diagnosis not present

## 2014-09-08 DIAGNOSIS — Z8673 Personal history of transient ischemic attack (TIA), and cerebral infarction without residual deficits: Secondary | ICD-10-CM

## 2014-09-08 DIAGNOSIS — Z9911 Dependence on respirator [ventilator] status: Secondary | ICD-10-CM | POA: Diagnosis not present

## 2014-09-08 DIAGNOSIS — B9562 Methicillin resistant Staphylococcus aureus infection as the cause of diseases classified elsewhere: Secondary | ICD-10-CM | POA: Diagnosis not present

## 2014-09-08 DIAGNOSIS — G40909 Epilepsy, unspecified, not intractable, without status epilepticus: Secondary | ICD-10-CM | POA: Diagnosis not present

## 2014-09-08 DIAGNOSIS — T82868D Thrombosis of vascular prosthetic devices, implants and grafts, subsequent encounter: Secondary | ICD-10-CM

## 2014-09-08 DIAGNOSIS — A0472 Enterocolitis due to Clostridium difficile, not specified as recurrent: Secondary | ICD-10-CM

## 2014-09-08 DIAGNOSIS — J151 Pneumonia due to Pseudomonas: Secondary | ICD-10-CM | POA: Diagnosis present

## 2014-09-08 DIAGNOSIS — D649 Anemia, unspecified: Secondary | ICD-10-CM

## 2014-09-08 DIAGNOSIS — A047 Enterocolitis due to Clostridium difficile: Secondary | ICD-10-CM | POA: Diagnosis present

## 2014-09-08 DIAGNOSIS — I120 Hypertensive chronic kidney disease with stage 5 chronic kidney disease or end stage renal disease: Secondary | ICD-10-CM | POA: Diagnosis not present

## 2014-09-08 DIAGNOSIS — I9589 Other hypotension: Secondary | ICD-10-CM | POA: Diagnosis not present

## 2014-09-08 DIAGNOSIS — Z823 Family history of stroke: Secondary | ICD-10-CM

## 2014-09-08 DIAGNOSIS — R578 Other shock: Secondary | ICD-10-CM

## 2014-09-08 DIAGNOSIS — Z79899 Other long term (current) drug therapy: Secondary | ICD-10-CM | POA: Diagnosis not present

## 2014-09-08 DIAGNOSIS — N2581 Secondary hyperparathyroidism of renal origin: Secondary | ICD-10-CM | POA: Diagnosis not present

## 2014-09-08 DIAGNOSIS — R7881 Bacteremia: Secondary | ICD-10-CM | POA: Diagnosis present

## 2014-09-08 DIAGNOSIS — M79605 Pain in left leg: Secondary | ICD-10-CM | POA: Diagnosis present

## 2014-09-08 DIAGNOSIS — G4733 Obstructive sleep apnea (adult) (pediatric): Secondary | ICD-10-CM | POA: Diagnosis not present

## 2014-09-08 DIAGNOSIS — N186 End stage renal disease: Secondary | ICD-10-CM

## 2014-09-08 DIAGNOSIS — Z833 Family history of diabetes mellitus: Secondary | ICD-10-CM | POA: Diagnosis not present

## 2014-09-08 DIAGNOSIS — Z87891 Personal history of nicotine dependence: Secondary | ICD-10-CM

## 2014-09-08 DIAGNOSIS — T82898A Other specified complication of vascular prosthetic devices, implants and grafts, initial encounter: Secondary | ICD-10-CM | POA: Diagnosis not present

## 2014-09-08 DIAGNOSIS — N19 Unspecified kidney failure: Secondary | ICD-10-CM

## 2014-09-08 DIAGNOSIS — S7290XA Unspecified fracture of unspecified femur, initial encounter for closed fracture: Secondary | ICD-10-CM | POA: Diagnosis present

## 2014-09-08 DIAGNOSIS — E1129 Type 2 diabetes mellitus with other diabetic kidney complication: Secondary | ICD-10-CM

## 2014-09-08 DIAGNOSIS — Z880 Allergy status to penicillin: Secondary | ICD-10-CM

## 2014-09-08 DIAGNOSIS — J15212 Pneumonia due to Methicillin resistant Staphylococcus aureus: Secondary | ICD-10-CM

## 2014-09-08 DIAGNOSIS — S7292XA Unspecified fracture of left femur, initial encounter for closed fracture: Secondary | ICD-10-CM

## 2014-09-08 DIAGNOSIS — Z992 Dependence on renal dialysis: Secondary | ICD-10-CM | POA: Diagnosis not present

## 2014-09-08 DIAGNOSIS — J9621 Acute and chronic respiratory failure with hypoxia: Secondary | ICD-10-CM

## 2014-09-08 DIAGNOSIS — E119 Type 2 diabetes mellitus without complications: Secondary | ICD-10-CM | POA: Diagnosis present

## 2014-09-08 DIAGNOSIS — Z7952 Long term (current) use of systemic steroids: Secondary | ICD-10-CM | POA: Diagnosis not present

## 2014-09-08 DIAGNOSIS — Y832 Surgical operation with anastomosis, bypass or graft as the cause of abnormal reaction of the patient, or of later complication, without mention of misadventure at the time of the procedure: Secondary | ICD-10-CM | POA: Diagnosis not present

## 2014-09-08 DIAGNOSIS — S72402A Unspecified fracture of lower end of left femur, initial encounter for closed fracture: Secondary | ICD-10-CM

## 2014-09-08 DIAGNOSIS — D631 Anemia in chronic kidney disease: Secondary | ICD-10-CM | POA: Diagnosis present

## 2014-09-08 DIAGNOSIS — R52 Pain, unspecified: Secondary | ICD-10-CM

## 2014-09-08 DIAGNOSIS — S7292XK Unspecified fracture of left femur, subsequent encounter for closed fracture with nonunion: Secondary | ICD-10-CM

## 2014-09-08 LAB — CBC WITH DIFFERENTIAL/PLATELET
BASOS ABS: 0 10*3/uL (ref 0.0–0.1)
BASOS PCT: 0 % (ref 0–1)
EOS ABS: 0 10*3/uL (ref 0.0–0.7)
Eosinophils Relative: 0 % (ref 0–5)
HCT: 22.2 % — ABNORMAL LOW (ref 36.0–46.0)
Hemoglobin: 7.3 g/dL — ABNORMAL LOW (ref 12.0–15.0)
Lymphocytes Relative: 6 % — ABNORMAL LOW (ref 12–46)
Lymphs Abs: 0.5 10*3/uL — ABNORMAL LOW (ref 0.7–4.0)
MCH: 28.2 pg (ref 26.0–34.0)
MCHC: 32.9 g/dL (ref 30.0–36.0)
MCV: 85.7 fL (ref 78.0–100.0)
Monocytes Absolute: 0.2 10*3/uL (ref 0.1–1.0)
Monocytes Relative: 3 % (ref 3–12)
NEUTROS PCT: 91 % — AB (ref 43–77)
Neutro Abs: 6.9 10*3/uL (ref 1.7–7.7)
PLATELETS: 191 10*3/uL (ref 150–400)
RBC: 2.59 MIL/uL — ABNORMAL LOW (ref 3.87–5.11)
RDW: 17.5 % — ABNORMAL HIGH (ref 11.5–15.5)
WBC: 7.6 10*3/uL (ref 4.0–10.5)

## 2014-09-08 LAB — BASIC METABOLIC PANEL
ANION GAP: 27 — AB (ref 5–15)
BUN: 157 mg/dL — AB (ref 6–23)
CO2: 20 mEq/L (ref 19–32)
CREATININE: 4.66 mg/dL — AB (ref 0.50–1.10)
Calcium: 8.8 mg/dL (ref 8.4–10.5)
Chloride: 85 mEq/L — ABNORMAL LOW (ref 96–112)
GFR, EST AFRICAN AMERICAN: 10 mL/min — AB (ref >90)
GFR, EST NON AFRICAN AMERICAN: 9 mL/min — AB (ref >90)
Glucose, Bld: 176 mg/dL — ABNORMAL HIGH (ref 70–99)
Potassium: 4.6 mEq/L (ref 3.7–5.3)
Sodium: 132 mEq/L — ABNORMAL LOW (ref 137–147)

## 2014-09-08 LAB — CBG MONITORING, ED: Glucose-Capillary: 75 mg/dL (ref 70–99)

## 2014-09-08 MED ORDER — FENTANYL CITRATE 0.05 MG/ML IJ SOLN
100.0000 ug | Freq: Once | INTRAMUSCULAR | Status: AC
  Administered 2014-09-08: 100 ug via INTRAVENOUS
  Filled 2014-09-08: qty 2

## 2014-09-08 MED ORDER — IPRATROPIUM-ALBUTEROL 0.5-2.5 (3) MG/3ML IN SOLN: 3.0000 mL | Freq: Four times a day (QID) | RESPIRATORY_TRACT | Status: DC | PRN

## 2014-09-08 MED ORDER — SODIUM CHLORIDE 0.9 % IV SOLN: 250.0000 mL | INTRAVENOUS | Status: DC | PRN

## 2014-09-08 MED ORDER — VANCOMYCIN 50 MG/ML ORAL SOLUTION
125.0000 mg | Freq: Four times a day (QID) | ORAL | Status: DC
  Administered 2014-09-09 – 2014-09-10 (×8): 125 mg
  Filled 2014-09-08 (×11): qty 2.5

## 2014-09-08 MED ORDER — VANCOMYCIN HCL IN DEXTROSE 1-5 GM/200ML-% IV SOLN
1000.0000 mg | INTRAVENOUS | Status: DC
  Administered 2014-09-09: 1000 mg via INTRAVENOUS
  Filled 2014-09-08 (×2): qty 200

## 2014-09-08 MED ORDER — ACETAMINOPHEN 160 MG/5ML PO SOLN: 650.0000 mg | Freq: Four times a day (QID) | ORAL | Status: DC | PRN

## 2014-09-08 MED ORDER — PANTOPRAZOLE SODIUM 40 MG IV SOLR
40.0000 mg | Freq: Every day | INTRAVENOUS | Status: DC
  Administered 2014-09-09 (×2): 40 mg via INTRAVENOUS
  Filled 2014-09-08 (×3): qty 40

## 2014-09-08 MED ORDER — MODAFINIL 100 MG PO TABS
100.0000 mg | ORAL_TABLET | Freq: Every day | ORAL | Status: DC
  Administered 2014-09-09 – 2014-09-10 (×2): 100 mg via ORAL
  Filled 2014-09-08 (×4): qty 1

## 2014-09-08 MED ORDER — ONDANSETRON HCL 4 MG/2ML IJ SOLN: 4.0000 mg | Freq: Four times a day (QID) | INTRAMUSCULAR | Status: DC | PRN

## 2014-09-08 MED ORDER — DEXTROSE 5 % IV SOLN
2.0000 g | INTRAVENOUS | Status: DC
  Administered 2014-09-09: 2 g via INTRAVENOUS
  Filled 2014-09-08: qty 2

## 2014-09-08 MED ORDER — FENTANYL CITRATE 0.05 MG/ML IJ SOLN
25.0000 ug | INTRAMUSCULAR | Status: DC | PRN
  Administered 2014-09-09: 50 ug via INTRAVENOUS
  Filled 2014-09-08: qty 2

## 2014-09-08 MED ORDER — DEXAMETHASONE 2 MG PO TABS
2.0000 mg | ORAL_TABLET | Freq: Every day | ORAL | Status: DC
  Administered 2014-09-09 – 2014-09-10 (×2): 2 mg
  Filled 2014-09-08 (×2): qty 1

## 2014-09-08 MED ORDER — POLYETHYLENE GLYCOL 3350 17 G PO PACK
17.0000 g | PACK | Freq: Every day | ORAL | Status: DC | PRN
  Filled 2014-09-08: qty 1

## 2014-09-08 MED ORDER — VITAL HIGH PROTEIN PO LIQD
1000.0000 mL | ORAL | Status: DC
  Filled 2014-09-08 (×2): qty 1000

## 2014-09-08 MED ORDER — POLYVINYL ALCOHOL 1.4 % OP SOLN
1.0000 [drp] | OPHTHALMIC | Status: DC | PRN
  Filled 2014-09-08: qty 15

## 2014-09-08 MED ORDER — HEPARIN SODIUM (PORCINE) 5000 UNIT/ML IJ SOLN
5000.0000 [IU] | Freq: Three times a day (TID) | INTRAMUSCULAR | Status: DC
  Administered 2014-09-09 – 2014-09-10 (×3): 5000 [IU] via SUBCUTANEOUS
  Filled 2014-09-08 (×7): qty 1

## 2014-09-08 MED ORDER — LEVETIRACETAM 100 MG/ML PO SOLN
250.0000 mg | Freq: Two times a day (BID) | ORAL | Status: DC
  Administered 2014-09-09 – 2014-09-10 (×4): 250 mg
  Filled 2014-09-08 (×5): qty 2.5

## 2014-09-08 MED ORDER — VITAMINS A & D EX OINT
1.0000 "application " | TOPICAL_OINTMENT | Freq: Two times a day (BID) | CUTANEOUS | Status: DC
  Administered 2014-09-09 – 2014-09-10 (×4): 1 via TOPICAL
  Filled 2014-09-08: qty 5

## 2014-09-08 MED ORDER — MIDODRINE HCL 5 MG PO TABS
10.0000 mg | ORAL_TABLET | Freq: Three times a day (TID) | ORAL | Status: DC
  Administered 2014-09-09 – 2014-09-10 (×5): 10 mg
  Filled 2014-09-08 (×7): qty 2

## 2014-09-08 MED ORDER — CINACALCET HCL 30 MG PO TABS
90.0000 mg | ORAL_TABLET | Freq: Every day | ORAL | Status: DC
  Administered 2014-09-09 – 2014-09-10 (×2): 90 mg via ORAL
  Filled 2014-09-08 (×3): qty 3

## 2014-09-08 MED ORDER — VANCOMYCIN HCL 10 G IV SOLR
2000.0000 mg | Freq: Once | INTRAVENOUS | Status: AC
  Administered 2014-09-09: 2000 mg via INTRAVENOUS
  Filled 2014-09-08: qty 2000

## 2014-09-08 MED ORDER — FREE WATER
100.0000 mL | Freq: Four times a day (QID) | Status: DC
  Administered 2014-09-09 – 2014-09-10 (×8): 100 mL
  Filled 2014-09-08 (×8): qty 100

## 2014-09-08 MED ORDER — INSULIN ASPART 100 UNIT/ML ~~LOC~~ SOLN
2.0000 [IU] | SUBCUTANEOUS | Status: DC
  Administered 2014-09-10: 2 [IU] via SUBCUTANEOUS

## 2014-09-08 NOTE — ED Notes (Signed)
Paged Ortho  

## 2014-09-08 NOTE — Progress Notes (Signed)
ANTIBIOTIC CONSULT NOTE - INITIAL  Pharmacy Consult for Cefepime/Vancomycin Indication: pneumonia and h/o MSSA septicemia  Allergies  Allergen Reactions  . Penicillins Hives and Rash   Vital Signs: BP: 114/70 mmHg (10/14 2019) Pulse Rate: 78 (10/14 2019)  Labs:  Recent Labs  09/08/14 1653  WBC 7.6  HGB 7.3*  PLT 191  CREATININE 4.66*   The CrCl is unknown because both a height and weight (above a minimum accepted value) are required for this calculation. No results found for this basename: VANCOTROUGH, VANCOPEAK, VANCORANDOM, GENTTROUGH, GENTPEAK, GENTRANDOM, TOBRATROUGH, TOBRAPEAK, TOBRARND, AMIKACINPEAK, AMIKACINTROU, AMIKACIN,  in the last 72 hours   Microbiology: No results found for this or any previous visit (from the past 720 hour(s)).  Medical History: Past Medical History  Diagnosis Date  . Hypertension   . ESRD (end stage renal disease)   . Stroke   . Diabetes mellitus   . Subdural hematoma   . Seizure disorder   . Anemia   . Secondary hyperparathyroidism   . Cataract   . MRSA (methicillin resistant Staphylococcus aureus)   . C. difficile colitis   . MRSA bacteremia   . Central sleep apnea   . Ventricular tachycardia    Assessment: 68 y/o F w/ left leg femur fracture. Ventilator dependent from TBI and ESRD dialysis patient. No temp documented, BP 107/70, no leukocytosis, Hgb 7.3, Hct 22.2. HD schedule TThSat, due for dialysis 10/15. Empiric antibiotic therapy for pneumonia and hx of MSSA septicemia. Pt has already been started on cefepime and received a dose today.   Goal of Therapy:  Pre-dialysis Vancomycin level of 15-25  Plan:  Cefepime IV 2g qHD Vancomycin IV 2000 mg x 1 then Vancomycin 1000 mg qHD F/u vancomycin trough, blood cultures and sensitivities  Marisue BrooklynGazda, Nicholas 09/08/2014,9:14 PM  I have made appropriate edits and agree with the assessment and plan.  Lysle Pearlachel Brande Uncapher, PharmD, BCPS Pager # [phone] 09/08/2014 9:34 PM

## 2014-09-08 NOTE — ED Notes (Signed)
Pt. Arrived from [facility], with a report of a lt. Upper leg deformity.  Diagnosed with a lt. Femur fx of non-traumatic.  Pt. Arrived vent dependent. Dialysis pt. Able to communicate with gestures.  Family requested pt. To come for evaluation.  Lt. Upper leg bruised  And swollen.  Family at the bedside

## 2014-09-08 NOTE — Progress Notes (Signed)
Orthopedic Tech Progress Note Patient Details:  Michelene HeadySandra A Feltes 1946/06/30 [id number]  Ortho Devices Type of Ortho Device: Knee Immobilizer Ortho Device/Splint Location: LLE Ortho Device/Splint Interventions: Ordered;Application   Jennye MoccasinHughes, Ashea Winiarski Craig 09/08/2014, 8:25 PM

## 2014-09-08 NOTE — ED Notes (Signed)
Attempted Report x1.   

## 2014-09-08 NOTE — ED Provider Notes (Signed)
CSN: [id number]     Arrival date & time 09/08/14  1559 History   First MD Initiated Contact with Patient 09/08/14 1619     Chief Complaint  Patient presents with  . Leg Injury      Patient is a 68 y.o. female presenting with leg pain. The history is provided by a relative. The history is limited by the condition of the patient.  Leg Pain Location:  Leg Leg location:  L upper leg Pain details:    Timing:  Constant   Progression:  Unchanged Relieved by:  Rest Exacerbated by: movement. Patient presents from nursing facility for left leg pain/swelling Per daughter, [facility].  She is a dialysis patient, she is bed bound and she is ventilator dependent.  She reports that starting yesterday it was noticed that her left leg was swollen and she appeared to be in pain.  Daughter reports xray was performed today and noted to have left femur fracture.  Daughter reports she is unaware of any incident that could have caused this injury.  Pt is dependent on dialysis and currently has no access for hemodialysis but is planned to have access placed tomorrow.   Past Medical History  Diagnosis Date  . Hypertension   . ESRD (end stage renal disease)   . Stroke   . Diabetes mellitus   . Subdural hematoma   . Seizure disorder   . Anemia   . Secondary hyperparathyroidism   . Cataract   . MRSA (methicillin resistant Staphylococcus aureus)   . C. difficile colitis   . MRSA bacteremia   . Central sleep apnea   . Ventricular tachycardia    Past Surgical History  Procedure Laterality Date  . Cholecystectomy    . Vascular surgery    . Thyroidectomy    . Eye surgery    . Craniotomy    . Thrombectomy and revision of arterioventous (av) goretex  graft Right 07/02/2014    Procedure: THROMBECTOMY AND REVISION OF VENOUS SIDE OF ARTERIOVENTOUS (AV) GORETEX  GRAFT-RIGHT THIGH;  Surgeon: Chuck Hinthristopher S Dickson, MD;  Location: [facility] OR;  Service: Vascular;  Laterality: Right;   Family  History  Problem Relation Age of Onset  . Hypertension Mother   . Diabetes Mellitus II Mother   . Stroke Father   . Hypertension Father    History  Substance Use Topics  . Smoking status: Former Smoker -- 1.50 packs/day for 20 years    Types: Cigarettes    Quit date: 11/26/1998  . Smokeless tobacco: Not on file  . Alcohol Use: No   OB History   Grav Para Term Preterm Abortions TAB SAB Ect Mult Living                 Review of Systems  Unable to perform ROS: Patient nonverbal      Allergies  Penicillins  Home Medications   Prior to Admission medications   Medication Sig Start Date End Date Taking? Authorizing Provider  acetaminophen (TYLENOL) 160 MG/5ML solution Place 650 mg into feeding tube every 6 (six) hours as needed for mild pain or fever.    Historical Provider, MD  ALBUMIN, HUMAN, IV Inject 25 g into the vein 3 (three) times a week. IVPB    Historical Provider, MD  chlorhexidine (PERIDEX) 0.12 % solution Place 15 mLs onto teeth every 12 (twelve) hours.     Historical Provider, MD  cinacalcet (SENSIPAR) 90 MG tablet 90 mg by Feeding Tube route daily.  Historical Provider, MD  darbepoetin (ARANESP) 60 MCG/0.3ML SOLN injection Inject 60 mcg into the skin every 7 (seven) days.    Historical Provider, MD  dexamethasone (DECADRON) 2 MG tablet 2 mg by Feeding Tube route daily.    Historical Provider, MD  ipratropium-albuterol (DUONEB) 0.5-2.5 (3) MG/3ML SOLN Take 3 mLs by nebulization every 6 (six) hours as needed (for shortness of breath).    Historical Provider, MD  levETIRAcetam (KEPPRA) 100 MG/ML solution Place 250 mg into feeding tube every 12 (twelve) hours.    Historical Provider, MD  midodrine (PROAMATINE) 5 MG tablet 5 mg by Feeding Tube route every 8 (eight) hours.     Historical Provider, MD  modafinil (PROVIGIL) 100 MG tablet Take 100 mg by mouth daily.    Historical Provider, MD  Nutritional Supplements (NOVASOURCE RENAL) LIQD 45 mL/hr by Feeding Tube route  continuous.    Historical Provider, MD  ondansetron (ZOFRAN) 4 MG/2ML SOLN injection Inject 4 mg into the vein every 6 (six) hours as needed for nausea or vomiting.    Historical Provider, MD  polyethylene glycol (MIRALAX / GLYCOLAX) packet 17 g by Feeding Tube route daily as needed for mild constipation. As needed    Historical Provider, MD  polyvinyl alcohol (LIQUIFILM TEARS) 1.4 % ophthalmic solution Place 1 drop into both eyes every 4 (four) hours as needed for dry eyes.     Historical Provider, MD  STERILE WATER PO 100 mLs by Feeding Tube route every 6 (six) hours.    Historical Provider, MD  Vitamins A & D (VITAMIN A & D) ointment Apply 1 application topically every 12 (twelve) hours.    Historical Provider, MD   Resp 24 Physical Exam CONSTITUTIONAL: elderly HEAD: Normocephalic/atraumatic EYES: EOMI ENMT: Mucous membranes moist NECK: trach in place.  No bleeding noted surrounding site SPINE:no bruising noted to back LUNGS:  no apparent distress ABDOMEN: soft, nontender NEURO: Pt is awake/alert, she will respond to questions by shaking her head. EXTREMITIES: tenderness/edema noted to left distal thigh.   Distal pulses equal/intact in bilateral LE Pt with evidence of dialysis access to right thigh and left UE.   She has no other evidence of extremity trauma SKIN: warm, color normal   ED Course  Procedures  4:38 PM Pt presents from nursing facility for left femur fracture of unclear etiology Daughter report pt sent for evaluation Pt is currently stable/awake/alert Imaging/labs ordered 8:10 PM Pt noted to have left femur fracture Concern for possible pathologic fracture D/w dr Toniann Fail, he requests I call critical care since patient is on chronic ventilator I spoke to critical care, they report they will consult and request medicine admit I spoke to dr brooks with ortho - recommend left knee immobilizer, DVT prophylaxis as inpatient, and MR left femur tomorrow D/w dr gardner  with medicine, he recommends critical care admission D/w critical care, will see patient  Labs Review Labs Reviewed  BASIC METABOLIC PANEL - Abnormal; Notable for the following:    Sodium 132 (*)    Chloride 85 (*)    Glucose, Bld 176 (*)    BUN 157 (*)    Creatinine, Ser 4.66 (*)    GFR calc non Af Amer 9 (*)    GFR calc Af Amer 10 (*)    Anion gap 27 (*)    All other components within normal limits  CBC WITH DIFFERENTIAL - Abnormal; Notable for the following:    RBC 2.59 (*)    Hemoglobin 7.3 (*)  HCT 22.2 (*)    RDW 17.5 (*)    Neutrophils Relative % 91 (*)    Lymphocytes Relative 6 (*)    Lymphs Abs 0.5 (*)    All other components within normal limits    Imaging Review Dg Chest Portable 1 View  09/08/2014   CLINICAL DATA:  68 year old female with left lower extremity pain.  EXAM: PORTABLE CHEST - 1 VIEW  COMPARISON:  Chest x-ray 07/02/2014  FINDINGS: Significant right rotation of the patient contributes to the appearance of the cardiomediastinal silhouette. Heart size appears relatively unchanged from the comparison.  Double density overlying the left heart border and the left lung favored to represent tortuosity of the descending thoracic aorta given the right rotation. Calcifications of the aortic arch.  Patchy opacities bilateral lungs, similar to prior. Coarsening of interstitial markings.  No visualized pneumothorax.  No large confluent airspace disease.  No large pleural effusion.  No displaced fracture is identified.  A tracheostomy tube in position, similar to prior.  IMPRESSION: Limited chest x-ray demonstrates no large confluent airspace disease. The scattered patchy opacities may reflect chronic lung changes, given their presence on comparison plain film. A developing infection difficult to exclude radiographically.  Atherosclerosis and tortuous descending thoracic aorta.  Unchanged position of tracheostomy tube.  Signed,  Yvone Neu. Loreta Ave, DO  [facility]  [facility]   Electronically Signed   By: Gilmer Mor D.O.   On: 09/08/2014 18:28   Dg Femur Left Port  09/08/2014   CLINICAL DATA:  Patient with left lower extremity pain. Unsure how long. Patient is bed ridden with end-stage renal disease.  EXAM: PORTABLE LEFT FEMUR - 2 VIEW  COMPARISON:  None.  FINDINGS: Exam limited secondary to technique and patient positioning. There is a comminuted displaced fracture through the distal femoral metaphysis with lateral displacement and foreshortening of the distal fracture fragment. The there is suggestion of associated lucency through the distal aspect of femoral metaphysis. The proximal aspect of the left femur including the left hip joint, femoral neck and proximal left femoral metaphysis are inadequately evaluated.  IMPRESSION: Comminuted foreshortened displaced fracture of the distal femoral metaphysis.  There is suggestion of associated lucency through the distal aspect of the left femur near the fracture site which may be secondary to radiographic technique and the associated fracture however an underlying osseous lesion and pathologic fracture should be considered.  The proximal aspect of the left femur including the proximal left femoral metaphysis, neck and left hip joint are not adequately assessed on this evaluation. If there is concern for acute traumatic osseous injury in this location, dedicated radiographs should be performed.   Electronically Signed   By: Annia Belt M.D.   On: 09/08/2014 18:30   Dg Tibia/fibula Left Port  09/08/2014   CLINICAL DATA:  Pain lower extremity. No trauma. Initial evaluation.  EXAM: PORTABLE LEFT TIBIA AND FIBULA - 2 VIEW  COMPARISON:  None.  FINDINGS: Comminuted fracture of the distal femur is present. Lucencies are noted in the proximal tibia and in the mid and distal tibial shaft. Process such as metastatic disease and or myeloma cannot be excluded. The tibia is intact. No tibial fracture  is identified. Peripheral vascular disease.  IMPRESSION: 1. Comminuted distal femoral fracture. 2. Subtle punctate lucencies noted in the of tibia, metastatic disease and or myeloma cannot be excluded. No tibial fracture. 3. Peripheral vascular disease.   Electronically Signed   By: Maisie Fus  Register   On:  09/08/2014 18:26    MDM   Final diagnoses:  Pain  Renal failure  Fracture, femur, distal, left, closed, initial encounter  Anemia, unspecified anemia type    Nursing notes including past medical history and social history reviewed and considered in documentation Labs/vital reviewed and considered xrays reviewed and considered     Joya Gaskinsonald W [place], MD 09/08/14 2013

## 2014-09-08 NOTE — ED Notes (Signed)
Admitting Physician at the bedside.  

## 2014-09-08 NOTE — Progress Notes (Signed)
Patient was transported to 2C08 from the ER on vent with no complications.

## 2014-09-08 NOTE — Consult Note (Signed)
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  To ED 10/14 with deformity to LLE. Radiograph shows L femoral fracture. PCCM asked to see for admission.   STUDIES:  L femur X-ray 10/14 > Comminuted foreshortened displaced fracture of the distal femoral metaphysis.  SIGNIFICANT EVENTS:   HISTORY OF PRESENT ILLNESS:  68 year old female with PMH as below, which includes ESRD on HD, CVA, DM, Trach dependent, and seizures, presented to [facility] ED 10/14 with deformity to LLE. She is from [facility]. Family states that 10/13 she was noted to have some LLE edema and appeared to be in pain. 10/14 she was brought into ED for evaluation which discovered L femur fracture. PCCM has been asked to see for admission.   PAST MEDICAL HISTORY :   has a past medical history of Hypertension; ESRD (end stage renal disease); Stroke; Diabetes mellitus; Subdural hematoma; Seizure disorder; Anemia; Secondary hyperparathyroidism; Cataract; MRSA (methicillin resistant Staphylococcus aureus); C. difficile colitis; MRSA bacteremia; Central sleep apnea; and Ventricular tachycardia.  has past surgical history that includes Cholecystectomy; Vascular surgery; Thyroidectomy; Eye surgery; Craniotomy; and Thrombectomy and revision of arterioventous (av) goretex  graft (Right, 07/02/2014). Prior to Admission medications   Medication Sig Start Date End Date Taking? Authorizing Provider  acetaminophen (TYLENOL) 160 MG/5ML solution Place 650 mg into feeding tube every 6 (six) hours as needed for mild pain or fever.   Yes Historical Provider, MD  ALBUMIN, HUMAN, IV Inject 25 g into the vein 3 (three) times a week. IVPB   Yes Historical Provider, MD  Cefepime HCl (MAXIPIME IJ) Inject 500  mg as directed daily. Started on 09-07-14 give dose after dialysis. If given on dialysis days.   Yes Historical Provider, MD  chlorhexidine (PERIDEX) 0.12 % solution Place 15 mLs onto teeth every 12 (twelve) hours.    Yes Historical Provider, MD  cinacalcet (SENSIPAR) 90 MG tablet 90 mg by Feeding Tube route daily.   Yes Historical Provider, MD  darbepoetin (ARANESP) 60 MCG/0.3ML SOLN injection Inject 60 mcg into the skin every 7 (seven) days.   Yes Historical Provider, MD  dexamethasone (DECADRON) 2 MG tablet 2 mg by Feeding Tube route daily.   Yes Historical Provider, MD  ipratropium-albuterol (DUONEB) 0.5-2.5 (3) MG/3ML SOLN Take 3 mLs by nebulization every 6 (six) hours as needed (for shortness of breath).   Yes Historical Provider, MD  levETIRAcetam (KEPPRA) 100 MG/ML solution Place 250 mg into feeding tube every 12 (twelve) hours.   Yes Historical Provider, MD  midodrine (PROAMATINE) 5 MG tablet 10 mg by Feeding Tube route every 8 (eight) hours.    Yes Historical Provider, MD  modafinil (PROVIGIL) 100 MG tablet Take 100 mg by mouth daily.   Yes Historical Provider, MD  Nutritional Supplements (NOVASOURCE RENAL) LIQD 45 mL/hr by Feeding Tube route continuous.   Yes Historical Provider, MD  ondansetron (ZOFRAN) 4 MG/2ML SOLN injection Inject 4 mg into the vein every 6 (six) hours as needed for nausea or vomiting.   Yes Historical Provider, MD  polyethylene glycol (MIRALAX / GLYCOLAX) packet 17 g by Feeding Tube route daily as needed for mild constipation. As needed   Yes Historical Provider, MD  polyvinyl alcohol (LIQUIFILM TEARS) 1.4 % ophthalmic solution  Place 1 drop into both eyes every 4 (four) hours as needed for dry eyes.    Yes Historical Provider, MD  STERILE WATER PO 100 mLs by Feeding Tube route every 6 (six) hours.   Yes Historical Provider, MD  Vitamins A & D (VITAMIN A & D) ointment Apply 1 application topically every 12 (twelve) hours.   Yes Historical Provider, MD   Allergies   Allergen Reactions  . Penicillins Hives and Rash    FAMILY HISTORY:  has no family status information on file.  SOCIAL HISTORY:  reports that she quit smoking about 15 years ago. Her smoking use included Cigarettes. She has a 30 pack-year smoking history. She does not have any smokeless tobacco history on file. She reports that she does not drink alcohol or use illicit drugs.  REVIEW OF SYSTEMS:  Unable, VDRF  SUBJECTIVE:   VITAL SIGNS: Temp:  [97 F (36.1 C)-97.6 F (36.4 C)] 97 F (36.1 C) (10/15 0004) Pulse Rate:  [66-85] 70 (10/15 0004) Resp:  [14-26] 14 (10/15 0004) BP: (85-150)/(64-74) 85/67 mmHg (10/15 0004) SpO2:  [97 %-100 %] 100 % (10/15 0004) FiO2 (%):  [30 %] 30 % (10/15 0004) Weight:  [80.9 kg (178 lb 5.6 oz)] 80.9 kg (178 lb 5.6 oz) (10/14 2300) HEMODYNAMICS:   VENTILATOR SETTINGS: Vent Mode:  [-] PRVC FiO2 (%):  [30 %] 30 % Set Rate:  [14 bmp] 14 bmp Vt Set:  [500 mL] 500 mL PEEP:  [5 cmH20] 5 cmH20 Plateau Pressure:  [17 cmH20] 17 cmH20 INTAKE / OUTPUT: No intake or output data in the 24 hours ending 09/09/14 0016  PHYSICAL EXAMINATION: General:  Obese female with chronic trach in NAD Neuro:  Spontaneously awake, alert, tracks w/ eyes. No verbal HEENT:  [place]/AT, no JVD noted Cardiovascular:  RRR no m,r,g Lungs:  Resps even, unlabored. Synchronous with vent. Clear breath sound bilateral anterior chest.  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  LABS:  CBC  Recent Labs Lab 09/08/14 1653  WBC 7.6  HGB 7.3*  HCT 22.2*  PLT 191   Coag's No results found for this basename: APTT, INR,  in the last 168 hours BMET  Recent Labs Lab 09/08/14 1653  NA 132*  K 4.6  CL 85*  CO2 20  BUN 157*  CREATININE 4.66*  GLUCOSE 176*   Electrolytes  Recent Labs Lab 09/08/14 1653  CALCIUM 8.8   Sepsis Markers No results found for this basename: LATICACIDVEN, PROCALCITON, O2SATVEN,   in the last 168 hours ABG No results found for this basename: PHART, PCO2ART, PO2ART,  in the last 168 hours Liver Enzymes No results found for this basename: AST, ALT, ALKPHOS, BILITOT, ALBUMIN,  in the last 168 hours Cardiac Enzymes No results found for this basename: TROPONINI, PROBNP,  in the last 168 hours Glucose  Recent Labs Lab 09/08/14 2144  GLUCAP 75    Imaging Dg Chest Portable 1 View  09/08/2014   CLINICAL DATA:  68 year old female with left lower extremity pain.  EXAM: PORTABLE CHEST - 1 VIEW  COMPARISON:  Chest x-ray 07/02/2014  FINDINGS: Significant right rotation of the patient contributes to the appearance of the cardiomediastinal silhouette. Heart size appears relatively unchanged from the comparison.  Double density overlying the left heart border and the left lung favored to represent tortuosity of the descending thoracic aorta given the right rotation. Calcifications of the aortic arch.  Patchy opacities bilateral lungs,  similar to prior. Coarsening of interstitial markings.  No visualized pneumothorax.  No large confluent airspace disease.  No large pleural effusion.  No displaced fracture is identified.  A tracheostomy tube in position, similar to prior.  IMPRESSION: Limited chest x-ray demonstrates no large confluent airspace disease. The scattered patchy opacities may reflect chronic lung changes, given their presence on comparison plain film. A developing infection difficult to exclude radiographically.  Atherosclerosis and tortuous descending thoracic aorta.  Unchanged position of tracheostomy tube.  Signed,  Yvone NeuJaime S. Loreta AveWagner, DO  [facility]  [facility]   Electronically Signed   By: Gilmer MorJaime  Wagner D.O.   On: 09/08/2014 18:28   Dg Femur Left Port  09/08/2014   CLINICAL DATA:  Patient with left lower extremity pain. Unsure how long. Patient is bed ridden with end-stage renal disease.  EXAM: PORTABLE LEFT FEMUR - 2 VIEW   COMPARISON:  None.  FINDINGS: Exam limited secondary to technique and patient positioning. There is a comminuted displaced fracture through the distal femoral metaphysis with lateral displacement and foreshortening of the distal fracture fragment. The there is suggestion of associated lucency through the distal aspect of femoral metaphysis. The proximal aspect of the left femur including the left hip joint, femoral neck and proximal left femoral metaphysis are inadequately evaluated.  IMPRESSION: Comminuted foreshortened displaced fracture of the distal femoral metaphysis.  There is suggestion of associated lucency through the distal aspect of the left femur near the fracture site which may be secondary to radiographic technique and the associated fracture however an underlying osseous lesion and pathologic fracture should be considered.  The proximal aspect of the left femur including the proximal left femoral metaphysis, neck and left hip joint are not adequately assessed on this evaluation. If there is concern for acute traumatic osseous injury in this location, dedicated radiographs should be performed.   Electronically Signed   By: Annia Beltrew  Davis M.D.   On: 09/08/2014 18:30   Dg Tibia/fibula Left Port  09/08/2014   CLINICAL DATA:  Pain lower extremity. No trauma. Initial evaluation.  EXAM: PORTABLE LEFT TIBIA AND FIBULA - 2 VIEW  COMPARISON:  None.  FINDINGS: Comminuted fracture of the distal femur is present. Lucencies are noted in the proximal tibia and in the mid and distal tibial shaft. Process such as metastatic disease and or myeloma cannot be excluded. The tibia is intact. No tibial fracture is identified. Peripheral vascular disease.  IMPRESSION: 1. Comminuted distal femoral fracture. 2. Subtle punctate lucencies noted in the of tibia, metastatic disease and or myeloma cannot be excluded. No tibial fracture. 3. Peripheral vascular disease.   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  Check ABG VAP prevention per protocol  CARDIOVASCULAR A:  Hypotension (chronic), but was also recently on pressors after dialysis 10/13 L fem CVL 10/9 (placed after + cultures drawn) >>>  P:  Continuous cardiac monitoring Continue home midodrine MAP goal > 4765mm/Hg  RENAL A:   [facility] on HD (TTS) - family reports access is clotted off as of 10/13.  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  P:  Follow CBC Transfuse for HGB < 7 Heparin for VTE ppx  INFECTIOUS A:  MRSA and pseudomonas HCAP MRSA bacteremia, Culture data in [facility] documentation 10/8 C difficile  P:   Abx: preadmission PO vancomycin, continue Abx: preadmission IV vancomycin, start date 10/13, day 2 Abx: preadmission IV cefepime, start date 10/13, day 2 PCT algorithm Trend WBC and fever curve Will need TTE to eval for endocarditis  MUSCULOSKELETAL  A: Left femur fracture  P: Management per ortho  ENDOCRINE A:   DM Chronic steroids   P:   CBG monitoring SSI Continue preadmission dexamethasone If becomes hypotensive consider stress dose steroids.   NEUROLOGIC A:   Sequelae of remote TBI  P:   RASS goal: 0 Monitor   Family updated:    Interdisciplinary Family Meeting v Palliative Care Meeting:    TODAY'S SUMMARY:   I have personally obtained a history, examined the patient, evaluated laboratory and imaging results, formulated the assessment and plan and placed orders.   Levy Pupa, MD, PhD 09/09/2014, 12:20 AM [facility] [phone] or if no answer [phone]

## 2014-09-08 NOTE — ED Notes (Signed)
Portable chest xray being completed at this time.

## 2014-09-08 NOTE — ED Notes (Signed)
Spoke with Ortho about knee immobilizer placement.

## 2014-09-09 ENCOUNTER — Inpatient Hospital Stay (HOSPITAL_COMMUNITY): Payer: Medicare Other

## 2014-09-09 DIAGNOSIS — Z992 Dependence on renal dialysis: Secondary | ICD-10-CM | POA: Diagnosis not present

## 2014-09-09 DIAGNOSIS — Z8673 Personal history of transient ischemic attack (TIA), and cerebral infarction without residual deficits: Secondary | ICD-10-CM | POA: Diagnosis not present

## 2014-09-09 DIAGNOSIS — R7881 Bacteremia: Secondary | ICD-10-CM | POA: Diagnosis present

## 2014-09-09 DIAGNOSIS — Z823 Family history of stroke: Secondary | ICD-10-CM | POA: Diagnosis not present

## 2014-09-09 DIAGNOSIS — Z87820 Personal history of traumatic brain injury: Secondary | ICD-10-CM | POA: Diagnosis not present

## 2014-09-09 DIAGNOSIS — E119 Type 2 diabetes mellitus without complications: Secondary | ICD-10-CM | POA: Diagnosis present

## 2014-09-09 DIAGNOSIS — Z880 Allergy status to penicillin: Secondary | ICD-10-CM | POA: Diagnosis not present

## 2014-09-09 DIAGNOSIS — N2581 Secondary hyperparathyroidism of renal origin: Secondary | ICD-10-CM | POA: Diagnosis present

## 2014-09-09 DIAGNOSIS — Z833 Family history of diabetes mellitus: Secondary | ICD-10-CM | POA: Diagnosis not present

## 2014-09-09 DIAGNOSIS — J151 Pneumonia due to Pseudomonas: Secondary | ICD-10-CM | POA: Diagnosis present

## 2014-09-09 DIAGNOSIS — I120 Hypertensive chronic kidney disease with stage 5 chronic kidney disease or end stage renal disease: Secondary | ICD-10-CM | POA: Diagnosis present

## 2014-09-09 DIAGNOSIS — Z930 Tracheostomy status: Secondary | ICD-10-CM | POA: Diagnosis not present

## 2014-09-09 DIAGNOSIS — G40909 Epilepsy, unspecified, not intractable, without status epilepticus: Secondary | ICD-10-CM | POA: Diagnosis present

## 2014-09-09 DIAGNOSIS — Z7952 Long term (current) use of systemic steroids: Secondary | ICD-10-CM | POA: Diagnosis not present

## 2014-09-09 DIAGNOSIS — A047 Enterocolitis due to Clostridium difficile: Secondary | ICD-10-CM

## 2014-09-09 DIAGNOSIS — M79605 Pain in left leg: Secondary | ICD-10-CM | POA: Diagnosis present

## 2014-09-09 DIAGNOSIS — I9589 Other hypotension: Secondary | ICD-10-CM | POA: Diagnosis present

## 2014-09-09 DIAGNOSIS — Z87891 Personal history of nicotine dependence: Secondary | ICD-10-CM | POA: Diagnosis not present

## 2014-09-09 DIAGNOSIS — B9562 Methicillin resistant Staphylococcus aureus infection as the cause of diseases classified elsewhere: Secondary | ICD-10-CM | POA: Diagnosis present

## 2014-09-09 DIAGNOSIS — G4733 Obstructive sleep apnea (adult) (pediatric): Secondary | ICD-10-CM | POA: Diagnosis present

## 2014-09-09 DIAGNOSIS — I059 Rheumatic mitral valve disease, unspecified: Secondary | ICD-10-CM

## 2014-09-09 DIAGNOSIS — Y832 Surgical operation with anastomosis, bypass or graft as the cause of abnormal reaction of the patient, or of later complication, without mention of misadventure at the time of the procedure: Secondary | ICD-10-CM | POA: Diagnosis present

## 2014-09-09 DIAGNOSIS — N186 End stage renal disease: Secondary | ICD-10-CM | POA: Diagnosis present

## 2014-09-09 DIAGNOSIS — D631 Anemia in chronic kidney disease: Secondary | ICD-10-CM | POA: Diagnosis present

## 2014-09-09 DIAGNOSIS — Z9911 Dependence on respirator [ventilator] status: Secondary | ICD-10-CM | POA: Diagnosis not present

## 2014-09-09 DIAGNOSIS — Z79899 Other long term (current) drug therapy: Secondary | ICD-10-CM | POA: Diagnosis not present

## 2014-09-09 DIAGNOSIS — S728X2A Other fracture of left femur, initial encounter for closed fracture: Secondary | ICD-10-CM | POA: Diagnosis present

## 2014-09-09 DIAGNOSIS — T82898A Other specified complication of vascular prosthetic devices, implants and grafts, initial encounter: Secondary | ICD-10-CM | POA: Diagnosis present

## 2014-09-09 LAB — BLOOD GAS, ARTERIAL
ACID-BASE DEFICIT: 3 mmol/L — AB (ref 0.0–2.0)
Bicarbonate: 20.9 mEq/L (ref 20.0–24.0)
Drawn by: 39898
FIO2: 0.3 %
MECHVT: 500 mL
O2 Saturation: 98.6 %
PEEP: 5 cmH2O
PH ART: 7.41 (ref 7.350–7.450)
Patient temperature: 98.6
RATE: 14 resp/min
TCO2: 21.9 mmol/L (ref 0–100)
pCO2 arterial: 33.6 mmHg — ABNORMAL LOW (ref 35.0–45.0)
pO2, Arterial: 124 mmHg — ABNORMAL HIGH (ref 80.0–100.0)

## 2014-09-09 LAB — BASIC METABOLIC PANEL
ANION GAP: 25 — AB (ref 5–15)
BUN: 163 mg/dL — ABNORMAL HIGH (ref 6–23)
CO2: 21 meq/L (ref 19–32)
Calcium: 8.5 mg/dL (ref 8.4–10.5)
Chloride: 85 mEq/L — ABNORMAL LOW (ref 96–112)
Creatinine, Ser: 5.01 mg/dL — ABNORMAL HIGH (ref 0.50–1.10)
GFR calc Af Amer: 9 mL/min — ABNORMAL LOW (ref >90)
GFR calc non Af Amer: 8 mL/min — ABNORMAL LOW (ref >90)
GLUCOSE: 77 mg/dL (ref 70–99)
POTASSIUM: 4.7 meq/L (ref 3.7–5.3)
SODIUM: 131 meq/L — AB (ref 137–147)

## 2014-09-09 LAB — CBC
HCT: 19.7 % — ABNORMAL LOW (ref 36.0–46.0)
HCT: 21.2 % — ABNORMAL LOW (ref 36.0–46.0)
HEMOGLOBIN: 6.4 g/dL — AB (ref 12.0–15.0)
Hemoglobin: 7.3 g/dL — ABNORMAL LOW (ref 12.0–15.0)
MCH: 28.6 pg (ref 26.0–34.0)
MCH: 29.7 pg (ref 26.0–34.0)
MCHC: 32.5 g/dL (ref 30.0–36.0)
MCHC: 34.4 g/dL (ref 30.0–36.0)
MCV: 87.9 fL (ref 78.0–100.0)
MCV: 86.2 fL (ref 78.0–100.0)
PLATELETS: 169 10*3/uL (ref 150–400)
Platelets: 170 10*3/uL (ref 150–400)
RBC: 2.24 MIL/uL — AB (ref 3.87–5.11)
RBC: 2.46 MIL/uL — ABNORMAL LOW (ref 3.87–5.11)
RDW: 17.5 % — ABNORMAL HIGH (ref 11.5–15.5)
RDW: 16.6 % — ABNORMAL HIGH (ref 11.5–15.5)
WBC: 6.4 10*3/uL (ref 4.0–10.5)
WBC: 11.5 10*3/uL — ABNORMAL HIGH (ref 4.0–10.5)

## 2014-09-09 LAB — GLUCOSE, CAPILLARY
GLUCOSE-CAPILLARY: 80 mg/dL (ref 70–99)
GLUCOSE-CAPILLARY: 74 mg/dL (ref 70–99)
Glucose-Capillary: 88 mg/dL (ref 70–99)
Glucose-Capillary: 87 mg/dL (ref 70–99)
Glucose-Capillary: 90 mg/dL (ref 70–99)

## 2014-09-09 LAB — PROCALCITONIN
PROCALCITONIN: 54.68 ng/mL
PROCALCITONIN: 57.01 ng/mL

## 2014-09-09 LAB — MAGNESIUM: Magnesium: 2.3 mg/dL (ref 1.5–2.5)

## 2014-09-09 LAB — PREPARE RBC (CROSSMATCH)

## 2014-09-09 LAB — MRSA PCR SCREENING: MRSA by PCR: POSITIVE — AB

## 2014-09-09 LAB — PROTIME-INR
INR: 1.24 (ref 0.00–1.49)
Prothrombin Time: 15.7 s — ABNORMAL HIGH (ref 11.6–15.2)

## 2014-09-09 LAB — PHOSPHORUS: Phosphorus: 2.8 mg/dL (ref 2.3–4.6)

## 2014-09-09 LAB — APTT: aPTT: 31 seconds (ref 24–37)

## 2014-09-09 MED ORDER — INFLUENZA VAC SPLIT QUAD 0.5 ML IM SUSY
0.5000 mL | PREFILLED_SYRINGE | INTRAMUSCULAR | Status: AC
  Administered 2014-09-10: 0.5 mL via INTRAMUSCULAR
  Filled 2014-09-09: qty 0.5

## 2014-09-09 MED ORDER — ALTEPLASE 2 MG IJ SOLR
2.0000 mg | Freq: Once | INTRAMUSCULAR | Status: AC | PRN
Start: 2014-09-09 — End: 2014-09-09
  Filled 2014-09-09: qty 2

## 2014-09-09 MED ORDER — ALTEPLASE 100 MG IV SOLR
2.0000 mg | Freq: Once | INTRAVENOUS | Status: AC
Start: 2014-09-09 — End: 2014-09-09
  Administered 2014-09-09: 2 mg
  Filled 2014-09-09: qty 2

## 2014-09-09 MED ORDER — PRO-STAT SUGAR FREE PO LIQD
30.0000 mL | Freq: Three times a day (TID) | ORAL | Status: DC
  Administered 2014-09-09 – 2014-09-10 (×4): 30 mL
  Filled 2014-09-09 (×5): qty 30

## 2014-09-09 MED ORDER — FENTANYL CITRATE 0.05 MG/ML IJ SOLN
INTRAMUSCULAR | Status: AC
  Administered 2014-09-09: 50 ug via INTRAVENOUS
  Filled 2014-09-09: qty 2

## 2014-09-09 MED ORDER — LIDOCAINE HCL (PF) 1 % IJ SOLN: 5.0000 mL | INTRAMUSCULAR | Status: DC | PRN

## 2014-09-09 MED ORDER — LIDOCAINE-PRILOCAINE 2.5-2.5 % EX CREA
1.0000 "application " | TOPICAL_CREAM | CUTANEOUS | Status: DC | PRN
  Filled 2014-09-09: qty 5

## 2014-09-09 MED ORDER — FENTANYL CITRATE 0.05 MG/ML IJ SOLN
INTRAMUSCULAR | Status: AC | PRN
  Administered 2014-09-09 (×3): 25 ug via INTRAVENOUS

## 2014-09-09 MED ORDER — LIDOCAINE HCL 1 % IJ SOLN
INTRAMUSCULAR | Status: AC
  Filled 2014-09-09: qty 20

## 2014-09-09 MED ORDER — PENTAFLUOROPROP-TETRAFLUOROETH EX AERO: 1.0000 "application " | INHALATION_SPRAY | CUTANEOUS | Status: DC | PRN

## 2014-09-09 MED ORDER — DARBEPOETIN ALFA-POLYSORBATE 100 MCG/0.5ML IJ SOLN: 100.0000 ug | INTRAMUSCULAR | Status: DC

## 2014-09-09 MED ORDER — SODIUM CHLORIDE 0.9 % IV SOLN: 100.0000 mL | INTRAVENOUS | Status: DC | PRN

## 2014-09-09 MED ORDER — SODIUM CHLORIDE 0.9 % IV SOLN
INTRAVENOUS | Status: AC | PRN
  Administered 2014-09-09: 10 mL/h via INTRAVENOUS

## 2014-09-09 MED ORDER — CHLORHEXIDINE GLUCONATE 0.12 % MT SOLN
15.0000 mL | Freq: Two times a day (BID) | OROMUCOSAL | Status: DC
  Administered 2014-09-09 – 2014-09-10 (×3): 15 mL via OROMUCOSAL
  Filled 2014-09-09 (×5): qty 15

## 2014-09-09 MED ORDER — NEPRO/CARBSTEADY PO LIQD
1000.0000 mL | ORAL | Status: DC
  Administered 2014-09-09: 237 mL
  Administered 2014-09-10: 1000 mL
  Filled 2014-09-09 (×5): qty 1000

## 2014-09-09 MED ORDER — CETYLPYRIDINIUM CHLORIDE 0.05 % MT LIQD
7.0000 mL | Freq: Four times a day (QID) | OROMUCOSAL | Status: DC
  Administered 2014-09-09 – 2014-09-10 (×6): 7 mL via OROMUCOSAL

## 2014-09-09 MED ORDER — NEPRO/CARBSTEADY PO LIQD: 237.0000 mL | ORAL | Status: DC | PRN

## 2014-09-09 MED ORDER — HEPARIN SODIUM (PORCINE) 1000 UNIT/ML DIALYSIS
20.0000 [IU]/kg | INTRAMUSCULAR | Status: DC | PRN
  Filled 2014-09-09: qty 2

## 2014-09-09 MED ORDER — IOHEXOL 300 MG/ML  SOLN
80.0000 mL | Freq: Once | INTRAMUSCULAR | Status: AC | PRN
  Administered 2014-09-09: 1 mL via INTRAVENOUS

## 2014-09-09 MED ORDER — SODIUM CHLORIDE 0.9 % IV SOLN
Freq: Once | INTRAVENOUS | Status: DC
Start: 2014-09-09 — End: 2014-09-10

## 2014-09-09 MED ORDER — MIDAZOLAM HCL 2 MG/2ML IJ SOLN
INTRAMUSCULAR | Status: AC
  Filled 2014-09-09: qty 2

## 2014-09-09 MED ORDER — HEPARIN SODIUM (PORCINE) 1000 UNIT/ML DIALYSIS
1000.0000 [IU] | INTRAMUSCULAR | Status: DC | PRN
  Filled 2014-09-09: qty 1

## 2014-09-09 MED ORDER — SODIUM CHLORIDE 0.9 % IV SOLN
Freq: Once | INTRAVENOUS | Status: AC
  Administered 2014-09-09: 04:00:00 via INTRAVENOUS

## 2014-09-09 MED ORDER — MUPIROCIN 2 % EX OINT
1.0000 "application " | TOPICAL_OINTMENT | Freq: Two times a day (BID) | CUTANEOUS | Status: DC
  Administered 2014-09-09 – 2014-09-10 (×3): 1 via NASAL
  Filled 2014-09-09 (×2): qty 22

## 2014-09-09 MED ORDER — MIDAZOLAM HCL 2 MG/2ML IJ SOLN
INTRAMUSCULAR | Status: AC | PRN
  Administered 2014-09-09 (×3): 0.5 mg via INTRAVENOUS

## 2014-09-09 MED ORDER — HEPARIN SODIUM (PORCINE) 1000 UNIT/ML IJ SOLN
INTRAMUSCULAR | Status: AC
  Filled 2014-09-09: qty 1

## 2014-09-09 MED ORDER — CHLORHEXIDINE GLUCONATE CLOTH 2 % EX PADS
6.0000 | MEDICATED_PAD | Freq: Every day | CUTANEOUS | Status: DC
  Administered 2014-09-09 – 2014-09-10 (×2): 6 via TOPICAL

## 2014-09-09 NOTE — Progress Notes (Signed)
Echocardiogram 2D Echocardiogram has been performed.  [name] 09/09/2014, 10:18 AM

## 2014-09-09 NOTE — Progress Notes (Addendum)
INITIAL NUTRITION ASSESSMENT  DOCUMENTATION CODES Per approved criteria  -Not Applicable   INTERVENTION: Initiate Nepro at 10 ml/hr and increase by 10 ml every 4 hours to goal rate of 30 ml/hr with Prostat liquid protein 30 ml TID via tube to provide 1596 kcals, 103 gm protein, 523 ml of free water RD to follow for nutrition care plan  NUTRITION DIAGNOSIS: Inadequate oral intake related to inability to eat as evidenced by NPO status  Goal: Pt to meet >/= 90% of their estimated nutrition needs   Monitor:  TF regimen & tolerance, respiratory status, weight, labs, I/O's  Reason for Assessment: Consult  68 y.o. 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 To ED 10/14 with deformity to LLE. Radiograph shows L femoral fracture.  Patient is currently on ventilator support -- trach MV: 7.1 L/min Temp (24hrs), Avg:97.4 F (36.3 C), Min:96.5 F (35.8 C), Max:98.3 F (36.8 C)   RD unable to obtain nutrition hx from patient.  No family at bedside.  Per PTA medication list, patient receives Novasource Renal formula (Nestle product) continuously at 45 ml/hr via PEG tube which provides 2160 kcals, 98 gm protein, 774 ml of free water.  No muscle or subcutaneous fat depletion noticed.  RD consulted for TF initiation & management.  Height: Ht Readings from Last 1 Encounters:  09/08/14 5\' 9"  (1.753 m)    Weight: Wt Readings from Last 1 Encounters:  09/09/14 180 lb 5.4 oz (81.8 kg)    Ideal Body Weight: 145 lb  % Ideal Body Weight: 124%  Wt Readings from Last 10 Encounters:  09/09/14 180 lb 5.4 oz (81.8 kg)  07/04/14 181 lb (82.1 kg)  07/04/14 181 lb (82.1 kg)    Usual Body Weight: 181 lb  % Usual Body Weight: 99%  BMI:  Body mass index is 26.62 kg/(m^2).  Estimated Nutritional Needs: Kcal: 1500-1650 Protein: 100-110 gm Fluid: per MD  Skin: Intact  Diet Order: NPO  EDUCATION  NEEDS: -No education needs identified at this time   Intake/Output Summary (Last 24 hours) at 09/09/14 1045 Last data filed at 09/09/14 1030  Gross per 24 hour  Intake    730 ml  Output      0 ml  Net    730 ml    Labs:   Recent Labs Lab 09/08/14 1653 09/09/14 0228  NA 132* 131*  K 4.6 4.7  CL 85* 85*  CO2 20 21  BUN 157* 163*  CREATININE 4.66* 5.01*  CALCIUM 8.8 8.5  MG  --  2.3  PHOS  --  2.8  GLUCOSE 176* 77    CBG (last 3)   Recent Labs  09/09/14 0001 09/09/14 0415 09/09/14 0803  GLUCAP 90 88 87    Scheduled Meds: . antiseptic oral rinse  7 mL Mouth Rinse QID  . ceFEPime (MAXIPIME) IV  2 g Intravenous Q T,Th,Sat-1800  . chlorhexidine  15 mL Mouth Rinse BID  . Chlorhexidine Gluconate Cloth  6 each Topical Q0600  . cinacalcet  90 mg Oral Q breakfast  . dexamethasone  2 mg Per Tube Daily  . feeding supplement (VITAL HIGH PROTEIN)  1,000 mL Per Tube Q24H  . free water  100 mL Per Tube Q6H  . heparin  5,000 Units Subcutaneous 3 times per day  . insulin aspart  2-6 Units Subcutaneous 6 times per day  . levETIRAcetam  250 mg Per Tube Q12H  .  midodrine  10 mg Per Tube TID WC  . modafinil  100 mg Oral Daily  . mupirocin ointment  1 application Nasal BID  . pantoprazole (PROTONIX) IV  40 mg Intravenous QHS  . vancomycin  125 mg Per Tube 4 times per day  . vancomycin  1,000 mg Intravenous Q T,Th,Sa-HD  . vitamin A & D  1 application Topical Q12H    Continuous Infusions:   Past Medical History  Diagnosis Date  . Hypertension   . ESRD (end stage renal disease)   . Stroke   . Diabetes mellitus   . Subdural hematoma   . Seizure disorder   . Anemia   . Secondary hyperparathyroidism   . Cataract   . MRSA (methicillin resistant Staphylococcus aureus)   . C. difficile colitis   . MRSA bacteremia   . Central sleep apnea   . Ventricular tachycardia     Past Surgical History  Procedure Laterality Date  . Cholecystectomy    . Vascular surgery    .  Thyroidectomy    . Eye surgery    . Craniotomy    . Thrombectomy and revision of arterioventous (av) goretex  graft Right 07/02/2014    Procedure: THROMBECTOMY AND REVISION OF VENOUS SIDE OF ARTERIOVENTOUS (AV) GORETEX  GRAFT-RIGHT THIGH;  Surgeon: Chuck Hinthristopher S Dickson, MD;  [facility];  Service: Vascular;  Laterality: Right;    Maureen ChattersKatie Elizar Alpern, RD, LDN Pager #: [phone] After-Hours Pager #: [phone]

## 2014-09-09 NOTE — Consult Note (Signed)
[facility] Renal Consultation Note    Indication for Consultation:  Management of ESRD/hemodialysis; anemia, hypertension/volume and secondary hyperparathyroidism PCP:  HPI: [name] is a 68 y.o. AA  female with ESRD on HD who has resided at [facility] since July 2014 following a SDH also with a hx DM, CVA, trach dependent, sz brought to the ED yesterday because family noted LLE edema and pt appeared to be in pain. Evaluation in the ED showed left femur fracture.  She had currently been receiving IV Vanc, po Vanc and cefepime for MSSA, pseudomonas PNA and C Diff.  In the ED she was noted to have a BUN of 160 and hgb 7.3 repeat 6.4.  Her dialysis graft was found to be clotted.  Records suggested there have been problems with cannulation, "breakthrough" bleeding and that Dr. Rayna Sexton had been talking with [facility] about doing a revision.  She had an August 2015 admission for cardiac arrest due to ABLA from right thigh graft  At which time she had replacement of the arterial side by Dr. Hart Rochester Review if recent [facility] notes show LFTs have been increasing lately and most recent Hgb was 8.6  Past Medical History  Diagnosis Date  . Hypertension   . ESRD (end stage renal disease)   . Stroke   . Diabetes mellitus   . Subdural hematoma   . Seizure disorder   . Anemia   . Secondary hyperparathyroidism   . Cataract   . MRSA (methicillin resistant Staphylococcus aureus)   . C. difficile colitis   . MRSA bacteremia   . Central sleep apnea   . Ventricular tachycardia    Past Surgical History  Procedure Laterality Date  . Cholecystectomy    . Vascular surgery    . Thyroidectomy    . Eye surgery    . Craniotomy    . Thrombectomy and revision of arterioventous (av) goretex  graft Right 07/02/2014    Procedure: THROMBECTOMY AND REVISION OF VENOUS SIDE OF ARTERIOVENTOUS (AV) GORETEX  GRAFT-RIGHT THIGH;  Surgeon: Chuck Hint, MD;  Location: [facility] OR;  Service:  Vascular;  Laterality: Right;   Family History  Problem Relation Age of Onset  . Hypertension Mother   . Diabetes Mellitus II Mother   . Stroke Father   . Hypertension Father    Social History:  reports that she quit smoking about 15 years ago. Her smoking use included Cigarettes. She has a 30 pack-year smoking history. She does not have any smokeless tobacco history on file. She reports that she does not drink alcohol or use illicit drugs. Allergies  Allergen Reactions  . Penicillins Hives and Rash   Prior to Admission medications   Medication Sig Start Date End Date Taking? Authorizing Provider  acetaminophen (TYLENOL) 160 MG/5ML solution Place 650 mg into feeding tube every 6 (six) hours as needed for mild pain or fever.   Yes Historical Provider, MD  ALBUMIN, HUMAN, IV Inject 25 g into the vein 3 (three) times a week. IVPB   Yes Historical Provider, MD  Cefepime HCl (MAXIPIME IJ) Inject 500 mg as directed daily. Started on 09-07-14 give dose after dialysis. If given on dialysis days.   Yes Historical Provider, MD  chlorhexidine (PERIDEX) 0.12 % solution Place 15 mLs onto teeth every 12 (twelve) hours.    Yes Historical Provider, MD  cinacalcet (SENSIPAR) 90 MG tablet 90 mg by Feeding Tube route daily.   Yes Historical Provider, MD  darbepoetin (ARANESP) 60 MCG/0.3ML  SOLN injection Inject 60 mcg into the skin every 7 (seven) days.   Yes Historical Provider, MD  dexamethasone (DECADRON) 2 MG tablet 2 mg by Feeding Tube route daily.   Yes Historical Provider, MD  ipratropium-albuterol (DUONEB) 0.5-2.5 (3) MG/3ML SOLN Take 3 mLs by nebulization every 6 (six) hours as needed (for shortness of breath).   Yes Historical Provider, MD  levETIRAcetam (KEPPRA) 100 MG/ML solution Place 250 mg into feeding tube every 12 (twelve) hours.   Yes Historical Provider, MD  midodrine (PROAMATINE) 5 MG tablet 10 mg by Feeding Tube route every 8 (eight) hours.    Yes Historical Provider, MD  modafinil  (PROVIGIL) 100 MG tablet Take 100 mg by mouth daily.   Yes Historical Provider, MD  Nutritional Supplements (NOVASOURCE RENAL) LIQD 45 mL/hr by Feeding Tube route continuous.   Yes Historical Provider, MD  ondansetron (ZOFRAN) 4 MG/2ML SOLN injection Inject 4 mg into the vein every 6 (six) hours as needed for nausea or vomiting.   Yes Historical Provider, MD  polyethylene glycol (MIRALAX / GLYCOLAX) packet 17 g by Feeding Tube route daily as needed for mild constipation. As needed   Yes Historical Provider, MD  polyvinyl alcohol (LIQUIFILM TEARS) 1.4 % ophthalmic solution Place 1 drop into both eyes every 4 (four) hours as needed for dry eyes.    Yes Historical Provider, MD  STERILE WATER PO 100 mLs by Feeding Tube route every 6 (six) hours.   Yes Historical Provider, MD  Vitamins A & D (VITAMIN A & D) ointment Apply 1 application topically every 12 (twelve) hours.   Yes Historical Provider, MD   Current Facility-Administered Medications  Medication Dose Route Frequency Provider Last Rate Last Dose  . 0.9 %  sodium chloride infusion  250 mL Intravenous PRN Tyrone Nine, MD      . acetaminophen (TYLENOL) solution 650 mg  650 mg Per Tube Q6H PRN Tyrone Nine, MD      . alteplase (ACTIVASE) injection 2 mg  2 mg Intracatheter Once Reola Calkins, MD      . antiseptic oral rinse (CPC / CETYLPYRIDINIUM CHLORIDE 0.05%) solution 7 mL  7 mL Mouth Rinse QID Leslye Peer, MD   7 mL at 09/09/14 1331  . ceFEPIme (MAXIPIME) 2 g in dextrose 5 % 50 mL IVPB  2 g Intravenous Q T,Th,Sat-1800 Marisue Brooklyn      . chlorhexidine (PERIDEX) 0.12 % solution 15 mL  15 mL Mouth Rinse BID Leslye Peer, MD   15 mL at 09/09/14 0931  . Chlorhexidine Gluconate Cloth 2 % PADS 6 each  6 each Topical Q0600 Leslye Peer, MD   6 each at 09/09/14 0515  . cinacalcet (SENSIPAR) tablet 90 mg  90 mg Oral Q breakfast Tyrone Nine, MD   90 mg at 09/09/14 0932  . dexamethasone (DECADRON) tablet 2 mg  2 mg Per Tube Daily Tyrone Nine,  MD   2 mg at 09/09/14 1042  . feeding supplement (NEPRO CARB STEADY) liquid 1,000 mL  1,000 mL Per Tube Q24H Ailene Ards, RD      . feeding supplement (PRO-STAT SUGAR FREE 64) liquid 30 mL  30 mL Per Tube TID Ailene Ards, RD      . fentaNYL (SUBLIMAZE) injection 25-100 mcg  25-100 mcg Intravenous Q2H PRN Duayne Cal, NP      . free water 100 mL  100 mL Per Tube Q6H Tyrone Nine, MD  100 mL at 09/09/14 1102  . heparin injection 5,000 Units  5,000 Units Subcutaneous 3 times per day Tyrone Nineyan B Grunz, MD   5,000 Units at 09/09/14 0537  . insulin aspart (novoLOG) injection 2-6 Units  2-6 Units Subcutaneous 6 times per day Duayne CalPaul W Hoffman, NP      . ipratropium-albuterol (DUONEB) 0.5-2.5 (3) MG/3ML nebulizer solution 3 mL  3 mL Nebulization Q6H PRN Tyrone Nineyan B Grunz, MD      . levETIRAcetam (KEPPRA) 100 MG/ML solution 250 mg  250 mg Per Tube Q12H Tyrone Nineyan B Grunz, MD   250 mg at 09/09/14 0934  . midodrine (PROAMATINE) tablet 10 mg  10 mg Per Tube TID WC Tyrone Nineyan B Grunz, MD   10 mg at 09/09/14 1101  . modafinil (PROVIGIL) tablet 100 mg  100 mg Oral Daily Tyrone Nineyan B Grunz, MD      . mupirocin ointment (BACTROBAN) 2 % 1 application  1 application Nasal BID Leslye Peerobert S Byrum, MD   1 application at 09/09/14 [phone]  . ondansetron (ZOFRAN) injection 4 mg  4 mg Intravenous Q6H PRN Tyrone Nineyan B Grunz, MD      . pantoprazole (PROTONIX) injection 40 mg  40 mg Intravenous QHS Tyrone Nineyan B Grunz, MD   40 mg at 09/09/14 0119  . polyethylene glycol (MIRALAX / GLYCOLAX) packet 17 g  17 g Per Tube Daily PRN Tyrone Nineyan B Grunz, MD      . polyvinyl alcohol (LIQUIFILM TEARS) 1.4 % ophthalmic solution 1 drop  1 drop Both Eyes Q4H PRN Tyrone Nineyan B Grunz, MD      . vancomycin (VANCOCIN) 50 mg/mL oral solution 125 mg  125 mg Per Tube 4 times per day Tyrone Nineyan B Grunz, MD   125 mg at 09/09/14 1345  . vancomycin (VANCOCIN) IVPB 1000 mg/200 mL premix  1,000 mg Intravenous Q T,Th,Sa-HD Marisue BrooklynNicholas Gazda      . vitamin A & D ointment 1 application  1 application  Topical Q12H Tyrone Nineyan B Grunz, MD   1 application at 09/09/14 [id number]   Labs: Basic Metabolic Panel:  Recent Labs Lab 09/08/14 1653 09/09/14 0228  NA 132* 131*  K 4.6 4.7  CL 85* 85*  CO2 20 21  GLUCOSE 176* 77  BUN 157* 163*  CREATININE 4.66* 5.01*  CALCIUM 8.8 8.5  PHOS  --  2.8   CBC:  Recent Labs Lab 09/08/14 1653 09/09/14 0228  WBC 7.6 6.4  NEUTROABS 6.9  --   HGB 7.3* 6.4*  HCT 22.2* 19.7*  MCV 85.7 87.9  PLT 191 169  CBG:  Recent Labs Lab 09/08/14 2144 09/09/14 0001 09/09/14 0415 09/09/14 0803  GLUCAP 75 90 88 87   Studies/Results: Dg Chest Port 1 View  09/09/2014   CLINICAL DATA:  68 year old female with chronic respiratory failure. History of MRSA pneumonia. Shortness of breath.  EXAM: PORTABLE CHEST - 1 VIEW  COMPARISON:  Chest x-ray 09/08/2014.  FINDINGS: Lung volumes are slightly low. No consolidative airspace disease. No pleural effusions. No evidence of pulmonary edema. Mild cardiomegaly. The patient is rotated to the left on today's exam, resulting in distortion of the mediastinal contours and reduced diagnostic sensitivity and specificity for mediastinal pathology. Atherosclerosis in the thoracic aorta. Tracheostomy tube in position with tip approximately 5.4 cm above the carina.  IMPRESSION: 1. Low lung volumes without radiographic evidence of acute cardiopulmonary disease. 2. Atherosclerosis. 3. Cardiomegaly.   Electronically Signed   By: Trudie Reedaniel  Entrikin M.D.   On: 09/09/2014 07:35   Dg Chest Portable  1 View  09/08/2014   CLINICAL DATA:  68 year old female with left lower extremity pain.  EXAM: PORTABLE CHEST - 1 VIEW  COMPARISON:  Chest x-ray 07/02/2014  FINDINGS: Significant right rotation of the patient contributes to the appearance of the cardiomediastinal silhouette. Heart size appears relatively unchanged from the comparison.  Double density overlying the left heart border and the left lung favored to represent tortuosity of the descending thoracic  aorta given the right rotation. Calcifications of the aortic arch.  Patchy opacities bilateral lungs, similar to prior. Coarsening of interstitial markings.  No visualized pneumothorax.  No large confluent airspace disease.  No large pleural effusion.  No displaced fracture is identified.  A tracheostomy tube in position, similar to prior.  IMPRESSION: Limited chest x-ray demonstrates no large confluent airspace disease. The scattered patchy opacities may reflect chronic lung changes, given their presence on comparison plain film. A developing infection difficult to exclude radiographically.  Atherosclerosis and tortuous descending thoracic aorta.  Unchanged position of tracheostomy tube.  Signed,  Yvone Neu. Loreta Ave, DO  [facility]  [facility]   Electronically Signed   By: Gilmer Mor D.O.   On: 09/08/2014 18:28   Dg Femur Left Port  09/08/2014   CLINICAL DATA:  Patient with left lower extremity pain. Unsure how long. Patient is bed ridden with end-stage renal disease.  EXAM: PORTABLE LEFT FEMUR - 2 VIEW  COMPARISON:  None.  FINDINGS: Exam limited secondary to technique and patient positioning. There is a comminuted displaced fracture through the distal femoral metaphysis with lateral displacement and foreshortening of the distal fracture fragment. The there is suggestion of associated lucency through the distal aspect of femoral metaphysis. The proximal aspect of the left femur including the left hip joint, femoral neck and proximal left femoral metaphysis are inadequately evaluated.  IMPRESSION: Comminuted foreshortened displaced fracture of the distal femoral metaphysis.  There is suggestion of associated lucency through the distal aspect of the left femur near the fracture site which may be secondary to radiographic technique and the associated fracture however an underlying osseous lesion and pathologic fracture should be considered.  The proximal aspect of  the left femur including the proximal left femoral metaphysis, neck and left hip joint are not adequately assessed on this evaluation. If there is concern for acute traumatic osseous injury in this location, dedicated radiographs should be performed.   Electronically Signed   By: Annia Belt M.D.   On: 09/08/2014 18:30   Dg Tibia/fibula Left Port  09/08/2014   CLINICAL DATA:  Pain lower extremity. No trauma. Initial evaluation.  EXAM: PORTABLE LEFT TIBIA AND FIBULA - 2 VIEW  COMPARISON:  None.  FINDINGS: Comminuted fracture of the distal femur is present. Lucencies are noted in the proximal tibia and in the mid and distal tibial shaft. Process such as metastatic disease and or myeloma cannot be excluded. The tibia is intact. No tibial fracture is identified. Peripheral vascular disease.  IMPRESSION: 1. Comminuted distal femoral fracture. 2. Subtle punctate lucencies noted in the of tibia, metastatic disease and or myeloma cannot be excluded. No tibial fracture. 3. Peripheral vascular disease.   Electronically Signed   By: Maisie Fus  Register   On: 09/08/2014 18:26    ROS:  Unable to obtain - pt nonverbal  Physical Exam: Filed Vitals:   09/09/14 0943 09/09/14 1027 09/09/14 1147 09/09/14 1323  BP:  125/57 116/71   Pulse:  68 65 63  Temp: 97.6 F (36.4 C) 96.5 F (  35.8 C)  97 F (36.1 C)  TempSrc: Axillary Axillary  Axillary  Resp:  14 14   Height:      Weight:      SpO2: 100% 100% 100%      General: obese, cushingoid, Head: Normocephalic, atraumatic, sclera non-icteric, mucus membranes are moist, eyes open Neck: Supple. Neck thick with trach Lungs: grossly clear Heart: RRR  Abdomen: Soft, non-tender, non-distended ; trple lumen in left groin Lower extremities:  1 + LE edema, muscle wasting; Leg in immobilizer,  Neuro: eyes open - nonverbal Psych:  Responds to questions appropriately with a normal affect. Dialysis Access: right thigh graft -lateral side with 3 dressings in place with dried  blood (lateral side only)  Dialysis Orders:   [facility] - no dialysis information available - on Aranesp 60 last given 10/12; most recent Hgb 8.6  Assessment/Plan: 1. Clotted right thigh AVGG - per [facility] attending notes, it has been documented by primary that Dr. Rayna SextonIgwemezie [facility]; from exam, it appears only the lateral side has been being cannulated; dressing still on from Monday. 2. ID comes from [facility] wtih - current treatment for MSSA bacteremia, pseudomonas PNA, c diff colitis - Vano IV and po and Maxipime 3. ESRD -  MWF last HD Monday BUN 160 ? GIB because Cr not proportionately high; had thrombectomy and revision 07/02/14- has been having some issues with bleeding; BUN could be high with recirculation - but would expect higher Cr; also high protein in diet could contribute if underdialyzed; will see if IR can declot; due to high BUN will do serial HD to avoid disequilibrium 4. Hypertension/volume  - BP ok, but generalized volume excess; on midodrine for BP support 5. Anemia  - Hgb 6.4 - suspect acute blood loss - last Hgb from [facility] 8.6 - transfusing prn - check hemocult- continue weekly Aranesp ^ to 100 6. Metabolic bone disease -  Not on VDRA - is on sensipar; last P low at [facility] 1.8 - 2.8 here 7. Nutrition - on PEG feedings 8. ^ LFTs - would try to decrease volume and follow - review of notes from [facility] show that they have been increasing lately. 9. VDRF - with trach per CCM 10. Complex distal femur fx - conservative tx with immobilizer per ortho  Sheffield SliderMartha B Sabina Beavers, PA-C [facility] Beeper [phone] 09/09/2014, 1:45 PM

## 2014-09-09 NOTE — Consult Note (Signed)
Chief Complaint: Chief Complaint  Patient presents with  . Leg Injury  dialysis access- Rt thigh graft- clotted  Referring Physician(s): Dr Kathrene Bongo  History of Present Illness: [name] is a 68 y.o. 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 ESRD Hx CVA; DM; Sz Admitted to [facility] yesterday with L femur fx Has been seen by Dr Kathrene Bongo regarding clotted Rt thigh dialysis graft Request has been made for IR consult for thrombolysis of same Last use of graft 10/12---no issue per Dtr Has used this graft successfully for many months/yr Tried to access 10/14 and clotted Last intervention in IR 08/16/14- shuntogram with angioplasty same graft I do not see previous declots recorded Dr Fredia Sorrow has reviewed chart I have seen and examined pt Now scheduled for Rt thigh dialysis graft thrombolysis with possible angioplasty/stent placement Possible dialysis catheter placement if necessary  Past Medical History  Diagnosis Date  . Hypertension   . ESRD (end stage renal disease)   . Stroke   . Diabetes mellitus   . Subdural hematoma   . Seizure disorder   . Anemia   . Secondary hyperparathyroidism   . Cataract   . MRSA (methicillin resistant Staphylococcus aureus)   . C. difficile colitis   . MRSA bacteremia   . Central sleep apnea   . Ventricular tachycardia     Past Surgical History  Procedure Laterality Date  . Cholecystectomy    . Vascular surgery    . Thyroidectomy    . Eye surgery    . Craniotomy    . Thrombectomy and revision of arterioventous (av) goretex  graft Right 07/02/2014    Procedure: THROMBECTOMY AND REVISION OF VENOUS SIDE OF ARTERIOVENTOUS (AV) GORETEX  GRAFT-RIGHT THIGH;  Surgeon: Chuck Hint, MD;  Location: [facility] OR;  Service: Vascular;  Laterality: Right;    Allergies: Penicillins  Medications: Prior to Admission medications   Medication Sig Start Date End Date Taking? Authorizing Provider  acetaminophen (TYLENOL)  160 MG/5ML solution Place 650 mg into feeding tube every 6 (six) hours as needed for mild pain or fever.   Yes Historical Provider, MD  ALBUMIN, HUMAN, IV Inject 25 g into the vein 3 (three) times a week. IVPB   Yes Historical Provider, MD  Cefepime HCl (MAXIPIME IJ) Inject 500 mg as directed daily. Started on 09-07-14 give dose after dialysis. If given on dialysis days.   Yes Historical Provider, MD  chlorhexidine (PERIDEX) 0.12 % solution Place 15 mLs onto teeth every 12 (twelve) hours.    Yes Historical Provider, MD  cinacalcet (SENSIPAR) 90 MG tablet 90 mg by Feeding Tube route daily.   Yes Historical Provider, MD  darbepoetin (ARANESP) 60 MCG/0.3ML SOLN injection Inject 60 mcg into the skin every 7 (seven) days.   Yes Historical Provider, MD  dexamethasone (DECADRON) 2 MG tablet 2 mg by Feeding Tube route daily.   Yes Historical Provider, MD  ipratropium-albuterol (DUONEB) 0.5-2.5 (3) MG/3ML SOLN Take 3 mLs by nebulization every 6 (six) hours as needed (for shortness of breath).   Yes Historical Provider, MD  levETIRAcetam (KEPPRA) 100 MG/ML solution Place 250 mg into feeding tube every 12 (twelve) hours.   Yes Historical Provider, MD  midodrine (PROAMATINE) 5 MG tablet 10 mg by Feeding Tube route every 8 (eight) hours.    Yes Historical Provider, MD  modafinil (PROVIGIL) 100 MG tablet Take 100 mg by mouth daily.   Yes Historical Provider, MD  Nutritional Supplements (NOVASOURCE RENAL) LIQD 45  mL/hr by Feeding Tube route continuous.   Yes Historical Provider, MD  ondansetron (ZOFRAN) 4 MG/2ML SOLN injection Inject 4 mg into the vein every 6 (six) hours as needed for nausea or vomiting.   Yes Historical Provider, MD  polyethylene glycol (MIRALAX / GLYCOLAX) packet 17 g by Feeding Tube route daily as needed for mild constipation. As needed   Yes Historical Provider, MD  polyvinyl alcohol (LIQUIFILM TEARS) 1.4 % ophthalmic solution Place 1 drop into both eyes every 4 (four) hours as needed for dry  eyes.    Yes Historical Provider, MD  STERILE WATER PO 100 mLs by Feeding Tube route every 6 (six) hours.   Yes Historical Provider, MD  Vitamins A & D (VITAMIN A & D) ointment Apply 1 application topically every 12 (twelve) hours.   Yes Historical Provider, MD    Family History  Problem Relation Age of Onset  . Hypertension Mother   . Diabetes Mellitus II Mother   . Stroke Father   . Hypertension Father     History   Social History  . Marital Status: Single    Spouse Name: N/A    Number of Children: N/A  . Years of Education: N/A   Social History Main Topics  . Smoking status: Former Smoker -- 1.50 packs/day for 20 years    Types: Cigarettes    Quit date: 11/26/1998  . Smokeless tobacco: Not on file  . Alcohol Use: No  . Drug Use: No  . Sexual Activity: Not on file   Other Topics Concern  . Not on file   Social History Narrative  . No narrative on file    Review of Systems: A 12 point ROS discussed and pertinent positives are indicated in the HPI above.  All other systems are negative.  Review of Systems  Constitutional: Positive for activity change and fatigue.  Respiratory: Positive for shortness of breath and wheezing.     Vital Signs: BP 125/57  Pulse 68  Temp(Src) 96.5 F (35.8 C) (Axillary)  Resp 14  Ht 5\' 9"  (1.753 m)  Wt 81.8 kg (180 lb 5.4 oz)  BMI 26.62 kg/m2  SpO2 100%  Physical Exam  Constitutional: She appears well-nourished.  Cardiovascular: Normal rate and regular rhythm.   Pulmonary/Chest: Effort normal. She has wheezes.  trach  Abdominal: Soft. Bowel sounds are normal. There is no tenderness.  Musculoskeletal:  Very groggy Tries to follow commands  Neurological:  Not alert or oriented enough to answer questions  Skin: Skin is warm and dry.  Psychiatric:  Dtr in room; consented with her (POA)    Imaging: Ir Pta Venous Right  08/16/2014   CLINICAL DATA:  68 year old female with end-stage renal disease on hemodialysis via a right  thigh arteriovenous loop graft. She presents for fistulogram due to difficulty with cannulation and prolonged bleeding following decannulation.  EXAM: IR RIGHT SHUNTOGRAM/FISTULAGRAM; PTA VENOUS  Date: 08/16/2014  PROCEDURE: 1. Diagnostic shuntogram 2. Angioplasty of saphenous femoral junction 3. Completion shuntogram Interventional Radiologist:  Sterling Big, MD  ANESTHESIA/SEDATION: None required  FLUOROSCOPY TIME:  48 seconds  CONTRAST:  40mL OMNIPAQUE IOHEXOL 300 MG/ML  SOLN  TECHNIQUE: Informed consent was obtained from the patient following explanation of the procedure, risks, benefits and alternatives. The patient understands, agrees and consents for the procedure. All questions were addressed. A time out was performed.  Maximal barrier sterile technique utilized including caps, mask, sterile gowns, sterile gloves, large sterile drape, hand hygiene, and Betadine skin prep.  The arterial limb of the loop graft was punctured in antegrade fashion using an 18 gauge angiocatheter. A Diagnostic shuntogram was performed. The arterial anastomosis is widely patent as is the loop graft. The venous anastomosis appears patent. There is focal narrowing of approximately 40% at the saphenous femoral junction. Given the patient's clinical symptoms, the decision was made to proceed with angioplasty in an effort to decreased venous back pressure and prevent prolonged bleeding following decannulation.  The 18 gauge angiocatheter was exchanged over a Bentson wire for a working 5 [place] vascular sheath. A mustang 5 x 40 mm balloon was advanced across the saphenous femoral junction and angioplasty was performed. The balloon was inflated to 15 atmospheres with full effacement and held for 2 min. Post angioplasty contrast injection demonstrates excellent flow without residual stenosis. The wire was removed. The sheath was then removed and hemostasis attained with the assistance of a 2 0 nylon pursestring suture. The patient  tolerated the procedure well.  COMPLICATIONS: None immediate  IMPRESSION: 1. Diagnostic shuntogram demonstrates focal stenosis at the saphenofemoral junction. 2. Successful percutaneous angioplasty to 5 mm with excellent angiographic result. 3. Right thigh loop arteriovenous graft remains amenable to continued percutaneous intervention. Signed,  Sterling BigHeath K. McCullough, MD  [facility]  [facility]   Electronically Signed   By: Malachy MoanHeath  McCullough M.D.   On: 08/16/2014 16:04   Dg Chest Port 1 View  09/09/2014   CLINICAL DATA:  68 year old female with chronic respiratory failure. History of MRSA pneumonia. Shortness of breath.  EXAM: PORTABLE CHEST - 1 VIEW  COMPARISON:  Chest x-ray 09/08/2014.  FINDINGS: Lung volumes are slightly low. No consolidative airspace disease. No pleural effusions. No evidence of pulmonary edema. Mild cardiomegaly. The patient is rotated to the left on today's exam, resulting in distortion of the mediastinal contours and reduced diagnostic sensitivity and specificity for mediastinal pathology. Atherosclerosis in the thoracic aorta. Tracheostomy tube in position with tip approximately 5.4 cm above the carina.  IMPRESSION: 1. Low lung volumes without radiographic evidence of acute cardiopulmonary disease. 2. Atherosclerosis. 3. Cardiomegaly.   Electronically Signed   By: Trudie Reedaniel  Entrikin M.D.   On: 09/09/2014 07:35   Dg Chest Portable 1 View  09/08/2014   CLINICAL DATA:  68 year old female with left lower extremity pain.  EXAM: PORTABLE CHEST - 1 VIEW  COMPARISON:  Chest x-ray 07/02/2014  FINDINGS: Significant right rotation of the patient contributes to the appearance of the cardiomediastinal silhouette. Heart size appears relatively unchanged from the comparison.  Double density overlying the left heart border and the left lung favored to represent tortuosity of the descending thoracic aorta given the right rotation. Calcifications of the  aortic arch.  Patchy opacities bilateral lungs, similar to prior. Coarsening of interstitial markings.  No visualized pneumothorax.  No large confluent airspace disease.  No large pleural effusion.  No displaced fracture is identified.  A tracheostomy tube in position, similar to prior.  IMPRESSION: Limited chest x-ray demonstrates no large confluent airspace disease. The scattered patchy opacities may reflect chronic lung changes, given their presence on comparison plain film. A developing infection difficult to exclude radiographically.  Atherosclerosis and tortuous descending thoracic aorta.  Unchanged position of tracheostomy tube.  Signed,  Yvone NeuJaime S. Loreta AveWagner, DO  [facility]  [facility]   Electronically Signed   By: Gilmer MorJaime  Wagner D.O.   On: 09/08/2014 18:28   Dg Femur Left Port  09/08/2014   CLINICAL DATA:  Patient with left  lower extremity pain. Unsure how long. Patient is bed ridden with end-stage renal disease.  EXAM: PORTABLE LEFT FEMUR - 2 VIEW  COMPARISON:  None.  FINDINGS: Exam limited secondary to technique and patient positioning. There is a comminuted displaced fracture through the distal femoral metaphysis with lateral displacement and foreshortening of the distal fracture fragment. The there is suggestion of associated lucency through the distal aspect of femoral metaphysis. The proximal aspect of the left femur including the left hip joint, femoral neck and proximal left femoral metaphysis are inadequately evaluated.  IMPRESSION: Comminuted foreshortened displaced fracture of the distal femoral metaphysis.  There is suggestion of associated lucency through the distal aspect of the left femur near the fracture site which may be secondary to radiographic technique and the associated fracture however an underlying osseous lesion and pathologic fracture should be considered.  The proximal aspect of the left femur including the proximal left femoral  metaphysis, neck and left hip joint are not adequately assessed on this evaluation. If there is concern for acute traumatic osseous injury in this location, dedicated radiographs should be performed.   Electronically Signed   By: Annia Belt M.D.   On: 09/08/2014 18:30   Dg Tibia/fibula Left Port  09/08/2014   CLINICAL DATA:  Pain lower extremity. No trauma. Initial evaluation.  EXAM: PORTABLE LEFT TIBIA AND FIBULA - 2 VIEW  COMPARISON:  None.  FINDINGS: Comminuted fracture of the distal femur is present. Lucencies are noted in the proximal tibia and in the mid and distal tibial shaft. Process such as metastatic disease and or myeloma cannot be excluded. The tibia is intact. No tibial fracture is identified. Peripheral vascular disease.  IMPRESSION: 1. Comminuted distal femoral fracture. 2. Subtle punctate lucencies noted in the of tibia, metastatic disease and or myeloma cannot be excluded. No tibial fracture. 3. Peripheral vascular disease.   Electronically Signed   By: Maisie Fus  Register   On: 09/08/2014 18:26   Ir Shuntogram/fistulagram Right  08/16/2014   CLINICAL DATA:  68 year old female with end-stage renal disease on hemodialysis via a right thigh arteriovenous loop graft. She presents for fistulogram due to difficulty with cannulation and prolonged bleeding following decannulation.  EXAM: IR RIGHT SHUNTOGRAM/FISTULAGRAM; PTA VENOUS  Date: 08/16/2014  PROCEDURE: 1. Diagnostic shuntogram 2. Angioplasty of saphenous femoral junction 3. Completion shuntogram Interventional Radiologist:  Sterling Big, MD  ANESTHESIA/SEDATION: None required  FLUOROSCOPY TIME:  48 seconds  CONTRAST:  40mL OMNIPAQUE IOHEXOL 300 MG/ML  SOLN  TECHNIQUE: Informed consent was obtained from the patient following explanation of the procedure, risks, benefits and alternatives. The patient understands, agrees and consents for the procedure. All questions were addressed. A time out was performed.  Maximal barrier sterile  technique utilized including caps, mask, sterile gowns, sterile gloves, large sterile drape, hand hygiene, and Betadine skin prep.  The arterial limb of the loop graft was punctured in antegrade fashion using an 18 gauge angiocatheter. A Diagnostic shuntogram was performed. The arterial anastomosis is widely patent as is the loop graft. The venous anastomosis appears patent. There is focal narrowing of approximately 40% at the saphenous femoral junction. Given the patient's clinical symptoms, the decision was made to proceed with angioplasty in an effort to decreased venous back pressure and prevent prolonged bleeding following decannulation.  The 18 gauge angiocatheter was exchanged over a Bentson wire for a working 5 [place] vascular sheath. A mustang 5 x 40 mm balloon was advanced across the saphenous femoral junction and angioplasty was performed. The balloon  was inflated to 15 atmospheres with full effacement and held for 2 min. Post angioplasty contrast injection demonstrates excellent flow without residual stenosis. The wire was removed. The sheath was then removed and hemostasis attained with the assistance of a 2 0 nylon pursestring suture. The patient tolerated the procedure well.  COMPLICATIONS: None immediate  IMPRESSION: 1. Diagnostic shuntogram demonstrates focal stenosis at the saphenofemoral junction. 2. Successful percutaneous angioplasty to 5 mm with excellent angiographic result. 3. Right thigh loop arteriovenous graft remains amenable to continued percutaneous intervention. Signed,  Sterling BigHeath K. McCullough, MD  [facility]  [facility]   Electronically Signed   By: Malachy MoanHeath  McCullough M.D.   On: 08/16/2014 16:04    Labs:  CBC:  Recent Labs  07/03/14 0400 07/04/14 0437 09/08/14 1653 09/09/14 0228  WBC 6.0 4.8 7.6 6.4  HGB 9.3* 9.0* 7.3* 6.4*  HCT 28.4* 27.8* 22.2* 19.7*  PLT 122* 125* 191 169    COAGS:  Recent Labs  07/01/14 1430  09/08/14 2359  INR 1.18 1.24  APTT 38* 31    BMP:  Recent Labs  07/03/14 0400 07/04/14 0437 09/08/14 1653 09/09/14 0228  NA 137 142 132* 131*  K 3.9 3.8 4.6 4.7  CL 98 102 85* 85*  CO2 24 28 20 21   GLUCOSE 124* 89 176* 77  BUN 49* 30* 157* 163*  CALCIUM 7.9* 8.7 8.8 8.5  CREATININE 3.59* 2.40* 4.66* 5.01*  GFRNONAA 12* 20* 9* 8*  GFRAA 14* 23* 10* 9*    LIVER FUNCTION TESTS:  Recent Labs  06/26/14 2300 06/29/14 1114 07/01/14 0443 07/02/14 0300 07/03/14 0400  BILITOT 0.4 0.3  --   --   --   AST 14 31  --   --   --   ALT 13 27  --   --   --   ALKPHOS 899* 779*  --   --   --   PROT 7.4 5.2*  --   --   --   ALBUMIN 3.8 2.7* 2.4* 2.8* 2.8*    TUMOR MARKERS: No results found for this basename: AFPTM, CEA, CA199, CHROMGRNA,  in the last 8760 hours  Assessment and Plan:  Clotted Rt thigh dialysis graft Last IR intervention 08/16/14--successful pta Now with L femur fx and admitted to [facility] Rt thigh graft used successfully 09/06/14 dtr aware of procedure benefits and risks and agreeable to proceed Consent signed andin chart  Thank you for this interesting consult.  I greatly enjoyed meeting Michelene HeadySandra A Kassabian and look forward to participating in their care.    I spent a total of 30 minutes face to face in clinical consultation, greater than 50% of which was counseling/coordinating care for Rt thigh dialysis graft thrombolysis with poss pta/sten. Poss dialysis catheter placement  Signed: Jadah Bobak A 09/09/2014, 11:39 AM

## 2014-09-09 NOTE — Progress Notes (Signed)
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  To ED 10/14 with deformity to LLE. Radiograph shows L femoral fracture. PCCM asked to see for admission.   STUDIES:  L femur X-ray 10/14 > Comminuted foreshortened displaced fracture of the distal femoral metaphysis.  SIGNIFICANT EVENTS:   HISTORY OF PRESENT ILLNESS:  68 year old female with PMH as below, which includes ESRD on HD, CVA, DM, Trach dependent, and seizures, presented to [facility] ED 10/14 with deformity to LLE. She is from [facility]. Family states that 10/13 she was noted to have some LLE edema and appeared to be in pain. 10/14 she was brought into ED for evaluation which discovered L femur fracture. PCCM has been asked to see for admission.   SUBJECTIVE: Does not appear to be i pain afebrile  VITAL SIGNS: Temp:  [96.5 F (35.8 C)-98.3 F (36.8 C)] 96.5 F (35.8 C) (10/15 1027) Pulse Rate:  [66-85] 68 (10/15 1027) Resp:  [11-26] 14 (10/15 1027) BP: (85-150)/(55-74) 125/57 mmHg (10/15 1027) SpO2:  [97 %-100 %] 100 % (10/15 1027) FiO2 (%):  [30 %] 30 % (10/15 0915) Weight:  [80.9 kg (178 lb 5.6 oz)-81.8 kg (180 lb 5.4 oz)] 81.8 kg (180 lb 5.4 oz) (10/15 0413) HEMODYNAMICS:   VENTILATOR SETTINGS: Vent Mode:  [-] PRVC FiO2 (%):  [30 %] 30 % Set Rate:  [14 bmp] 14 bmp Vt Set:  [500 mL] 500 mL PEEP:  [5 cmH20] 5 cmH20 Plateau Pressure:  [16 cmH20-17 cmH20] 16 cmH20 INTAKE / OUTPUT:  Intake/Output Summary (Last 24 hours) at 09/09/14 1035 Last data filed at 09/09/14 0544  Gross per 24 hour  Intake    700 ml  Output      0 ml  Net    700 ml    PHYSICAL EXAMINATION: General:  Obese female with chronic trach in NAD Neuro:  Spontaneously awake, alert, tracks w/ eyes.  No verbal HEENT:  [place]/AT, no JVD noted Cardiovascular:  RRR no m,r,g Lungs:  Resps even, unlabored. Synchronous with vent. Clear breath sound bilateral anterior chest.  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  LABS:  CBC  Recent Labs Lab 09/08/14 1653 09/09/14 0228  WBC 7.6 6.4  HGB 7.3* 6.4*  HCT 22.2* 19.7*  PLT 191 169   Coag's  Recent Labs Lab 09/08/14 2359  APTT 31  INR 1.24   BMET  Recent Labs Lab 09/08/14 1653 09/09/14 0228  NA 132* 131*  K 4.6 4.7  CL 85* 85*  CO2 20 21  BUN 157* 163*  CREATININE 4.66* 5.01*  GLUCOSE 176* 77   Electrolytes  Recent Labs Lab 09/08/14 1653 09/09/14 0228  CALCIUM 8.8 8.5  MG  --  2.3  PHOS  --  2.8   Sepsis Markers  Recent Labs Lab 09/08/14 2329 09/09/14 0228  PROCALCITON 54.68 57.01   ABG  Recent Labs Lab 09/08/14 2359  PHART 7.410  PCO2ART 33.6*  PO2ART 124.0*   Liver Enzymes No results found for this basename: AST, ALT, ALKPHOS, BILITOT, ALBUMIN,  in the last 168 hours Cardiac Enzymes No results found for this basename: TROPONINI, PROBNP,  in the last 168 hours  Glucose  Recent Labs Lab 09/08/14 2144 09/09/14 0001 09/09/14 0415 09/09/14 0803  GLUCAP 75 90 88 87    Imaging Dg Chest Portable 1 View  09/08/2014   CLINICAL DATA:  68 year old female with left lower extremity pain.  EXAM: PORTABLE CHEST - 1 VIEW  COMPARISON:  Chest x-ray 07/02/2014  FINDINGS: Significant right rotation of the patient contributes to the appearance of the cardiomediastinal silhouette. Heart size appears relatively unchanged from the comparison.  Double density overlying the left heart border and the left lung favored to represent tortuosity of the descending thoracic aorta given the right rotation. Calcifications of the aortic arch.  Patchy opacities bilateral lungs, similar to prior. Coarsening of interstitial markings.  No visualized pneumothorax.   No large confluent airspace disease.  No large pleural effusion.  No displaced fracture is identified.  A tracheostomy tube in position, similar to prior.  IMPRESSION: Limited chest x-ray demonstrates no large confluent airspace disease. The scattered patchy opacities may reflect chronic lung changes, given their presence on comparison plain film. A developing infection difficult to exclude radiographically.  Atherosclerosis and tortuous descending thoracic aorta.  Unchanged position of tracheostomy tube.  Signed,  Yvone NeuJaime S. Loreta AveWagner, DO  [facility]  [facility]   Electronically Signed   By: Gilmer MorJaime  Wagner D.O.   On: 09/08/2014 18:28   Dg Femur Left Port  09/08/2014   CLINICAL DATA:  Patient with left lower extremity pain. Unsure how long. Patient is bed ridden with end-stage renal disease.  EXAM: PORTABLE LEFT FEMUR - 2 VIEW  COMPARISON:  None.  FINDINGS: Exam limited secondary to technique and patient positioning. There is a comminuted displaced fracture through the distal femoral metaphysis with lateral displacement and foreshortening of the distal fracture fragment. The there is suggestion of associated lucency through the distal aspect of femoral metaphysis. The proximal aspect of the left femur including the left hip joint, femoral neck and proximal left femoral metaphysis are inadequately evaluated.  IMPRESSION: Comminuted foreshortened displaced fracture of the distal femoral metaphysis.  There is suggestion of associated lucency through the distal aspect of the left femur near the fracture site which may be secondary to radiographic technique and the associated fracture however an underlying osseous lesion and pathologic fracture should be considered.  The proximal aspect of the left femur including the proximal left femoral metaphysis, neck and left hip joint are not adequately assessed on this evaluation. If there is concern for acute traumatic osseous  injury in this location, dedicated radiographs should be performed.   Electronically Signed   By: Annia Beltrew  Davis M.D.   On: 09/08/2014 18:30   Dg Tibia/fibula Left Port  09/08/2014   CLINICAL DATA:  Pain lower extremity. No trauma. Initial evaluation.  EXAM: PORTABLE LEFT TIBIA AND FIBULA - 2 VIEW  COMPARISON:  None.  FINDINGS: Comminuted fracture of the distal femur is present. Lucencies are noted in the proximal tibia and in the mid and distal tibial shaft. Process such as metastatic disease and or myeloma cannot be excluded. The tibia is intact. No tibial fracture is identified. Peripheral vascular disease.  IMPRESSION: 1. Comminuted distal femoral fracture. 2. Subtle punctate lucencies noted in the of tibia, metastatic disease and or myeloma cannot be excluded. No tibial fracture. 3. Peripheral vascular disease.   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 -apneic on SBT attempts VAP prevention per protocol  CARDIOVASCULAR A:  Hypotension (chronic), but was also recently on pressors after dialysis 10/13 L fem CVL 10/9 (placed after + cultures drawn) >>>  P:  Continuous cardiac monitoring Continue home midodrine MAP goal > 7965mm/Hg  RENAL A:   [facility]RD on HD (TTS) - family reports access is clotted off as of 10/13.  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 reason If becomes hypotensive consider stress dose steroids.   NEUROLOGIC A:   Sequelae of remote TBI  P:   RASS goal: 0 Monitor   Family updated:    Interdisciplinary Family Meeting v Palliative Care Meeting:    TODAY'S SUMMARY:  Conservative management of femur fracture - Dr Shon BatonBrooks called in ED, May need new access -defer to renal  I have personally obtained a history, examined the patient, evaluated laboratory and imaging results, formulated the assessment and plan and placed orders.   Cyril Mourningakesh Alva MD. Tonny BollmanFCCP. [place] [facility] Pager [phone] If no response call [phone]   09/09/2014, 10:35 AM

## 2014-09-09 NOTE — Progress Notes (Signed)
eLink Physician-Brief Progress Note Patient Name: Christina HeadySandra A [name] DOB: December 09, 1945 MRN: [id number]   Date of Service  09/09/2014  HPI/Events of Note  Hgb 6.4  eICU Interventions  One unit PRBCs ordered     Intervention Category Major Interventions: Other:  Billy FischerDavid Simonds 09/09/2014, 3:21 AM

## 2014-09-09 NOTE — Progress Notes (Signed)
Utilization review completed. Epifania Littrell, RN, [name]. 

## 2014-09-09 NOTE — Procedures (Signed)
Procedure:  Right thigh dialysis graft declot with angioplasty Findings:  Occluded right thigh AVGG.  Venous anastamotic stenosis treated with 7 mm angioplasty.  Arterial anastamotic stenosis treated with 6 mm angioplasty.  Good angiographic result with patent flow on completion.

## 2014-09-09 NOTE — Sedation Documentation (Signed)
Patient denies pain and is resting comfortably.  

## 2014-09-09 NOTE — Consult Note (Signed)
I have seen and examined this patient and agree with the plan of care. End stage renal disease with acute care needing transfer from long term care facility to tertiary center. Clotted access will need to be addressed prior to dialysis treatments [name] W 09/09/2014, 8:44 PM

## 2014-09-09 NOTE — Progress Notes (Signed)
CRITICAL VALUE ALERT  Critical value received:  MRSA positive nasal swab  Date of notification:  09/09/2014   Time of notification:  0355  Critical value read back: yes  Nurse who received alert:  Cresenciano LickMikaela Ingram   MD notified (1st page):   Time of first page:     MD notified (2nd page):  Time of second page:  Responding MD:    Time MD responded:

## 2014-09-09 NOTE — Progress Notes (Signed)
CRITICAL VALUE ALERT  Critical value received:  Hemoglobin 6.4  Date of notification:  09/09/2014   Time of notification:  3:14 AM   Critical value read back: yes  Nurse who received alert:  Cresenciano LickMikaela Ingram RN  MD notified (1st page):  3:15 AM   Time of first page:  09/09/2014   MD notified (2nd page):   Time of second page:  Responding MD:  Dr. Sung AmabileSimonds  Time MD responded:  [id number]

## 2014-09-09 NOTE — Consult Note (Signed)
Patient ID: [name] MRN: [id number] DOB/AGE: 08-06-1946 68 y.o.  Admit date: 09/08/2014  Admission Diagnoses:  Active Problems:   Femur fracture   HPI: Patient being seen for comminuted left distal femur fracture.  On Vent     Past Medical History: Past Medical History  Diagnosis Date  . Hypertension   . ESRD (end stage renal disease)   . Stroke   . Diabetes mellitus   . Subdural hematoma   . Seizure disorder   . Anemia   . Secondary hyperparathyroidism   . Cataract   . MRSA (methicillin resistant Staphylococcus aureus)   . C. difficile colitis   . MRSA bacteremia   . Central sleep apnea   . Ventricular tachycardia     Surgical History: Past Surgical History  Procedure Laterality Date  . Cholecystectomy    . Vascular surgery    . Thyroidectomy    . Eye surgery    . Craniotomy    . Thrombectomy and revision of arterioventous (av) goretex  graft Right 07/02/2014    Procedure: THROMBECTOMY AND REVISION OF VENOUS SIDE OF ARTERIOVENTOUS (AV) GORETEX  GRAFT-RIGHT THIGH;  Surgeon: Chuck Hinthristopher S Dickson, MD;  Location: [facility] OR;  Service: Vascular;  Laterality: Right;    Family History: Family History  Problem Relation Age of Onset  . Hypertension Mother   . Diabetes Mellitus II Mother   . Stroke Father   . Hypertension Father     Social History: History   Social History  . Marital Status: Single    Spouse Name: N/A    Number of Children: N/A  . Years of Education: N/A   Occupational History  . Not on file.   Social History Main Topics  . Smoking status: Former Smoker -- 1.50 packs/day for 20 years    Types: Cigarettes    Quit date: 11/26/1998  . Smokeless tobacco: Not on file  . Alcohol Use: No  . Drug Use: No  . Sexual Activity: Not on file   Other Topics Concern  . Not on file   Social History Narrative  . No narrative on file    Allergies: Penicillins  Medications: I have reviewed the patient's current medications.  Vital  Signs: Patient Vitals for the past 24 hrs:  BP Temp Temp src Pulse Resp SpO2 Height Weight  09/09/14 1147 116/71 mmHg - - 65 14 100 % - -  09/09/14 1027 125/57 mmHg 96.5 F (35.8 C) Axillary 68 14 100 % - -  09/09/14 0943 - 97.6 F (36.4 C) Axillary - - 100 % - -  09/09/14 0805 114/55 mmHg 97.2 F (36.2 C) Axillary 71 11 100 % - -  09/09/14 0413 - - - - - - - 81.8 kg (180 lb 5.4 oz)  09/09/14 0400 121/66 mmHg 98.3 F (36.8 C) Axillary 69 14 100 % - -  09/09/14 0320 - - - 69 14 99 % - -  09/09/14 0115 92/62 mmHg - - 72 17 100 % - -  09/09/14 0004 85/67 mmHg 97 F (36.1 C) Axillary 70 14 100 % - -  09/08/14 2326 - - - 71 21 100 % - -  09/08/14 2316 91/72 mmHg - - 72 14 100 % - -  09/08/14 2300 - 97.6 F (36.4 C) Axillary - - - 5\' 9"  (1.753 m) 80.9 kg (178 lb 5.6 oz)  09/08/14 2145 114/69 mmHg - - 71 14 100 % - -  09/08/14 2122 - - -  72 15 100 % - -  09/08/14 2100 107/70 mmHg - - 69 15 100 % - -  09/08/14 2030 115/74 mmHg - - 68 14 100 % - -  09/08/14 2019 114/70 mmHg - - 78 26 100 % - -  09/08/14 1945 98/65 mmHg - - - 14 - - -  09/08/14 1915 103/69 mmHg - - 66 14 100 % - -  09/08/14 1845 100/68 mmHg - - - 15 - - -  09/08/14 1815 107/73 mmHg - - 73 16 99 % - -  09/08/14 1800 107/71 mmHg - - 75 14 99 % - -  09/08/14 1745 115/67 mmHg - - 77 15 97 % - -  09/08/14 1730 115/70 mmHg - - 78 20 99 % - -  09/08/14 1715 104/64 mmHg - - 78 18 99 % - -  09/08/14 1700 101/65 mmHg - - 78 14 99 % - -  09/08/14 1657 150/70 mmHg - - 80 22 99 % - -  09/08/14 1645 105/70 mmHg - - 81 19 98 % - -  09/08/14 1630 116/74 mmHg - - 85 19 97 % - -  09/08/14 1615 113/72 mmHg - - 81 24 100 % - -  09/08/14 1611 - - - - 24 - - -    Radiology: Ir Pta Venous Right  08/16/2014   CLINICAL DATA:  68 year old female with end-stage renal disease on hemodialysis via a right thigh arteriovenous loop graft. She presents for fistulogram due to difficulty with cannulation and prolonged bleeding following  decannulation.  EXAM: IR RIGHT SHUNTOGRAM/FISTULAGRAM; PTA VENOUS  Date: 08/16/2014  PROCEDURE: 1. Diagnostic shuntogram 2. Angioplasty of saphenous femoral junction 3. Completion shuntogram Interventional Radiologist:  Sterling Big, MD  ANESTHESIA/SEDATION: None required  FLUOROSCOPY TIME:  48 seconds  CONTRAST:  40mL OMNIPAQUE IOHEXOL 300 MG/ML  SOLN  TECHNIQUE: Informed consent was obtained from the patient following explanation of the procedure, risks, benefits and alternatives. The patient understands, agrees and consents for the procedure. All questions were addressed. A time out was performed.  Maximal barrier sterile technique utilized including caps, mask, sterile gowns, sterile gloves, large sterile drape, hand hygiene, and Betadine skin prep.  The arterial limb of the loop graft was punctured in antegrade fashion using an 18 gauge angiocatheter. A Diagnostic shuntogram was performed. The arterial anastomosis is widely patent as is the loop graft. The venous anastomosis appears patent. There is focal narrowing of approximately 40% at the saphenous femoral junction. Given the patient's clinical symptoms, the decision was made to proceed with angioplasty in an effort to decreased venous back pressure and prevent prolonged bleeding following decannulation.  The 18 gauge angiocatheter was exchanged over a Bentson wire for a working 5 [place] vascular sheath. A mustang 5 x 40 mm balloon was advanced across the saphenous femoral junction and angioplasty was performed. The balloon was inflated to 15 atmospheres with full effacement and held for 2 min. Post angioplasty contrast injection demonstrates excellent flow without residual stenosis. The wire was removed. The sheath was then removed and hemostasis attained with the assistance of a 2 0 nylon pursestring suture. The patient tolerated the procedure well.  COMPLICATIONS: None immediate  IMPRESSION: 1. Diagnostic shuntogram demonstrates focal stenosis at  the saphenofemoral junction. 2. Successful percutaneous angioplasty to 5 mm with excellent angiographic result. 3. Right thigh loop arteriovenous graft remains amenable to continued percutaneous intervention. Signed,  Sterling Big, MD  [facility]  [facility]  Electronically Signed   By: Malachy Moan M.D.   On: 08/16/2014 16:04   Dg Chest Port 1 View  09/09/2014   CLINICAL DATA:  68 year old female with chronic respiratory failure. History of MRSA pneumonia. Shortness of breath.  EXAM: PORTABLE CHEST - 1 VIEW  COMPARISON:  Chest x-ray 09/08/2014.  FINDINGS: Lung volumes are slightly low. No consolidative airspace disease. No pleural effusions. No evidence of pulmonary edema. Mild cardiomegaly. The patient is rotated to the left on today's exam, resulting in distortion of the mediastinal contours and reduced diagnostic sensitivity and specificity for mediastinal pathology. Atherosclerosis in the thoracic aorta. Tracheostomy tube in position with tip approximately 5.4 cm above the carina.  IMPRESSION: 1. Low lung volumes without radiographic evidence of acute cardiopulmonary disease. 2. Atherosclerosis. 3. Cardiomegaly.   Electronically Signed   By: Trudie Reed M.D.   On: 09/09/2014 07:35   Dg Chest Portable 1 View  09/08/2014   CLINICAL DATA:  68 year old female with left lower extremity pain.  EXAM: PORTABLE CHEST - 1 VIEW  COMPARISON:  Chest x-ray 07/02/2014  FINDINGS: Significant right rotation of the patient contributes to the appearance of the cardiomediastinal silhouette. Heart size appears relatively unchanged from the comparison.  Double density overlying the left heart border and the left lung favored to represent tortuosity of the descending thoracic aorta given the right rotation. Calcifications of the aortic arch.  Patchy opacities bilateral lungs, similar to prior. Coarsening of interstitial markings.  No visualized pneumothorax.   No large confluent airspace disease.  No large pleural effusion.  No displaced fracture is identified.  A tracheostomy tube in position, similar to prior.  IMPRESSION: Limited chest x-ray demonstrates no large confluent airspace disease. The scattered patchy opacities may reflect chronic lung changes, given their presence on comparison plain film. A developing infection difficult to exclude radiographically.  Atherosclerosis and tortuous descending thoracic aorta.  Unchanged position of tracheostomy tube.  Signed,  Yvone Neu. Loreta Ave, DO  [facility]  [facility]   Electronically Signed   By: Gilmer Mor D.O.   On: 09/08/2014 18:28   Dg Femur Left Port  09/08/2014   CLINICAL DATA:  Patient with left lower extremity pain. Unsure how long. Patient is bed ridden with end-stage renal disease.  EXAM: PORTABLE LEFT FEMUR - 2 VIEW  COMPARISON:  None.  FINDINGS: Exam limited secondary to technique and patient positioning. There is a comminuted displaced fracture through the distal femoral metaphysis with lateral displacement and foreshortening of the distal fracture fragment. The there is suggestion of associated lucency through the distal aspect of femoral metaphysis. The proximal aspect of the left femur including the left hip joint, femoral neck and proximal left femoral metaphysis are inadequately evaluated.  IMPRESSION: Comminuted foreshortened displaced fracture of the distal femoral metaphysis.  There is suggestion of associated lucency through the distal aspect of the left femur near the fracture site which may be secondary to radiographic technique and the associated fracture however an underlying osseous lesion and pathologic fracture should be considered.  The proximal aspect of the left femur including the proximal left femoral metaphysis, neck and left hip joint are not adequately assessed on this evaluation. If there is concern for acute traumatic osseous  injury in this location, dedicated radiographs should be performed.   Electronically Signed   By: Annia Belt M.D.   On: 09/08/2014 18:30   Dg Tibia/fibula Left Port  09/08/2014   CLINICAL DATA:  Pain lower extremity. No trauma.  Initial evaluation.  EXAM: PORTABLE LEFT TIBIA AND FIBULA - 2 VIEW  COMPARISON:  None.  FINDINGS: Comminuted fracture of the distal femur is present. Lucencies are noted in the proximal tibia and in the mid and distal tibial shaft. Process such as metastatic disease and or myeloma cannot be excluded. The tibia is intact. No tibial fracture is identified. Peripheral vascular disease.  IMPRESSION: 1. Comminuted distal femoral fracture. 2. Subtle punctate lucencies noted in the of tibia, metastatic disease and or myeloma cannot be excluded. No tibial fracture. 3. Peripheral vascular disease.   Electronically Signed   By: Maisie Fus  Register   On: 09/08/2014 18:26   Ir Shuntogram/fistulagram Right  08/16/2014   CLINICAL DATA:  68 year old female with end-stage renal disease on hemodialysis via a right thigh arteriovenous loop graft. She presents for fistulogram due to difficulty with cannulation and prolonged bleeding following decannulation.  EXAM: IR RIGHT SHUNTOGRAM/FISTULAGRAM; PTA VENOUS  Date: 08/16/2014  PROCEDURE: 1. Diagnostic shuntogram 2. Angioplasty of saphenous femoral junction 3. Completion shuntogram Interventional Radiologist:  Sterling Big, MD  ANESTHESIA/SEDATION: None required  FLUOROSCOPY TIME:  48 seconds  CONTRAST:  40mL OMNIPAQUE IOHEXOL 300 MG/ML  SOLN  TECHNIQUE: Informed consent was obtained from the patient following explanation of the procedure, risks, benefits and alternatives. The patient understands, agrees and consents for the procedure. All questions were addressed. A time out was performed.  Maximal barrier sterile technique utilized including caps, mask, sterile gowns, sterile gloves, large sterile drape, hand hygiene, and Betadine skin prep.  The  arterial limb of the loop graft was punctured in antegrade fashion using an 18 gauge angiocatheter. A Diagnostic shuntogram was performed. The arterial anastomosis is widely patent as is the loop graft. The venous anastomosis appears patent. There is focal narrowing of approximately 40% at the saphenous femoral junction. Given the patient's clinical symptoms, the decision was made to proceed with angioplasty in an effort to decreased venous back pressure and prevent prolonged bleeding following decannulation.  The 18 gauge angiocatheter was exchanged over a Bentson wire for a working 5 [place] vascular sheath. A mustang 5 x 40 mm balloon was advanced across the saphenous femoral junction and angioplasty was performed. The balloon was inflated to 15 atmospheres with full effacement and held for 2 min. Post angioplasty contrast injection demonstrates excellent flow without residual stenosis. The wire was removed. The sheath was then removed and hemostasis attained with the assistance of a 2 0 nylon pursestring suture. The patient tolerated the procedure well.  COMPLICATIONS: None immediate  IMPRESSION: 1. Diagnostic shuntogram demonstrates focal stenosis at the saphenofemoral junction. 2. Successful percutaneous angioplasty to 5 mm with excellent angiographic result. 3. Right thigh loop arteriovenous graft remains amenable to continued percutaneous intervention. Signed,  Sterling Big, MD  [facility]  [facility]   Electronically Signed   By: Malachy Moan M.D.   On: 08/16/2014 16:04    Labs:  Recent Labs  09/08/14 1653 09/09/14 0228  WBC 7.6 6.4  RBC 2.59* 2.24*  HCT 22.2* 19.7*  PLT 191 169    Recent Labs  09/08/14 1653 09/09/14 0228  NA 132* 131*  K 4.6 4.7  CL 85* 85*  CO2 20 21  BUN 157* 163*  CREATININE 4.66* 5.01*  GLUCOSE 176* 77  CALCIUM 8.8 8.5    Recent Labs  09/08/14 2359  INR 1.24    Review of Systems: Review of  Systems  Unable to perform ROS: intubated    Physical Exam:  Left LE knee immobilizer on.    Assessment and Plan: Comminuted left distal femur fracture.  Possibly pathologic.  Patient is not a good operative candidate.  Will need immobilizer on at all times.  Will be available if any issues.  Reviewed with my attending Dr Venita Lickahari Brooks.    Venita Lickahari Brooks, MD [facility] [phone]

## 2014-09-09 NOTE — Plan of Care (Signed)
Problem: Consults Goal: Nutrition Consult-if indicated Outcome: Completed/Met Date Met:  09/09/14 Order for dietician in to see pt

## 2014-09-09 NOTE — Consult Note (Signed)
Agree.  For right thigh AVGG declot procedure today.

## 2014-09-09 NOTE — Consult Note (Signed)
Agree with above Patient with multiple medical issue Patient is non-communicative, nonambulatory vent dependent Complex distal femur fracture Unlikely that surgical intervention would improve quality of life.  Given medical issue the complication rate for surgical intervention is significantly higher. Would recommend knee immobilizer and conservative treatment DVT prevention per medical service Will sign-off   Please call if questions arise

## 2014-09-10 DIAGNOSIS — T8386XD Thrombosis of genitourinary prosthetic devices, implants and grafts, subsequent encounter: Secondary | ICD-10-CM

## 2014-09-10 DIAGNOSIS — S7292XK Unspecified fracture of left femur, subsequent encounter for closed fracture with nonunion: Secondary | ICD-10-CM

## 2014-09-10 DIAGNOSIS — T82898A Other specified complication of vascular prosthetic devices, implants and grafts, initial encounter: Secondary | ICD-10-CM | POA: Diagnosis not present

## 2014-09-10 LAB — RENAL FUNCTION PANEL
ALBUMIN: 2.8 g/dL — AB (ref 3.5–5.2)
Anion gap: 21 — ABNORMAL HIGH (ref 5–15)
BUN: 99 mg/dL — ABNORMAL HIGH (ref 6–23)
CALCIUM: 8.1 mg/dL — AB (ref 8.4–10.5)
CO2: 22 mEq/L (ref 19–32)
CREATININE: 3.81 mg/dL — AB (ref 0.50–1.10)
Chloride: 90 mEq/L — ABNORMAL LOW (ref 96–112)
GFR calc Af Amer: 13 mL/min — ABNORMAL LOW (ref >90)
GFR, EST NON AFRICAN AMERICAN: 11 mL/min — AB (ref >90)
Glucose, Bld: 97 mg/dL (ref 70–99)
PHOSPHORUS: 3.9 mg/dL (ref 2.3–4.6)
Potassium: 5.5 mEq/L — ABNORMAL HIGH (ref 3.7–5.3)
Sodium: 133 mEq/L — ABNORMAL LOW (ref 137–147)

## 2014-09-10 LAB — CBC
HCT: 20.7 % — ABNORMAL LOW (ref 36.0–46.0)
Hemoglobin: 6.8 g/dL — CL (ref 12.0–15.0)
MCH: 28.8 pg (ref 26.0–34.0)
MCHC: 32.9 g/dL (ref 30.0–36.0)
MCV: 87.7 fL (ref 78.0–100.0)
PLATELETS: 150 10*3/uL (ref 150–400)
RBC: 2.36 MIL/uL — ABNORMAL LOW (ref 3.87–5.11)
RDW: 16.6 % — AB (ref 11.5–15.5)
WBC: 6.8 10*3/uL (ref 4.0–10.5)

## 2014-09-10 LAB — GLUCOSE, CAPILLARY
GLUCOSE-CAPILLARY: 119 mg/dL — AB (ref 70–99)
GLUCOSE-CAPILLARY: 95 mg/dL (ref 70–99)
Glucose-Capillary: 112 mg/dL — ABNORMAL HIGH (ref 70–99)
Glucose-Capillary: 129 mg/dL — ABNORMAL HIGH (ref 70–99)
Glucose-Capillary: 104 mg/dL — ABNORMAL HIGH (ref 70–99)

## 2014-09-10 LAB — HEPATITIS B SURFACE ANTIGEN: HEP B S AG: NEGATIVE

## 2014-09-10 LAB — PROCALCITONIN: PROCALCITONIN: 40.91 ng/mL

## 2014-09-10 LAB — PREPARE RBC (CROSSMATCH)

## 2014-09-10 MED ORDER — VANCOMYCIN HCL IN DEXTROSE 1-5 GM/200ML-% IV SOLN: 1000.0000 mg | INTRAVENOUS | Status: DC

## 2014-09-10 MED ORDER — SODIUM CHLORIDE 0.9 % IV SOLN: 250.0000 mL | INTRAVENOUS | Status: AC | PRN

## 2014-09-10 MED ORDER — ALBUMIN HUMAN 25 % IV SOLN
25.0000 g | Freq: Once | INTRAVENOUS | Status: AC
  Administered 2014-09-10: 25 g via INTRAVENOUS

## 2014-09-10 MED ORDER — VANCOMYCIN 50 MG/ML ORAL SOLUTION: 125.0000 mg | Freq: Four times a day (QID) | ORAL | Status: DC

## 2014-09-10 MED ORDER — ALBUMIN HUMAN 25 % IV SOLN
INTRAVENOUS | Status: AC
  Administered 2014-09-10: 12.5 g via INTRAVENOUS
  Filled 2014-09-10: qty 150

## 2014-09-10 MED ORDER — SODIUM CHLORIDE 0.9 % IV SOLN: Freq: Once | INTRAVENOUS | Status: DC

## 2014-09-10 MED ORDER — PNEUMOCOCCAL VAC POLYVALENT 25 MCG/0.5ML IJ INJ: 0.5000 mL | INJECTION | INTRAMUSCULAR | Status: DC

## 2014-09-10 MED ORDER — ALBUMIN HUMAN 25 % IV SOLN
25.0000 g | Freq: Once | INTRAVENOUS | Status: AC
  Administered 2014-09-10: 12.5 g via INTRAVENOUS

## 2014-09-10 NOTE — Progress Notes (Signed)
Subjective:  Thigh AVG declotted followed by HD- alert this AM Objective Vital signs in last 24 hours: Filed Vitals:   09/10/14 0100 09/10/14 0311 09/10/14 0406 09/10/14 0500  BP: 111/77  129/77   Pulse: 81 80 80   Temp: 99.6 F (37.6 C)  99.4 F (37.4 C)   TempSrc: Axillary  Axillary   Resp: 19 14 23    Height:      Weight:    84.5 kg (186 lb 4.6 oz)  SpO2: 98% 98% 99%    Weight change: 2.6 kg (5 lb 11.7 oz)  Intake/Output Summary (Last 24 hours) at 09/10/14 0807 Last data filed at 09/09/14 2124  Gross per 24 hour  Intake    950 ml  Output    -56 ml  Net   1006 ml    Assessment/Plan:  1. Clotted right thigh AVGG - per [facility] attending notes, it has been documented by primary that Dr. Rayna Sexton has been talking to [facility] about access revision; from exam, it appears only the lateral side has been being cannulated;  - IR here- their help is appreciated s/p angioplasty of venous and arterial anastamosis- is patent this AM 2. ID comes from [facility] with - current treatment for MSSA bacteremia, pseudomonas PNA, c diff colitis - Vano IV and po and Maxipime 3. ESRD - MWF last HD Monday BUN 160 ? GIB because Cr not proportionately high; had thrombectomy and revision 07/02/14- has been having some issues with bleeding; BUN could be high with recirculation - but would expect higher Cr; also high protein in diet could contribute if underdialyzed; will see if IR can declot; due to high BUN will do serial HD to avoid disequilibrium.  Will do another HD treatment today 4. Hypertension/volume - BP ok, but generalized volume excess; on midodrine for BP support 5. Anemia - Hgb 6.4 - suspect acute blood loss - last Hgb from [facility] 8.6 - transfusing prn - check hemocult- continue weekly Aranesp ^ to 100 6. Metabolic bone disease - Not on VDRA - is on sensipar; last P low at [facility] 1.8 - 2.8 here- no binder 7. Nutrition - on PEG feedings 8. ^ LFTs - would try to decrease volume and follow -  review of notes from [facility] show that they have been increasing lately. 9. VDRF - with trach per CCM 10. Complex distal femur fx - conservative tx with immobilizer per ortho      [name] A    Labs: Basic Metabolic Panel:  Recent Labs Lab 09/08/14 1653 09/09/14 0228  NA 132* 131*  K 4.6 4.7  CL 85* 85*  CO2 20 21  GLUCOSE 176* 77  BUN 157* 163*  CREATININE 4.66* 5.01*  CALCIUM 8.8 8.5  PHOS  --  2.8   Liver Function Tests: No results found for this basename: AST, ALT, ALKPHOS, BILITOT, PROT, ALBUMIN,  in the last 168 hours No results found for this basename: LIPASE, AMYLASE,  in the last 168 hours No results found for this basename: AMMONIA,  in the last 168 hours CBC:  Recent Labs Lab 09/08/14 1653 09/09/14 0228 09/09/14 1911  WBC 7.6 6.4 11.5*  NEUTROABS 6.9  --   --   HGB 7.3* 6.4* 7.3*  HCT 22.2* 19.7* 21.2*  MCV 85.7 87.9 86.2  PLT 191 169 170   Cardiac Enzymes: No results found for this basename: CKTOTAL, CKMB, CKMBINDEX, TROPONINI,  in the last 168 hours CBG:  Recent Labs Lab 09/09/14 0803 09/09/14 1331  09/09/14 1651 09/10/14 0058 09/10/14 0407  GLUCAP 87 80 74 95 112*    Iron Studies: No results found for this basename: IRON, TIBC, TRANSFERRIN, FERRITIN,  in the last 72 hours Studies/Results: Ir Pta Venous Right  09/09/2014   CLINICAL DATA:  Occluded right thigh dialysis graft.  EXAM: 1. ULTRASOUND GUIDANCE FOR VASCULAR ACCESS OF RIGHT THIGH DIALYSIS GRAFT. 2. DIALYSIS FISTULA DECLOT PROCEDURE WITH TWO SEPARATE GRAFT ACCESS SITES. 3. VENOUS ANGIOPLASTY OF VENOUS ANASTOMOSIS OF DIALYSIS GRAFT 4. ANGIOPLASTY OF ARTERIAL ANASTOMOSIS OF DIALYSIS GRAFT  ANESTHESIA/SEDATION: 1.5 mg IV Versed; 75 mcg IV Fentanyl.  Total Moderate Sedation Time  60 minutes.  CONTRAST:  50 mL OMNIPAQUE IOHEXOL 300 MG/ML  SOLN  MEDICATIONS: 2 mg tPA, 3000 U IV heparin  FLUOROSCOPY TIME:  8 minutes and 12 seconds.  PROCEDURE: The procedure, risks, benefits, and  alternatives were explained to the patient. Questions regarding the procedure were encouraged and answered. The patient understands and consents to the procedure.  The right thigh dialysis graft was prepped with Betadine in a sterile fashion, and a sterile drape was applied covering the operative field. A sterile gown and sterile gloves were used for the procedure. Local anesthesia was provided with 1% Lidocaine.  Preliminary ultrasound was performed of the dialysis fistula. Both antegrade and retrograde graft access was performed with micropuncture sets under direct ultrasound guidance. Ultrasound image documentation was performed. t-PA was instilled via each access. 7-French antegrade and 6-French retrograde sheaths were placed. A diagnostic catheter was advanced and contrast injection performed at the level of patent venous outflow. Outflow venography was also performed via the catheter. IV heparin was administered via a patent venous outflow.  Balloon angioplasty was performed at the level of the venous anastomosis with a 7 mm x 4 cm Conquest balloon.  Mechanical thrombectomy was performed within the dialysis graft with the Angiojet device. Thrombectomy across the arterial anastomosis was then performed with a 4-French Fogarty balloon catheter. Several passes were made with the Fogarty catheter. Suction thrombectomy was then performed through the antegrade sheath.  Graft patency was reassessed with angiography. Angioplasty across the arterial anastomosis was performed with a 6 mm x 4 cm Conquest balloon. Additional angiography was performed of the entire dialysis graft. Additional Angiojet thrombectomy was performed in the arterial limb.  Upon completion of the procedure, both sheaths were removed and hemostasis obtained with 3-0 Ethilon pursestring sutures.  COMPLICATIONS: None  FINDINGS: Ultrasound confirms thrombosis of the graft. There was a critical stenosis at the venous anastomosis of the graft. This was  treated with 7 mm balloon angioplasty. After graft patency was re-established, there is no evidence of significant residual stenosis at the venous anastomosis.  Focal stenosis was persistent at the arterial anastomosis after thrombectomy. This was treated with 6 mm balloon angioplasty with improve results and patent flow present after angioplasty. 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 Venous outflow is normally patent via the iliac veins and IVC.  IMPRESSION: Successful declot procedure to reestablish flow in an occluded right thigh dialysis graft. Venous anastomotic stenosis was treated with 7 mm balloon angioplasty. Arterial anastomotic stenosis was treated with 6 mm balloon angioplasty. Good flow was present after the procedure.  ACCESS: The graft remains amenable to percutaneous intervention.   Electronically Signed   By: Irish Lack M.D.   On: 09/09/2014 16:44   Ir Pta Venous Right  09/09/2014   CLINICAL DATA:  Occluded right thigh dialysis graft.  EXAM: 1. ULTRASOUND  GUIDANCE FOR VASCULAR ACCESS OF RIGHT THIGH DIALYSIS GRAFT. 2. DIALYSIS FISTULA DECLOT PROCEDURE WITH TWO SEPARATE GRAFT ACCESS SITES. 3. VENOUS ANGIOPLASTY OF VENOUS ANASTOMOSIS OF DIALYSIS GRAFT 4. ANGIOPLASTY OF ARTERIAL ANASTOMOSIS OF DIALYSIS GRAFT  ANESTHESIA/SEDATION: 1.5 mg IV Versed; 75 mcg IV Fentanyl.  Total Moderate Sedation Time  60 minutes.  CONTRAST:  50 mL OMNIPAQUE IOHEXOL 300 MG/ML  SOLN  MEDICATIONS: 2 mg tPA, 3000 U IV heparin  FLUOROSCOPY TIME:  8 minutes and 12 seconds.  PROCEDURE: The procedure, risks, benefits, and alternatives were explained to the patient. Questions regarding the procedure were encouraged and answered. The patient understands and consents to the procedure.  The right thigh dialysis graft was prepped with Betadine in a sterile fashion, and a sterile drape was applied covering the operative field. A sterile gown and sterile gloves were used for the procedure.  Local anesthesia was provided with 1% Lidocaine.  Preliminary ultrasound was performed of the dialysis fistula. Both antegrade and retrograde graft access was performed with micropuncture sets under direct ultrasound guidance. Ultrasound image documentation was performed. t-PA was instilled via each access. 7-French antegrade and 6-French retrograde sheaths were placed. A diagnostic catheter was advanced and contrast injection performed at the level of patent venous outflow. Outflow venography was also performed via the catheter. IV heparin was administered via a patent venous outflow.  Balloon angioplasty was performed at the level of the venous anastomosis with a 7 mm x 4 cm Conquest balloon.  Mechanical thrombectomy was performed within the dialysis graft with the Angiojet device. Thrombectomy across the arterial anastomosis was then performed with a 4-French Fogarty balloon catheter. Several passes were made with the Fogarty catheter. Suction thrombectomy was then performed through the antegrade sheath.  Graft patency was reassessed with angiography. Angioplasty across the arterial anastomosis was performed with a 6 mm x 4 cm Conquest balloon. Additional angiography was performed of the entire dialysis graft. Additional Angiojet thrombectomy was performed in the arterial limb.  Upon completion of the procedure, both sheaths were removed and hemostasis obtained with 3-0 Ethilon pursestring sutures.  COMPLICATIONS: None  FINDINGS: Ultrasound confirms thrombosis of the graft. There was a critical stenosis at the venous anastomosis of the graft. This was treated with 7 mm balloon angioplasty. After graft patency was re-established, there is no evidence of significant residual stenosis at the venous anastomosis.  Focal stenosis was persistent at the arterial anastomosis after thrombectomy. This was treated with 6 mm balloon angioplasty with improve results and patent flow present after angioplasty. Mild aneurysmal  disease is seen along the midportion of the arterial limb. Venous outflow is normally patent via the iliac veins and IVC.  IMPRESSION: Successful declot procedure to reestablish flow in an occluded right thigh dialysis graft. Venous anastomotic stenosis was treated with 7 mm balloon angioplasty. Arterial anastomotic stenosis was treated with 6 mm balloon angioplasty. Good flow was present after the procedure.  ACCESS: The graft remains amenable to percutaneous intervention.   Electronically Signed   By: Irish Lack M.D.   On: 09/09/2014 16:44   Ir Av Dialysis Graft Declot  09/09/2014   CLINICAL DATA:  Occluded right thigh dialysis graft.  EXAM: 1. ULTRASOUND GUIDANCE FOR VASCULAR ACCESS OF RIGHT THIGH DIALYSIS GRAFT. 2. DIALYSIS FISTULA DECLOT PROCEDURE WITH TWO SEPARATE GRAFT ACCESS SITES. 3. VENOUS ANGIOPLASTY OF VENOUS ANASTOMOSIS OF DIALYSIS GRAFT 4. ANGIOPLASTY OF ARTERIAL ANASTOMOSIS OF DIALYSIS GRAFT  ANESTHESIA/SEDATION: 1.5 mg IV Versed; 75 mcg IV Fentanyl.  Total Moderate Sedation Time  60  minutes.  CONTRAST:  50 mL OMNIPAQUE IOHEXOL 300 MG/ML  SOLN  MEDICATIONS: 2 mg tPA, 3000 U IV heparin  FLUOROSCOPY TIME:  8 minutes and 12 seconds.  PROCEDURE: The procedure, risks, benefits, and alternatives were explained to the patient. Questions regarding the procedure were encouraged and answered. The patient understands and consents to the procedure.  The right thigh dialysis graft was prepped with Betadine in a sterile fashion, and a sterile drape was applied covering the operative field. A sterile gown and sterile gloves were used for the procedure. Local anesthesia was provided with 1% Lidocaine.  Preliminary ultrasound was performed of the dialysis fistula. Both antegrade and retrograde graft access was performed with micropuncture sets under direct ultrasound guidance. Ultrasound image documentation was performed. t-PA was instilled via each access. 7-French antegrade and 6-French retrograde sheaths  were placed. A diagnostic catheter was advanced and contrast injection performed at the level of patent venous outflow. Outflow venography was also performed via the catheter. IV heparin was administered via a patent venous outflow.  Balloon angioplasty was performed at the level of the venous anastomosis with a 7 mm x 4 cm Conquest balloon.  Mechanical thrombectomy was performed within the dialysis graft with the Angiojet device. Thrombectomy across the arterial anastomosis was then performed with a 4-French Fogarty balloon catheter. Several passes were made with the Fogarty catheter. Suction thrombectomy was then performed through the antegrade sheath.  Graft patency was reassessed with angiography. Angioplasty across the arterial anastomosis was performed with a 6 mm x 4 cm Conquest balloon. Additional angiography was performed of the entire dialysis graft. Additional Angiojet thrombectomy was performed in the arterial limb.  Upon completion of the procedure, both sheaths were removed and hemostasis obtained with 3-0 Ethilon pursestring sutures.  COMPLICATIONS: None  FINDINGS: Ultrasound confirms thrombosis of the graft. There was a critical stenosis at the venous anastomosis of the graft. This was treated with 7 mm balloon angioplasty. After graft patency was re-established, there is no evidence of significant residual stenosis at the venous anastomosis.  Focal stenosis was persistent at the arterial anastomosis after thrombectomy. This was treated with 6 mm balloon angioplasty with improve results and patent flow present after angioplasty. 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 Venous outflow is normally patent via the iliac veins and IVC.  IMPRESSION: Successful declot procedure to reestablish flow in an occluded right thigh dialysis graft. Venous anastomotic stenosis was treated with 7 mm balloon angioplasty. Arterial anastomotic stenosis was treated with 6 mm balloon  angioplasty. Good flow was present after the procedure.  ACCESS: The graft remains amenable to percutaneous intervention.   Electronically Signed   By: Irish LackGlenn  Yamagata M.D.   On: 09/09/2014 16:44   Ir Angio Av Shunt Addl Access  09/09/2014   CLINICAL DATA:  Occluded right thigh dialysis graft.  EXAM: 1. ULTRASOUND GUIDANCE FOR VASCULAR ACCESS OF RIGHT THIGH DIALYSIS GRAFT. 2. DIALYSIS FISTULA DECLOT PROCEDURE WITH TWO SEPARATE GRAFT ACCESS SITES. 3. VENOUS ANGIOPLASTY OF VENOUS ANASTOMOSIS OF DIALYSIS GRAFT 4. ANGIOPLASTY OF ARTERIAL ANASTOMOSIS OF DIALYSIS GRAFT  ANESTHESIA/SEDATION: 1.5 mg IV Versed; 75 mcg IV Fentanyl.  Total Moderate Sedation Time  60 minutes.  CONTRAST:  50 mL OMNIPAQUE IOHEXOL 300 MG/ML  SOLN  MEDICATIONS: 2 mg tPA, 3000 U IV heparin  FLUOROSCOPY TIME:  8 minutes and 12 seconds.  PROCEDURE: The procedure, risks, benefits, and alternatives were explained to the patient. Questions regarding the procedure were encouraged and answered. The patient  understands and consents to the procedure.  The right thigh dialysis graft was prepped with Betadine in a sterile fashion, and a sterile drape was applied covering the operative field. A sterile gown and sterile gloves were used for the procedure. Local anesthesia was provided with 1% Lidocaine.  Preliminary ultrasound was performed of the dialysis fistula. Both antegrade and retrograde graft access was performed with micropuncture sets under direct ultrasound guidance. Ultrasound image documentation was performed. t-PA was instilled via each access. 7-French antegrade and 6-French retrograde sheaths were placed. A diagnostic catheter was advanced and contrast injection performed at the level of patent venous outflow. Outflow venography was also performed via the catheter. IV heparin was administered via a patent venous outflow.  Balloon angioplasty was performed at the level of the venous anastomosis with a 7 mm x 4 cm Conquest balloon.  Mechanical  thrombectomy was performed within the dialysis graft with the Angiojet device. Thrombectomy across the arterial anastomosis was then performed with a 4-French Fogarty balloon catheter. Several passes were made with the Fogarty catheter. Suction thrombectomy was then performed through the antegrade sheath.  Graft patency was reassessed with angiography. Angioplasty across the arterial anastomosis was performed with a 6 mm x 4 cm Conquest balloon. Additional angiography was performed of the entire dialysis graft. Additional Angiojet thrombectomy was performed in the arterial limb.  Upon completion of the procedure, both sheaths were removed and hemostasis obtained with 3-0 Ethilon pursestring sutures.  COMPLICATIONS: None  FINDINGS: Ultrasound confirms thrombosis of the graft. There was a critical stenosis at the venous anastomosis of the graft. This was treated with 7 mm balloon angioplasty. After graft patency was re-established, there is no evidence of significant residual stenosis at the venous anastomosis.  Focal stenosis was persistent at the arterial anastomosis after thrombectomy. This was treated with 6 mm balloon angioplasty with improve results and patent flow present after angioplasty. 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 Venous outflow is normally patent via the iliac veins and IVC.  IMPRESSION: Successful declot procedure to reestablish flow in an occluded right thigh dialysis graft. Venous anastomotic stenosis was treated with 7 mm balloon angioplasty. Arterial anastomotic stenosis was treated with 6 mm balloon angioplasty. Good flow was present after the procedure.  ACCESS: The graft remains amenable to percutaneous intervention.   Electronically Signed   By: Irish LackGlenn  Yamagata M.D.   On: 09/09/2014 16:44   Ir [place] Guide Vasc Access Right  09/09/2014   CLINICAL DATA:  Occluded right thigh dialysis graft.  EXAM: 1. ULTRASOUND GUIDANCE FOR VASCULAR ACCESS OF RIGHT  THIGH DIALYSIS GRAFT. 2. DIALYSIS FISTULA DECLOT PROCEDURE WITH TWO SEPARATE GRAFT ACCESS SITES. 3. VENOUS ANGIOPLASTY OF VENOUS ANASTOMOSIS OF DIALYSIS GRAFT 4. ANGIOPLASTY OF ARTERIAL ANASTOMOSIS OF DIALYSIS GRAFT  ANESTHESIA/SEDATION: 1.5 mg IV Versed; 75 mcg IV Fentanyl.  Total Moderate Sedation Time  60 minutes.  CONTRAST:  50 mL OMNIPAQUE IOHEXOL 300 MG/ML  SOLN  MEDICATIONS: 2 mg tPA, 3000 U IV heparin  FLUOROSCOPY TIME:  8 minutes and 12 seconds.  PROCEDURE: The procedure, risks, benefits, and alternatives were explained to the patient. Questions regarding the procedure were encouraged and answered. The patient understands and consents to the procedure.  The right thigh dialysis graft was prepped with Betadine in a sterile fashion, and a sterile drape was applied covering the operative field. A sterile gown and sterile gloves were used for the procedure. Local anesthesia was provided with 1% Lidocaine.  Preliminary ultrasound was performed  of the dialysis fistula. Both antegrade and retrograde graft access was performed with micropuncture sets under direct ultrasound guidance. Ultrasound image documentation was performed. t-PA was instilled via each access. 7-French antegrade and 6-French retrograde sheaths were placed. A diagnostic catheter was advanced and contrast injection performed at the level of patent venous outflow. Outflow venography was also performed via the catheter. IV heparin was administered via a patent venous outflow.  Balloon angioplasty was performed at the level of the venous anastomosis with a 7 mm x 4 cm Conquest balloon.  Mechanical thrombectomy was performed within the dialysis graft with the Angiojet device. Thrombectomy across the arterial anastomosis was then performed with a 4-French Fogarty balloon catheter. Several passes were made with the Fogarty catheter. Suction thrombectomy was then performed through the antegrade sheath.  Graft patency was reassessed with angiography.  Angioplasty across the arterial anastomosis was performed with a 6 mm x 4 cm Conquest balloon. Additional angiography was performed of the entire dialysis graft. Additional Angiojet thrombectomy was performed in the arterial limb.  Upon completion of the procedure, both sheaths were removed and hemostasis obtained with 3-0 Ethilon pursestring sutures.  COMPLICATIONS: None  FINDINGS: Ultrasound confirms thrombosis of the graft. There was a critical stenosis at the venous anastomosis of the graft. This was treated with 7 mm balloon angioplasty. After graft patency was re-established, there is no evidence of significant residual stenosis at the venous anastomosis.  Focal stenosis was persistent at the arterial anastomosis after thrombectomy. This was treated with 6 mm balloon angioplasty with improve results and patent flow present after angioplasty. 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 Venous outflow is normally patent via the iliac veins and IVC.  IMPRESSION: Successful declot procedure to reestablish flow in an occluded right thigh dialysis graft. Venous anastomotic stenosis was treated with 7 mm balloon angioplasty. Arterial anastomotic stenosis was treated with 6 mm balloon angioplasty. Good flow was present after the procedure.  ACCESS: The graft remains amenable to percutaneous intervention.   Electronically Signed   By: Irish Lack M.D.   On: 09/09/2014 16:44   Dg Chest Port 1 View  09/09/2014   CLINICAL DATA:  68 year old female with chronic respiratory failure. History of MRSA pneumonia. Shortness of breath.  EXAM: PORTABLE CHEST - 1 VIEW  COMPARISON:  Chest x-ray 09/08/2014.  FINDINGS: Lung volumes are slightly low. No consolidative airspace disease. No pleural effusions. No evidence of pulmonary edema. Mild cardiomegaly. The patient is rotated to the left on today's exam, resulting in distortion of the mediastinal contours and reduced diagnostic sensitivity and  specificity for mediastinal pathology. Atherosclerosis in the thoracic aorta. Tracheostomy tube in position with tip approximately 5.4 cm above the carina.  IMPRESSION: 1. Low lung volumes without radiographic evidence of acute cardiopulmonary disease. 2. Atherosclerosis. 3. Cardiomegaly.   Electronically Signed   By: Trudie Reed M.D.   On: 09/09/2014 07:35   Dg Chest Portable 1 View  09/08/2014   CLINICAL DATA:  69 year old female with left lower extremity pain.  EXAM: PORTABLE CHEST - 1 VIEW  COMPARISON:  Chest x-ray 07/02/2014  FINDINGS: Significant right rotation of the patient contributes to the appearance of the cardiomediastinal silhouette. Heart size appears relatively unchanged from the comparison.  Double density overlying the left heart border and the left lung favored to represent tortuosity of the descending thoracic aorta given the right rotation. Calcifications of the aortic arch.  Patchy opacities bilateral lungs, similar to prior. Coarsening of interstitial markings.  No visualized pneumothorax.  No large confluent airspace disease.  No large pleural effusion.  No displaced fracture is identified.  A tracheostomy tube in position, similar to prior.  IMPRESSION: Limited chest x-ray demonstrates no large confluent airspace disease. The scattered patchy opacities may reflect chronic lung changes, given their presence on comparison plain film. A developing infection difficult to exclude radiographically.  Atherosclerosis and tortuous descending thoracic aorta.  Unchanged position of tracheostomy tube.  Signed,  Yvone Neu. Loreta Ave, DO  [facility]  [facility]   Electronically Signed   By: Gilmer Mor D.O.   On: 09/08/2014 18:28   Dg Femur Left Port  09/08/2014   CLINICAL DATA:  Patient with left lower extremity pain. Unsure how long. Patient is bed ridden with end-stage renal disease.  EXAM: PORTABLE LEFT FEMUR - 2 VIEW  COMPARISON:  None.   FINDINGS: Exam limited secondary to technique and patient positioning. There is a comminuted displaced fracture through the distal femoral metaphysis with lateral displacement and foreshortening of the distal fracture fragment. The there is suggestion of associated lucency through the distal aspect of femoral metaphysis. The proximal aspect of the left femur including the left hip joint, femoral neck and proximal left femoral metaphysis are inadequately evaluated.  IMPRESSION: Comminuted foreshortened displaced fracture of the distal femoral metaphysis.  There is suggestion of associated lucency through the distal aspect of the left femur near the fracture site which may be secondary to radiographic technique and the associated fracture however an underlying osseous lesion and pathologic fracture should be considered.  The proximal aspect of the left femur including the proximal left femoral metaphysis, neck and left hip joint are not adequately assessed on this evaluation. If there is concern for acute traumatic osseous injury in this location, dedicated radiographs should be performed.   Electronically Signed   By: Annia Belt M.D.   On: 09/08/2014 18:30   Dg Tibia/fibula Left Port  09/08/2014   CLINICAL DATA:  Pain lower extremity. No trauma. Initial evaluation.  EXAM: PORTABLE LEFT TIBIA AND FIBULA - 2 VIEW  COMPARISON:  None.  FINDINGS: Comminuted fracture of the distal femur is present. Lucencies are noted in the proximal tibia and in the mid and distal tibial shaft. Process such as metastatic disease and or myeloma cannot be excluded. The tibia is intact. No tibial fracture is identified. Peripheral vascular disease.  IMPRESSION: 1. Comminuted distal femoral fracture. 2. Subtle punctate lucencies noted in the of tibia, metastatic disease and or myeloma cannot be excluded. No tibial fracture. 3. Peripheral vascular disease.   Electronically Signed   By: Maisie Fus  Register   On: 09/08/2014 18:26    Medications: Infusions:    Scheduled Medications: . sodium chloride   Intravenous Once  . antiseptic oral rinse  7 mL Mouth Rinse QID  . ceFEPime (MAXIPIME) IV  2 g Intravenous Q T,Th,Sat-1800  . chlorhexidine  15 mL Mouth Rinse BID  . Chlorhexidine Gluconate Cloth  6 each Topical Q0600  . cinacalcet  90 mg Oral Q breakfast  . [START ON 09/13/2014] darbepoetin (ARANESP) injection - DIALYSIS  100 mcg Intravenous Q Mon-HD  . dexamethasone  2 mg Per Tube Daily  . feeding supplement (NEPRO CARB STEADY)  1,000 mL Per Tube Q24H  . feeding supplement (PRO-STAT SUGAR FREE 64)  30 mL Per Tube TID  . free water  100 mL Per Tube Q6H  . heparin  5,000 Units Subcutaneous 3 times per day  . Influenza  vac split quadrivalent PF  0.5 mL Intramuscular Tomorrow-1000  . insulin aspart  2-6 Units Subcutaneous 6 times per day  . levETIRAcetam  250 mg Per Tube Q12H  . midodrine  10 mg Per Tube TID WC  . modafinil  100 mg Oral Daily  . mupirocin ointment  1 application Nasal BID  . pantoprazole (PROTONIX) IV  40 mg Intravenous QHS  . vancomycin  125 mg Per Tube 4 times per day  . vancomycin  1,000 mg Intravenous Q T,Th,Sa-HD  . vitamin A & D  1 application Topical Q12H    have reviewed scheduled and prn medications.  Physical Exam: General: looks swollen- alert, nods Heart: RRR- had one episode tachy yest with HD Lungs: CBS bilat/vent /trach Abdomen: soft, non tender Extremities: pitting edema Dialysis Access: right thigh AVG open at present    09/10/2014,8:07 AM  LOS: 2 days

## 2014-09-10 NOTE — Progress Notes (Signed)
CRITICAL VALUE ALERT  Critical value received: 6.8 HBG  Date of notification:  09/10/2014  Time of notification:  1448  Critical value read back:Yes.    Nurse who received alert:  Laveda NormanKalyne RN  MD notified (1st page):  K.Goldsborough,MD  Time of first page:  (protocol followed by Hemodialysis RN) MD notified (2nd page):  Time of second page:  Responding MD:  K.Goldsborough,MD  Time MD responded: (protocol followed by Hemodialysis RN Margarita GrizzleKristyn Hurt

## 2014-09-10 NOTE — Procedures (Signed)
Patient was seen on dialysis and the procedure was supervised.  BFR 400  Via thigh avg BP is  105/48.   Patient appears to be tolerating treatment well  [name] A 09/10/2014

## 2014-09-10 NOTE — Progress Notes (Signed)
Pt discharged back to [facility] via [facility] truck. Tube feeds and KVO IV fluids stopped prior to transfer. Family made aware of transfer. Pt on trach vent 30% when discharged. Central line D/C, pressure was held, bleeding stopped.

## 2014-09-10 NOTE — Progress Notes (Signed)
Hemodialysis- Order to transfuse 2 units on HD for hgb <7.5 per HD order sheet. Hgb results 6.8, will transfuse 2 units and notify MD.

## 2014-09-10 NOTE — Clinical Documentation Improvement (Signed)
MD's, NP's, and PA's  Documentation of "unspecified fracture of the lower end of the femur" . Patient is "bedbound" please clarify cause of the fracture if known for this admission.  Thank you   Cause:  --Traumatic --Stress --Pathologic --Subsequent  Document any associated diagnoses/conditions to cause fracture  Cannot Clinically determine     Evaluation with  X ray, conservative management of fracture    Thank You,   Raymond GurneyL. J. Chyla Schlender, RN, [name], CCDS [phone]

## 2014-09-10 NOTE — Progress Notes (Signed)
Hemodialysis- Tx extended x20 minutes to receive 2 units PRBCs on HD before transfer per standing orders.

## 2014-09-10 NOTE — Progress Notes (Signed)
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  To ED 10/14 with deformity to LLE. Radiograph shows L femoral fracture. PCCM asked to see for admission.   STUDIES:  L femur X-ray 10/14 > Comminuted foreshortened displaced fracture of the distal femoral metaphysis.  SIGNIFICANT EVENTS: 10/15 declotting left femoral av graft per IR 10/15 Orthopedics recommends conservative care with knee immobilizers. \10/16 MHB, transfer back to [facility] post HD.      HISTORY OF PRESENT ILLNESS:  68 year old female with PMH as below, which includes ESRD on HD, CVA, DM, Trach dependent, and seizures, presented to [facility] ED 10/14 with deformity to LLE. She is from [facility]. Family states that 10/13 she was noted to have some LLE edema and appeared to be in pain. 10/14 she was brought into ED for evaluation which discovered L femur fracture. PCCM has been asked to see for admission.   SUBJECTIVE: NAD, Non commutative   VITAL SIGNS: Temp:  [97 F (36.1 C)-99.6 F (37.6 C)] 99.6 F (37.6 C) (10/16 1238) Pulse Rate:  [27-142] 78 (10/16 1249) Resp:  [14-27] 19 (10/16 1249) BP: (88-168)/(48-99) 105/48 mmHg (10/16 1249) SpO2:  [90 %-100 %] 100 % (10/16 1249) FiO2 (%):  [30 %-50 %] 30 % (10/16 1223) Weight:  [184 lb 1.4 oz (83.5 kg)-186 lb 4.6 oz (84.5 kg)] 186 lb 4.6 oz (84.5 kg) (10/16 0500) HEMODYNAMICS:   VENTILATOR SETTINGS: Vent Mode:  [-] PRVC FiO2 (%):  [30 %-50 %] 30 % Set Rate:  [14 bmp] 14 bmp Vt Set:  [500 mL] 500 mL PEEP:  [5 cmH20] 5 cmH20 Plateau Pressure:  [14 cmH20-19 cmH20] 19 cmH20 INTAKE / OUTPUT:  Intake/Output Summary (Last 24 hours) at 09/10/14 1340 Last data filed at 09/09/14 2124  Gross per 24 hour  Intake    435 ml   Output    -56 ml  Net    491 ml    PHYSICAL EXAMINATION: General:  Obese female with chronic trach in NAD Neuro:  Spontaneously awake, alert, tracks w/ eyes. No verbal response HEENT:  [place]/AT, no JVD noted Cardiovascular:  RRR no m,r,g Lungs:  Resps even, unlabored. Synchronous with vent. Diminished in bases  Abdomen:  Obese, soft, non-tender, non-distended Musculoskeletal:  Left knee immobilizer  Skin:  AF fistula to RLE, +trill/briut   LABS:  CBC  Recent Labs Lab 09/08/14 1653 09/09/14 0228 09/09/14 1911  WBC 7.6 6.4 11.5*  HGB 7.3* 6.4* 7.3*  HCT 22.2* 19.7* 21.2*  PLT 191 169 170   Coag's  Recent Labs Lab 09/08/14 2359  APTT 31  INR 1.24   BMET  Recent Labs Lab 09/08/14 1653 09/09/14 0228  NA 132* 131*  K 4.6 4.7  CL 85* 85*  CO2 20 21  BUN 157* 163*  CREATININE 4.66* 5.01*  GLUCOSE 176* 77   Electrolytes  Recent Labs Lab 09/08/14 1653 09/09/14 0228  CALCIUM 8.8 8.5  MG  --  2.3  PHOS  --  2.8   Sepsis Markers  Recent Labs Lab 09/08/14 2329 09/09/14 0228 09/10/14 0610  PROCALCITON 54.68 57.01 40.91   ABG  Recent Labs Lab 09/08/14 2359  PHART 7.410  PCO2ART 33.6*  PO2ART 124.0*   Liver Enzymes No  results found for this basename: AST, ALT, ALKPHOS, BILITOT, ALBUMIN,  in the last 168 hours Cardiac Enzymes No results found for this basename: TROPONINI, PROBNP,  in the last 168 hours Glucose  Recent Labs Lab 09/09/14 1331 09/09/14 1651 09/10/14 0058 09/10/14 0407 09/10/14 0849 09/10/14 1236  GLUCAP 80 74 95 112* 129* 104*    Imaging Ir Pta Venous Right  09/09/2014   CLINICAL DATA:  Occluded right thigh dialysis graft.  EXAM: 1. ULTRASOUND GUIDANCE FOR VASCULAR ACCESS OF RIGHT THIGH DIALYSIS GRAFT. 2. DIALYSIS FISTULA DECLOT PROCEDURE WITH TWO SEPARATE GRAFT ACCESS SITES. 3. VENOUS ANGIOPLASTY OF VENOUS ANASTOMOSIS OF DIALYSIS GRAFT 4. ANGIOPLASTY OF ARTERIAL ANASTOMOSIS OF DIALYSIS GRAFT  ANESTHESIA/SEDATION: 1.5 mg IV  Versed; 75 mcg IV Fentanyl.  Total Moderate Sedation Time  60 minutes.  CONTRAST:  50 mL OMNIPAQUE IOHEXOL 300 MG/ML  SOLN  MEDICATIONS: 2 mg tPA, 3000 U IV heparin  FLUOROSCOPY TIME:  8 minutes and 12 seconds.  PROCEDURE: The procedure, risks, benefits, and alternatives were explained to the patient. Questions regarding the procedure were encouraged and answered. The patient understands and consents to the procedure.  The right thigh dialysis graft was prepped with Betadine in a sterile fashion, and a sterile drape was applied covering the operative field. A sterile gown and sterile gloves were used for the procedure. Local anesthesia was provided with 1% Lidocaine.  Preliminary ultrasound was performed of the dialysis fistula. Both antegrade and retrograde graft access was performed with micropuncture sets under direct ultrasound guidance. Ultrasound image documentation was performed. t-PA was instilled via each access. 7-French antegrade and 6-French retrograde sheaths were placed. A diagnostic catheter was advanced and contrast injection performed at the level of patent venous outflow. Outflow venography was also performed via the catheter. IV heparin was administered via a patent venous outflow.  Balloon angioplasty was performed at the level of the venous anastomosis with a 7 mm x 4 cm Conquest balloon.  Mechanical thrombectomy was performed within the dialysis graft with the Angiojet device. Thrombectomy across the arterial anastomosis was then performed with a 4-French Fogarty balloon catheter. Several passes were made with the Fogarty catheter. Suction thrombectomy was then performed through the antegrade sheath.  Graft patency was reassessed with angiography. Angioplasty across the arterial anastomosis was performed with a 6 mm x 4 cm Conquest balloon. Additional angiography was performed of the entire dialysis graft. Additional Angiojet thrombectomy was performed in the arterial limb.  Upon completion of  the procedure, both sheaths were removed and hemostasis obtained with 3-0 Ethilon pursestring sutures.  COMPLICATIONS: None  FINDINGS: Ultrasound confirms thrombosis of the graft. There was a critical stenosis at the venous anastomosis of the graft. This was treated with 7 mm balloon angioplasty. After graft patency was re-established, there is no evidence of significant residual stenosis at the venous anastomosis.  Focal stenosis was persistent at the arterial anastomosis after thrombectomy. This was treated with 6 mm balloon angioplasty with improve results and patent flow present after angioplasty. 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 Venous outflow is normally patent via the iliac veins and IVC.  IMPRESSION: Successful declot procedure to reestablish flow in an occluded right thigh dialysis graft. Venous anastomotic stenosis was treated with 7 mm balloon angioplasty. Arterial anastomotic stenosis was treated with 6 mm balloon angioplasty. Good flow was present after the procedure.  ACCESS: The graft remains amenable to percutaneous intervention.   Electronically Signed   By: Rudene Anda.D.  On: 09/09/2014 16:44   Ir Pta Venous Right  09/09/2014   CLINICAL DATA:  Occluded right thigh dialysis graft.  EXAM: 1. ULTRASOUND GUIDANCE FOR VASCULAR ACCESS OF RIGHT THIGH DIALYSIS GRAFT. 2. DIALYSIS FISTULA DECLOT PROCEDURE WITH TWO SEPARATE GRAFT ACCESS SITES. 3. VENOUS ANGIOPLASTY OF VENOUS ANASTOMOSIS OF DIALYSIS GRAFT 4. ANGIOPLASTY OF ARTERIAL ANASTOMOSIS OF DIALYSIS GRAFT  ANESTHESIA/SEDATION: 1.5 mg IV Versed; 75 mcg IV Fentanyl.  Total Moderate Sedation Time  60 minutes.  CONTRAST:  50 mL OMNIPAQUE IOHEXOL 300 MG/ML  SOLN  MEDICATIONS: 2 mg tPA, 3000 U IV heparin  FLUOROSCOPY TIME:  8 minutes and 12 seconds.  PROCEDURE: The procedure, risks, benefits, and alternatives were explained to the patient. Questions regarding the procedure were encouraged and answered. The  patient understands and consents to the procedure.  The right thigh dialysis graft was prepped with Betadine in a sterile fashion, and a sterile drape was applied covering the operative field. A sterile gown and sterile gloves were used for the procedure. Local anesthesia was provided with 1% Lidocaine.  Preliminary ultrasound was performed of the dialysis fistula. Both antegrade and retrograde graft access was performed with micropuncture sets under direct ultrasound guidance. Ultrasound image documentation was performed. t-PA was instilled via each access. 7-French antegrade and 6-French retrograde sheaths were placed. A diagnostic catheter was advanced and contrast injection performed at the level of patent venous outflow. Outflow venography was also performed via the catheter. IV heparin was administered via a patent venous outflow.  Balloon angioplasty was performed at the level of the venous anastomosis with a 7 mm x 4 cm Conquest balloon.  Mechanical thrombectomy was performed within the dialysis graft with the Angiojet device. Thrombectomy across the arterial anastomosis was then performed with a 4-French Fogarty balloon catheter. Several passes were made with the Fogarty catheter. Suction thrombectomy was then performed through the antegrade sheath.  Graft patency was reassessed with angiography. Angioplasty across the arterial anastomosis was performed with a 6 mm x 4 cm Conquest balloon. Additional angiography was performed of the entire dialysis graft. Additional Angiojet thrombectomy was performed in the arterial limb.  Upon completion of the procedure, both sheaths were removed and hemostasis obtained with 3-0 Ethilon pursestring sutures.  COMPLICATIONS: None  FINDINGS: Ultrasound confirms thrombosis of the graft. There was a critical stenosis at the venous anastomosis of the graft. This was treated with 7 mm balloon angioplasty. After graft patency was re-established, there is no evidence of  significant residual stenosis at the venous anastomosis.  Focal stenosis was persistent at the arterial anastomosis after thrombectomy. This was treated with 6 mm balloon angioplasty with improve results and patent flow present after angioplasty. 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 Venous outflow is normally patent via the iliac veins and IVC.  IMPRESSION: Successful declot procedure to reestablish flow in an occluded right thigh dialysis graft. Venous anastomotic stenosis was treated with 7 mm balloon angioplasty. Arterial anastomotic stenosis was treated with 6 mm balloon angioplasty. Good flow was present after the procedure.  ACCESS: The graft remains amenable to percutaneous intervention.   Electronically Signed   By: Irish LackGlenn  Yamagata M.D.   On: 09/09/2014 16:44   Ir Av Dialysis Graft Declot  09/09/2014   CLINICAL DATA:  Occluded right thigh dialysis graft.  EXAM: 1. ULTRASOUND GUIDANCE FOR VASCULAR ACCESS OF RIGHT THIGH DIALYSIS GRAFT. 2. DIALYSIS FISTULA DECLOT PROCEDURE WITH TWO SEPARATE GRAFT ACCESS SITES. 3. VENOUS ANGIOPLASTY OF VENOUS ANASTOMOSIS OF DIALYSIS GRAFT  4. ANGIOPLASTY OF ARTERIAL ANASTOMOSIS OF DIALYSIS GRAFT  ANESTHESIA/SEDATION: 1.5 mg IV Versed; 75 mcg IV Fentanyl.  Total Moderate Sedation Time  60 minutes.  CONTRAST:  50 mL OMNIPAQUE IOHEXOL 300 MG/ML  SOLN  MEDICATIONS: 2 mg tPA, 3000 U IV heparin  FLUOROSCOPY TIME:  8 minutes and 12 seconds.  PROCEDURE: The procedure, risks, benefits, and alternatives were explained to the patient. Questions regarding the procedure were encouraged and answered. The patient understands and consents to the procedure.  The right thigh dialysis graft was prepped with Betadine in a sterile fashion, and a sterile drape was applied covering the operative field. A sterile gown and sterile gloves were used for the procedure. Local anesthesia was provided with 1% Lidocaine.  Preliminary ultrasound was performed of the  dialysis fistula. Both antegrade and retrograde graft access was performed with micropuncture sets under direct ultrasound guidance. Ultrasound image documentation was performed. t-PA was instilled via each access. 7-French antegrade and 6-French retrograde sheaths were placed. A diagnostic catheter was advanced and contrast injection performed at the level of patent venous outflow. Outflow venography was also performed via the catheter. IV heparin was administered via a patent venous outflow.  Balloon angioplasty was performed at the level of the venous anastomosis with a 7 mm x 4 cm Conquest balloon.  Mechanical thrombectomy was performed within the dialysis graft with the Angiojet device. Thrombectomy across the arterial anastomosis was then performed with a 4-French Fogarty balloon catheter. Several passes were made with the Fogarty catheter. Suction thrombectomy was then performed through the antegrade sheath.  Graft patency was reassessed with angiography. Angioplasty across the arterial anastomosis was performed with a 6 mm x 4 cm Conquest balloon. Additional angiography was performed of the entire dialysis graft. Additional Angiojet thrombectomy was performed in the arterial limb.  Upon completion of the procedure, both sheaths were removed and hemostasis obtained with 3-0 Ethilon pursestring sutures.  COMPLICATIONS: None  FINDINGS: Ultrasound confirms thrombosis of the graft. There was a critical stenosis at the venous anastomosis of the graft. This was treated with 7 mm balloon angioplasty. After graft patency was re-established, there is no evidence of significant residual stenosis at the venous anastomosis.  Focal stenosis was persistent at the arterial anastomosis after thrombectomy. This was treated with 6 mm balloon angioplasty with improve results and patent flow present after angioplasty. 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 Venous outflow is normally patent via  the iliac veins and IVC.  IMPRESSION: Successful declot procedure to reestablish flow in an occluded right thigh dialysis graft. Venous anastomotic stenosis was treated with 7 mm balloon angioplasty. Arterial anastomotic stenosis was treated with 6 mm balloon angioplasty. Good flow was present after the procedure.  ACCESS: The graft remains amenable to percutaneous intervention.   Electronically Signed   By: Irish Lack M.D.   On: 09/09/2014 16:44   Ir Angio Av Shunt Addl Access  09/09/2014   CLINICAL DATA:  Occluded right thigh dialysis graft.  EXAM: 1. ULTRASOUND GUIDANCE FOR VASCULAR ACCESS OF RIGHT THIGH DIALYSIS GRAFT. 2. DIALYSIS FISTULA DECLOT PROCEDURE WITH TWO SEPARATE GRAFT ACCESS SITES. 3. VENOUS ANGIOPLASTY OF VENOUS ANASTOMOSIS OF DIALYSIS GRAFT 4. ANGIOPLASTY OF ARTERIAL ANASTOMOSIS OF DIALYSIS GRAFT  ANESTHESIA/SEDATION: 1.5 mg IV Versed; 75 mcg IV Fentanyl.  Total Moderate Sedation Time  60 minutes.  CONTRAST:  50 mL OMNIPAQUE IOHEXOL 300 MG/ML  SOLN  MEDICATIONS: 2 mg tPA, 3000 U IV heparin  FLUOROSCOPY TIME:  8 minutes and  12 seconds.  PROCEDURE: The procedure, risks, benefits, and alternatives were explained to the patient. Questions regarding the procedure were encouraged and answered. The patient understands and consents to the procedure.  The right thigh dialysis graft was prepped with Betadine in a sterile fashion, and a sterile drape was applied covering the operative field. A sterile gown and sterile gloves were used for the procedure. Local anesthesia was provided with 1% Lidocaine.  Preliminary ultrasound was performed of the dialysis fistula. Both antegrade and retrograde graft access was performed with micropuncture sets under direct ultrasound guidance. Ultrasound image documentation was performed. t-PA was instilled via each access. 7-French antegrade and 6-French retrograde sheaths were placed. A diagnostic catheter was advanced and contrast injection performed at the level of  patent venous outflow. Outflow venography was also performed via the catheter. IV heparin was administered via a patent venous outflow.  Balloon angioplasty was performed at the level of the venous anastomosis with a 7 mm x 4 cm Conquest balloon.  Mechanical thrombectomy was performed within the dialysis graft with the Angiojet device. Thrombectomy across the arterial anastomosis was then performed with a 4-French Fogarty balloon catheter. Several passes were made with the Fogarty catheter. Suction thrombectomy was then performed through the antegrade sheath.  Graft patency was reassessed with angiography. Angioplasty across the arterial anastomosis was performed with a 6 mm x 4 cm Conquest balloon. Additional angiography was performed of the entire dialysis graft. Additional Angiojet thrombectomy was performed in the arterial limb.  Upon completion of the procedure, both sheaths were removed and hemostasis obtained with 3-0 Ethilon pursestring sutures.  COMPLICATIONS: None  FINDINGS: Ultrasound confirms thrombosis of the graft. There was a critical stenosis at the venous anastomosis of the graft. This was treated with 7 mm balloon angioplasty. After graft patency was re-established, there is no evidence of significant residual stenosis at the venous anastomosis.  Focal stenosis was persistent at the arterial anastomosis after thrombectomy. This was treated with 6 mm balloon angioplasty with improve results and patent flow present after angioplasty. 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 Venous outflow is normally patent via the iliac veins and IVC.  IMPRESSION: Successful declot procedure to reestablish flow in an occluded right thigh dialysis graft. Venous anastomotic stenosis was treated with 7 mm balloon angioplasty. Arterial anastomotic stenosis was treated with 6 mm balloon angioplasty. Good flow was present after the procedure.  ACCESS: The graft remains amenable to  percutaneous intervention.   Electronically Signed   By: Irish LackGlenn  Yamagata M.D.   On: 09/09/2014 16:44   Ir [place] Guide Vasc Access Right  09/09/2014   CLINICAL DATA:  Occluded right thigh dialysis graft.  EXAM: 1. ULTRASOUND GUIDANCE FOR VASCULAR ACCESS OF RIGHT THIGH DIALYSIS GRAFT. 2. DIALYSIS FISTULA DECLOT PROCEDURE WITH TWO SEPARATE GRAFT ACCESS SITES. 3. VENOUS ANGIOPLASTY OF VENOUS ANASTOMOSIS OF DIALYSIS GRAFT 4. ANGIOPLASTY OF ARTERIAL ANASTOMOSIS OF DIALYSIS GRAFT  ANESTHESIA/SEDATION: 1.5 mg IV Versed; 75 mcg IV Fentanyl.  Total Moderate Sedation Time  60 minutes.  CONTRAST:  50 mL OMNIPAQUE IOHEXOL 300 MG/ML  SOLN  MEDICATIONS: 2 mg tPA, 3000 U IV heparin  FLUOROSCOPY TIME:  8 minutes and 12 seconds.  PROCEDURE: The procedure, risks, benefits, and alternatives were explained to the patient. Questions regarding the procedure were encouraged and answered. The patient understands and consents to the procedure.  The right thigh dialysis graft was prepped with Betadine in a sterile fashion, and a sterile drape was applied covering the  operative field. A sterile gown and sterile gloves were used for the procedure. Local anesthesia was provided with 1% Lidocaine.  Preliminary ultrasound was performed of the dialysis fistula. Both antegrade and retrograde graft access was performed with micropuncture sets under direct ultrasound guidance. Ultrasound image documentation was performed. t-PA was instilled via each access. 7-French antegrade and 6-French retrograde sheaths were placed. A diagnostic catheter was advanced and contrast injection performed at the level of patent venous outflow. Outflow venography was also performed via the catheter. IV heparin was administered via a patent venous outflow.  Balloon angioplasty was performed at the level of the venous anastomosis with a 7 mm x 4 cm Conquest balloon.  Mechanical thrombectomy was performed within the dialysis graft with the Angiojet device. Thrombectomy  across the arterial anastomosis was then performed with a 4-French Fogarty balloon catheter. Several passes were made with the Fogarty catheter. Suction thrombectomy was then performed through the antegrade sheath.  Graft patency was reassessed with angiography. Angioplasty across the arterial anastomosis was performed with a 6 mm x 4 cm Conquest balloon. Additional angiography was performed of the entire dialysis graft. Additional Angiojet thrombectomy was performed in the arterial limb.  Upon completion of the procedure, both sheaths were removed and hemostasis obtained with 3-0 Ethilon pursestring sutures.  COMPLICATIONS: None  FINDINGS: Ultrasound confirms thrombosis of the graft. There was a critical stenosis at the venous anastomosis of the graft. This was treated with 7 mm balloon angioplasty. After graft patency was re-established, there is no evidence of significant residual stenosis at the venous anastomosis.  Focal stenosis was persistent at the arterial anastomosis after thrombectomy. This was treated with 6 mm balloon angioplasty with improve results and patent flow present after angioplasty. 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 Venous outflow is normally patent via the iliac veins and IVC.  IMPRESSION: Successful declot procedure to reestablish flow in an occluded right thigh dialysis graft. Venous anastomotic stenosis was treated with 7 mm balloon angioplasty. Arterial anastomotic stenosis was treated with 6 mm balloon angioplasty. Good flow was present after the procedure.  ACCESS: The graft remains amenable to percutaneous intervention.   Electronically Signed   By: Irish Lack M.D.   On: 09/09/2014 16:44   Dg Chest Port 1 View  09/09/2014   CLINICAL DATA:  68 year old female with chronic respiratory failure. History of MRSA pneumonia. Shortness of breath.  EXAM: PORTABLE CHEST - 1 VIEW  COMPARISON:  Chest x-ray 09/08/2014.  FINDINGS: Lung volumes are  slightly low. No consolidative airspace disease. No pleural effusions. No evidence of pulmonary edema. Mild cardiomegaly. The patient is rotated to the left on today's exam, resulting in distortion of the mediastinal contours and reduced diagnostic sensitivity and specificity for mediastinal pathology. Atherosclerosis in the thoracic aorta. Tracheostomy tube in position with tip approximately 5.4 cm above the carina.  IMPRESSION: 1. Low lung volumes without radiographic evidence of acute cardiopulmonary disease. 2. Atherosclerosis. 3. Cardiomegaly.   Electronically Signed   By: Trudie Reed M.D.   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 -apneic on SBT attempts VAP prevention per protocol  CARDIOVASCULAR A:  Hypotension (chronic), but was also recently on pressors after dialysis 10/13 L fem CVL 10/9 (placed after + cultures drawn) >>>  P:  Continuous cardiac monitoring Continue home midodrine MAP  goal > 66mm/Hg  RENAL A:   [facility]RD on HD (TTS) - family reports access is clotted off as of 10/13.  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 pathologic  P: Management per ortho-recommend knee immobilizer and conservative  treatment   ENDOCRINE A:   DM Chronic steroids   P:   CBG monitoring SSI Continue preadmission dexamethasone ? reason If becomes hypotensive consider stress dose steroids.   NEUROLOGIC A:   Sequelae of remote TBI  P:   RASS goal: 0 Monitor   Family updated:  Per phone by Dr. Vassie Loll.  Interdisciplinary Family Meeting v Palliative Care Meeting:    TODAY'S SUMMARY:  Conservative management of femur fracture per orthopedics. AV graft declotted per IR 10/15. Return to [facility] post HD.  Oretha Milch MD 09/10/2014, 1:47 PM

## 2014-09-10 NOTE — Discharge Summary (Signed)
Physician Discharge Summary  Patient ID: MATSUKO KRETZ MRN: [id number] DOB/AGE: 1946-01-01 68 y.o.  Admit date: 09/08/2014 Discharge date: 09/10/2014  Problem List Active Problems:   Femur fracture  HPI: 68 year old female with PMH as below, which includes ESRD on HD, CVA, DM, Trach dependent, and seizures, presented to [facility] ED 10/14 with deformity to LLE. She is from [facility]. Family states that 10/13 she was noted to have some LLE edema and appeared to be in pain. 10/14 she was brought into ED for evaluation which discovered L femur fracture. PCCM has been asked to see for admission.   Hospital Course: STUDIES:  L femur X-ray 10/14 > Comminuted foreshortened displaced fracture of the distal femoral metaphysis.  SIGNIFICANT EVENTS:  10/15 declotting left femoral av graft per IR  10/15 Orthopedics recommends conservative care with knee immobilizers.  \10/16 MHB, transfer back to [facility] post HD.  10/15 2 d echo: Study Conclusions  - Left ventricle: The cavity size was normal. Systolic function was normal. The estimated ejection fraction was in the range of 55% to 60%. There was dynamic obstruction at restin the outflow tract, with a peak velocity of 406 cm/sec and a peak gradient of 66 mm Hg. Wall motion was normal; there were no regional wall motion abnormalities. Doppler parameters are consistent with abnormal left ventricular relaxation (grade 1 diastolic dysfunction). Doppler parameters are consistent with elevated ventricular end-diastolic filling pressure. - Aortic valve: Thickened with possible mobile echodensity. Transvalvular velocity was within the normal range. There was no stenosis. There was no regurgitation. - Aortic root: The aortic root was normal in size. - Mitral valve: Structurally normal valve. There was mild regurgitation. - Left atrium: The atrium was mildly dilated. - Right ventricle: Systolic function was normal. - Right atrium: The atrium was normal  in size. - Pulmonary arteries: Systolic pressure was within the normal range. - Pericardium, extracardiac: There was no pericardial effusion.  Impressions:  - A very limited quality echocardiogram. Endocarditis can&'t be excluded on the current study. If clinically indicated a TEE is recommended.  Transthoracic echocardiography. 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 -apneic on SBT attempts  VAP prevention per protocol  CARDIOVASCULAR  A:  Hypotension (chronic), but was also recently on pressors after dialysis 10/13  L fem CVL 10/9 (placed after + cultures drawn) >>>  P:  Continuous cardiac monitoring  Continue home midodrine  MAP goal > 43mm/Hg  RENAL  A:  [facility]RD on HD (TTS) - family reports access is clotted off as of 10/13.  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 pathologic  P:  Management per ortho -recommend knee immobilizer and conservative treatment  ENDOCRINE  A:  DM  Chronic steroids  P:  CBG monitoring  SSI  Continue  preadmission dexamethasone ? reason  If becomes hypotensive consider stress dose steroids.  NEUROLOGIC  A:  Sequelae of remote TBI  P:  RASS goal: 0  Monitor  Family updated:  Per phone by Dr. Vassie Loll.     Labs at discharge Lab Results  Component Value Date   CREATININE 5.01* 09/09/2014   BUN 163* 09/09/2014   NA 131* 09/09/2014   K 4.7 09/09/2014   CL 85* 09/09/2014   CO2 21 09/09/2014   Lab Results  Component Value Date   WBC 11.5* 09/09/2014   HGB 7.3* 09/09/2014   HCT 21.2* 09/09/2014   MCV 86.2 09/09/2014   PLT 170 09/09/2014   Lab Results  Component Value Date   ALT 27 06/29/2014   AST 31 06/29/2014   ALKPHOS 779* 06/29/2014   BILITOT 0.3 06/29/2014   Lab Results  Component Value Date   INR 1.24 09/08/2014   INR 1.18 07/01/2014   INR 0.9 01/16/2008    Current radiology studies Ir Pta Venous Right  09/09/2014   CLINICAL DATA:  Occluded right thigh dialysis graft.  EXAM: 1. ULTRASOUND GUIDANCE FOR VASCULAR ACCESS OF RIGHT THIGH DIALYSIS GRAFT. 2. DIALYSIS FISTULA DECLOT PROCEDURE WITH TWO SEPARATE GRAFT ACCESS SITES. 3. VENOUS ANGIOPLASTY OF VENOUS ANASTOMOSIS OF DIALYSIS GRAFT 4. ANGIOPLASTY OF ARTERIAL ANASTOMOSIS OF DIALYSIS GRAFT  ANESTHESIA/SEDATION: 1.5 mg IV Versed; 75 mcg IV Fentanyl.  Total Moderate Sedation Time  60 minutes.  CONTRAST:  50 mL OMNIPAQUE IOHEXOL 300 MG/ML  SOLN  MEDICATIONS: 2 mg tPA, 3000 U IV heparin  FLUOROSCOPY TIME:  8 minutes and 12 seconds.  PROCEDURE: The procedure, risks, benefits, and alternatives were explained to the patient. Questions regarding the procedure were encouraged and answered. The patient understands and consents to the procedure.  The right thigh dialysis graft was prepped with Betadine in a sterile fashion, and a sterile drape was applied covering the operative field. A sterile gown and sterile gloves were used for the procedure. Local anesthesia was provided with 1% Lidocaine.  Preliminary ultrasound was performed of the  dialysis fistula. Both antegrade and retrograde graft access was performed with micropuncture sets under direct ultrasound guidance. Ultrasound image documentation was performed. t-PA was instilled via each access. 7-French antegrade and 6-French retrograde sheaths were placed. A diagnostic catheter was advanced and contrast injection performed at the level of patent venous outflow. Outflow venography was also performed via the catheter. IV heparin was administered via a patent venous outflow.  Balloon angioplasty was performed at the level of the venous anastomosis with a 7 mm x 4 cm Conquest balloon.  Mechanical thrombectomy was performed within the dialysis graft with the Angiojet device. Thrombectomy across the arterial anastomosis was then performed with a 4-French Fogarty balloon catheter. Several passes were made with the Fogarty catheter. Suction thrombectomy was then performed through the antegrade sheath.  Graft patency was reassessed with angiography. Angioplasty across the arterial anastomosis was performed with a 6 mm x 4 cm Conquest balloon. Additional angiography was performed of the entire dialysis graft. Additional Angiojet thrombectomy was performed in the arterial limb.  Upon  completion of the procedure, both sheaths were removed and hemostasis obtained with 3-0 Ethilon pursestring sutures.  COMPLICATIONS: None  FINDINGS: Ultrasound confirms thrombosis of the graft. There was a critical stenosis at the venous anastomosis of the graft. This was treated with 7 mm balloon angioplasty. After graft patency was re-established, there is no evidence of significant residual stenosis at the venous anastomosis.  Focal stenosis was persistent at the arterial anastomosis after thrombectomy. This was treated with 6 mm balloon angioplasty with improve results and patent flow present after angioplasty. 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 Venous outflow is normally patent via  the iliac veins and IVC.  IMPRESSION: Successful declot procedure to reestablish flow in an occluded right thigh dialysis graft. Venous anastomotic stenosis was treated with 7 mm balloon angioplasty. Arterial anastomotic stenosis was treated with 6 mm balloon angioplasty. Good flow was present after the procedure.  ACCESS: The graft remains amenable to percutaneous intervention.   Electronically Signed   By: Irish Lack M.D.   On: 09/09/2014 16:44   Ir Pta Venous Right  09/09/2014   CLINICAL DATA:  Occluded right thigh dialysis graft.  EXAM: 1. ULTRASOUND GUIDANCE FOR VASCULAR ACCESS OF RIGHT THIGH DIALYSIS GRAFT. 2. DIALYSIS FISTULA DECLOT PROCEDURE WITH TWO SEPARATE GRAFT ACCESS SITES. 3. VENOUS ANGIOPLASTY OF VENOUS ANASTOMOSIS OF DIALYSIS GRAFT 4. ANGIOPLASTY OF ARTERIAL ANASTOMOSIS OF DIALYSIS GRAFT  ANESTHESIA/SEDATION: 1.5 mg IV Versed; 75 mcg IV Fentanyl.  Total Moderate Sedation Time  60 minutes.  CONTRAST:  50 mL OMNIPAQUE IOHEXOL 300 MG/ML  SOLN  MEDICATIONS: 2 mg tPA, 3000 U IV heparin  FLUOROSCOPY TIME:  8 minutes and 12 seconds.  PROCEDURE: The procedure, risks, benefits, and alternatives were explained to the patient. Questions regarding the procedure were encouraged and answered. The patient understands and consents to the procedure.  The right thigh dialysis graft was prepped with Betadine in a sterile fashion, and a sterile drape was applied covering the operative field. A sterile gown and sterile gloves were used for the procedure. Local anesthesia was provided with 1% Lidocaine.  Preliminary ultrasound was performed of the dialysis fistula. Both antegrade and retrograde graft access was performed with micropuncture sets under direct ultrasound guidance. Ultrasound image documentation was performed. t-PA was instilled via each access. 7-French antegrade and 6-French retrograde sheaths were placed. A diagnostic catheter was advanced and contrast injection performed at the level of patent  venous outflow. Outflow venography was also performed via the catheter. IV heparin was administered via a patent venous outflow.  Balloon angioplasty was performed at the level of the venous anastomosis with a 7 mm x 4 cm Conquest balloon.  Mechanical thrombectomy was performed within the dialysis graft with the Angiojet device. Thrombectomy across the arterial anastomosis was then performed with a 4-French Fogarty balloon catheter. Several passes were made with the Fogarty catheter. Suction thrombectomy was then performed through the antegrade sheath.  Graft patency was reassessed with angiography. Angioplasty across the arterial anastomosis was performed with a 6 mm x 4 cm Conquest balloon. Additional angiography was performed of the entire dialysis graft. Additional Angiojet thrombectomy was performed in the arterial limb.  Upon completion of the procedure, both sheaths were removed and hemostasis obtained with 3-0 Ethilon pursestring sutures.  COMPLICATIONS: None  FINDINGS: Ultrasound confirms thrombosis of the graft. There was a critical stenosis at the venous anastomosis of the graft. This was treated with 7 mm balloon angioplasty. After graft patency was re-established, there is no  evidence of significant residual stenosis at the venous anastomosis.  Focal stenosis was persistent at the arterial anastomosis after thrombectomy. This was treated with 6 mm balloon angioplasty with improve results and patent flow present after angioplasty. 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 Venous outflow is normally patent via the iliac veins and IVC.  IMPRESSION: Successful declot procedure to reestablish flow in an occluded right thigh dialysis graft. Venous anastomotic stenosis was treated with 7 mm balloon angioplasty. Arterial anastomotic stenosis was treated with 6 mm balloon angioplasty. Good flow was present after the procedure.  ACCESS: The graft remains amenable to percutaneous  intervention.   Electronically Signed   By: Irish Lack M.D.   On: 09/09/2014 16:44   Ir Av Dialysis Graft Declot  09/09/2014   CLINICAL DATA:  Occluded right thigh dialysis graft.  EXAM: 1. ULTRASOUND GUIDANCE FOR VASCULAR ACCESS OF RIGHT THIGH DIALYSIS GRAFT. 2. DIALYSIS FISTULA DECLOT PROCEDURE WITH TWO SEPARATE GRAFT ACCESS SITES. 3. VENOUS ANGIOPLASTY OF VENOUS ANASTOMOSIS OF DIALYSIS GRAFT 4. ANGIOPLASTY OF ARTERIAL ANASTOMOSIS OF DIALYSIS GRAFT  ANESTHESIA/SEDATION: 1.5 mg IV Versed; 75 mcg IV Fentanyl.  Total Moderate Sedation Time  60 minutes.  CONTRAST:  50 mL OMNIPAQUE IOHEXOL 300 MG/ML  SOLN  MEDICATIONS: 2 mg tPA, 3000 U IV heparin  FLUOROSCOPY TIME:  8 minutes and 12 seconds.  PROCEDURE: The procedure, risks, benefits, and alternatives were explained to the patient. Questions regarding the procedure were encouraged and answered. The patient understands and consents to the procedure.  The right thigh dialysis graft was prepped with Betadine in a sterile fashion, and a sterile drape was applied covering the operative field. A sterile gown and sterile gloves were used for the procedure. Local anesthesia was provided with 1% Lidocaine.  Preliminary ultrasound was performed of the dialysis fistula. Both antegrade and retrograde graft access was performed with micropuncture sets under direct ultrasound guidance. Ultrasound image documentation was performed. t-PA was instilled via each access. 7-French antegrade and 6-French retrograde sheaths were placed. A diagnostic catheter was advanced and contrast injection performed at the level of patent venous outflow. Outflow venography was also performed via the catheter. IV heparin was administered via a patent venous outflow.  Balloon angioplasty was performed at the level of the venous anastomosis with a 7 mm x 4 cm Conquest balloon.  Mechanical thrombectomy was performed within the dialysis graft with the Angiojet device. Thrombectomy across the  arterial anastomosis was then performed with a 4-French Fogarty balloon catheter. Several passes were made with the Fogarty catheter. Suction thrombectomy was then performed through the antegrade sheath.  Graft patency was reassessed with angiography. Angioplasty across the arterial anastomosis was performed with a 6 mm x 4 cm Conquest balloon. Additional angiography was performed of the entire dialysis graft. Additional Angiojet thrombectomy was performed in the arterial limb.  Upon completion of the procedure, both sheaths were removed and hemostasis obtained with 3-0 Ethilon pursestring sutures.  COMPLICATIONS: None  FINDINGS: Ultrasound confirms thrombosis of the graft. There was a critical stenosis at the venous anastomosis of the graft. This was treated with 7 mm balloon angioplasty. After graft patency was re-established, there is no evidence of significant residual stenosis at the venous anastomosis.  Focal stenosis was persistent at the arterial anastomosis after thrombectomy. This was treated with 6 mm balloon angioplasty with improve results and patent flow present after angioplasty. 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 Venous outflow is normally patent  via the iliac veins and IVC.  IMPRESSION: Successful declot procedure to reestablish flow in an occluded right thigh dialysis graft. Venous anastomotic stenosis was treated with 7 mm balloon angioplasty. Arterial anastomotic stenosis was treated with 6 mm balloon angioplasty. Good flow was present after the procedure.  ACCESS: The graft remains amenable to percutaneous intervention.   Electronically Signed   By: Irish Lack M.D.   On: 09/09/2014 16:44   Ir Angio Av Shunt Addl Access  09/09/2014   CLINICAL DATA:  Occluded right thigh dialysis graft.  EXAM: 1. ULTRASOUND GUIDANCE FOR VASCULAR ACCESS OF RIGHT THIGH DIALYSIS GRAFT. 2. DIALYSIS FISTULA DECLOT PROCEDURE WITH TWO SEPARATE GRAFT ACCESS SITES. 3. VENOUS  ANGIOPLASTY OF VENOUS ANASTOMOSIS OF DIALYSIS GRAFT 4. ANGIOPLASTY OF ARTERIAL ANASTOMOSIS OF DIALYSIS GRAFT  ANESTHESIA/SEDATION: 1.5 mg IV Versed; 75 mcg IV Fentanyl.  Total Moderate Sedation Time  60 minutes.  CONTRAST:  50 mL OMNIPAQUE IOHEXOL 300 MG/ML  SOLN  MEDICATIONS: 2 mg tPA, 3000 U IV heparin  FLUOROSCOPY TIME:  8 minutes and 12 seconds.  PROCEDURE: The procedure, risks, benefits, and alternatives were explained to the patient. Questions regarding the procedure were encouraged and answered. The patient understands and consents to the procedure.  The right thigh dialysis graft was prepped with Betadine in a sterile fashion, and a sterile drape was applied covering the operative field. A sterile gown and sterile gloves were used for the procedure. Local anesthesia was provided with 1% Lidocaine.  Preliminary ultrasound was performed of the dialysis fistula. Both antegrade and retrograde graft access was performed with micropuncture sets under direct ultrasound guidance. Ultrasound image documentation was performed. t-PA was instilled via each access. 7-French antegrade and 6-French retrograde sheaths were placed. A diagnostic catheter was advanced and contrast injection performed at the level of patent venous outflow. Outflow venography was also performed via the catheter. IV heparin was administered via a patent venous outflow.  Balloon angioplasty was performed at the level of the venous anastomosis with a 7 mm x 4 cm Conquest balloon.  Mechanical thrombectomy was performed within the dialysis graft with the Angiojet device. Thrombectomy across the arterial anastomosis was then performed with a 4-French Fogarty balloon catheter. Several passes were made with the Fogarty catheter. Suction thrombectomy was then performed through the antegrade sheath.  Graft patency was reassessed with angiography. Angioplasty across the arterial anastomosis was performed with a 6 mm x 4 cm Conquest balloon. Additional  angiography was performed of the entire dialysis graft. Additional Angiojet thrombectomy was performed in the arterial limb.  Upon completion of the procedure, both sheaths were removed and hemostasis obtained with 3-0 Ethilon pursestring sutures.  COMPLICATIONS: None  FINDINGS: Ultrasound confirms thrombosis of the graft. There was a critical stenosis at the venous anastomosis of the graft. This was treated with 7 mm balloon angioplasty. After graft patency was re-established, there is no evidence of significant residual stenosis at the venous anastomosis.  Focal stenosis was persistent at the arterial anastomosis after thrombectomy. This was treated with 6 mm balloon angioplasty with improve results and patent flow present after angioplasty. 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 Venous outflow is normally patent via the iliac veins and IVC.  IMPRESSION: Successful declot procedure to reestablish flow in an occluded right thigh dialysis graft. Venous anastomotic stenosis was treated with 7 mm balloon angioplasty. Arterial anastomotic stenosis was treated with 6 mm balloon angioplasty. Good flow was present after the procedure.  ACCESS: The graft remains  amenable to percutaneous intervention.   Electronically Signed   By: Irish Lack M.D.   On: 09/09/2014 16:44   Ir US Guide Vasc Access Right  09/09/2014   CLINICAL DATA:  Occluded right thigh dialysis graft.  EXAM: 1. ULTRASOUND GUIDANCE FOR VASCULAR ACCESS OF RIGHT THIGH DIALYSIS GRAFT. 2. DIALYSIS FISTULA DECLOT PROCEDURE WITH TWO SEPARATE GRAFT ACCESS SITES. 3. VENOUS ANGIOPLASTY OF VENOUS ANASTOMOSIS OF DIALYSIS GRAFT 4. ANGIOPLASTY OF ARTERIAL ANASTOMOSIS OF DIALYSIS GRAFT  ANESTHESIA/SEDATION: 1.5 mg IV Versed; 75 mcg IV Fentanyl.  Total Moderate Sedation Time  60 minutes.  CONTRAST:  50 mL OMNIPAQUE IOHEXOL 300 MG/ML  SOLN  MEDICATIONS: 2 mg tPA, 3000 U IV heparin  FLUOROSCOPY TIME:  8 minutes and 12 seconds.   PROCEDURE: The procedure, risks, benefits, and alternatives were explained to the patient. Questions regarding the procedure were encouraged and answered. The patient understands and consents to the procedure.  The right thigh dialysis graft was prepped with Betadine in a sterile fashion, and a sterile drape was applied covering the operative field. A sterile gown and sterile gloves were used for the procedure. Local anesthesia was provided with 1% Lidocaine.  Preliminary ultrasound was performed of the dialysis fistula. Both antegrade and retrograde graft access was performed with micropuncture sets under direct ultrasound guidance. Ultrasound image documentation was performed. t-PA was instilled via each access. 7-French antegrade and 6-French retrograde sheaths were placed. A diagnostic catheter was advanced and contrast injection performed at the level of patent venous outflow. Outflow venography was also performed via the catheter. IV heparin was administered via a patent venous outflow.  Balloon angioplasty was performed at the level of the venous anastomosis with a 7 mm x 4 cm Conquest balloon.  Mechanical thrombectomy was performed within the dialysis graft with the Angiojet device. Thrombectomy across the arterial anastomosis was then performed with a 4-French Fogarty balloon catheter. Several passes were made with the Fogarty catheter. Suction thrombectomy was then performed through the antegrade sheath.  Graft patency was reassessed with angiography. Angioplasty across the arterial anastomosis was performed with a 6 mm x 4 cm Conquest balloon. Additional angiography was performed of the entire dialysis graft. Additional Angiojet thrombectomy was performed in the arterial limb.  Upon completion of the procedure, both sheaths were removed and hemostasis obtained with 3-0 Ethilon pursestring sutures.  COMPLICATIONS: None  FINDINGS: Ultrasound confirms thrombosis of the graft. There was a critical stenosis at  the venous anastomosis of the graft. This was treated with 7 mm balloon angioplasty. After graft patency was re-established, there is no evidence of significant residual stenosis at the venous anastomosis.  Focal stenosis was persistent at the arterial anastomosis after thrombectomy. This was treated with 6 mm balloon angioplasty with improve results and patent flow present after angioplasty. 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 Venous outflow is normally patent via the iliac veins and IVC.  IMPRESSION: Successful declot procedure to reestablish flow in an occluded right thigh dialysis graft. Venous anastomotic stenosis was treated with 7 mm balloon angioplasty. Arterial anastomotic stenosis was treated with 6 mm balloon angioplasty. Good flow was present after the procedure.  ACCESS: The graft remains amenable to percutaneous intervention.   Electronically Signed   By: Irish Lack M.D.   On: 09/09/2014 16:44   Dg Chest Port 1 View  09/09/2014   CLINICAL DATA:  68 year old female with chronic respiratory failure. History of MRSA pneumonia. Shortness of breath.  EXAM: PORTABLE CHEST - 1  VIEW  COMPARISON:  Chest x-ray 09/08/2014.  FINDINGS: Lung volumes are slightly low. No consolidative airspace disease. No pleural effusions. No evidence of pulmonary edema. Mild cardiomegaly. The patient is rotated to the left on today's exam, resulting in distortion of the mediastinal contours and reduced diagnostic sensitivity and specificity for mediastinal pathology. Atherosclerosis in the thoracic aorta. Tracheostomy tube in position with tip approximately 5.4 cm above the carina.  IMPRESSION: 1. Low lung volumes without radiographic evidence of acute cardiopulmonary disease. 2. Atherosclerosis. 3. Cardiomegaly.   Electronically Signed   By: Trudie Reed M.D.   On: 09/09/2014 07:35   Dg Chest Portable 1 View  09/08/2014   CLINICAL DATA:  68 year old female with left lower  extremity pain.  EXAM: PORTABLE CHEST - 1 VIEW  COMPARISON:  Chest x-ray 07/02/2014  FINDINGS: Significant right rotation of the patient contributes to the appearance of the cardiomediastinal silhouette. Heart size appears relatively unchanged from the comparison.  Double density overlying the left heart border and the left lung favored to represent tortuosity of the descending thoracic aorta given the right rotation. Calcifications of the aortic arch.  Patchy opacities bilateral lungs, similar to prior. Coarsening of interstitial markings.  No visualized pneumothorax.  No large confluent airspace disease.  No large pleural effusion.  No displaced fracture is identified.  A tracheostomy tube in position, similar to prior.  IMPRESSION: Limited chest x-ray demonstrates no large confluent airspace disease. The scattered patchy opacities may reflect chronic lung changes, given their presence on comparison plain film. A developing infection difficult to exclude radiographically.  Atherosclerosis and tortuous descending thoracic aorta.  Unchanged position of tracheostomy tube.  Signed,  Yvone Neu. Loreta Ave, DO  [facility]  [facility]   Electronically Signed   By: Gilmer Mor D.O.   On: 09/08/2014 18:28   Dg Femur Left Port  09/08/2014   CLINICAL DATA:  Patient with left lower extremity pain. Unsure how long. Patient is bed ridden with end-stage renal disease.  EXAM: PORTABLE LEFT FEMUR - 2 VIEW  COMPARISON:  None.  FINDINGS: Exam limited secondary to technique and patient positioning. There is a comminuted displaced fracture through the distal femoral metaphysis with lateral displacement and foreshortening of the distal fracture fragment. The there is suggestion of associated lucency through the distal aspect of femoral metaphysis. The proximal aspect of the left femur including the left hip joint, femoral neck and proximal left femoral metaphysis are inadequately  evaluated.  IMPRESSION: Comminuted foreshortened displaced fracture of the distal femoral metaphysis.  There is suggestion of associated lucency through the distal aspect of the left femur near the fracture site which may be secondary to radiographic technique and the associated fracture however an underlying osseous lesion and pathologic fracture should be considered.  The proximal aspect of the left femur including the proximal left femoral metaphysis, neck and left hip joint are not adequately assessed on this evaluation. If there is concern for acute traumatic osseous injury in this location, dedicated radiographs should be performed.   Electronically Signed   By: Annia Belt M.D.   On: 09/08/2014 18:30   Dg Tibia/fibula Left Port  09/08/2014   CLINICAL DATA:  Pain lower extremity. No trauma. Initial evaluation.  EXAM: PORTABLE LEFT TIBIA AND FIBULA - 2 VIEW  COMPARISON:  None.  FINDINGS: Comminuted fracture of the distal femur is present. Lucencies are noted in the proximal tibia and in the mid and distal tibial shaft. Process such as metastatic disease and  or myeloma cannot be excluded. The tibia is intact. No tibial fracture is identified. Peripheral vascular disease.  IMPRESSION: 1. Comminuted distal femoral fracture. 2. Subtle punctate lucencies noted in the of tibia, metastatic disease and or myeloma cannot be excluded. No tibial fracture. 3. Peripheral vascular disease.   Electronically Signed   By: Maisie Fushomas  Register   On: 09/08/2014 18:26    Disposition:  03-Skilled Nursing Facility  Discharge Instructions   Discharge to SNF when bed available    Complete by:  As directed             Medication List         acetaminophen 160 MG/5ML solution  Commonly known as:  TYLENOL  Place 650 mg into feeding tube every 6 (six) hours as needed for mild pain or fever.     ALBUMIN (HUMAN) IV  Inject 25 g into the vein 3 (three) times a week. IVPB     chlorhexidine 0.12 % solution  Commonly  known as:  PERIDEX  Place 15 mLs onto teeth every 12 (twelve) hours.     cinacalcet 90 MG tablet  Commonly known as:  SENSIPAR  90 mg by Feeding Tube route daily.     darbepoetin 60 MCG/0.3ML Soln injection  Commonly known as:  ARANESP  Inject 60 mcg into the skin every 7 (seven) days.     dexamethasone 2 MG tablet  Commonly known as:  DECADRON  2 mg by Feeding Tube route daily.     ipratropium-albuterol 0.5-2.5 (3) MG/3ML Soln  Commonly known as:  DUONEB  Take 3 mLs by nebulization every 6 (six) hours as needed (for shortness of breath).     levETIRAcetam 100 MG/ML solution  Commonly known as:  KEPPRA  Place 250 mg into feeding tube every 12 (twelve) hours.     MAXIPIME IJ  Inject 500 mg as directed daily. Started on 09-07-14 give dose after dialysis. If given on dialysis days.     midodrine 5 MG tablet  Commonly known as:  PROAMATINE  10 mg by Feeding Tube route every 8 (eight) hours.     modafinil 100 MG tablet  Commonly known as:  PROVIGIL  Take 100 mg by mouth daily.     NOVASOURCE RENAL Liqd  45 mL/hr by Feeding Tube route continuous.     ondansetron 4 MG/2ML Soln injection  Commonly known as:  ZOFRAN  Inject 4 mg into the vein every 6 (six) hours as needed for nausea or vomiting.     polyethylene glycol packet  Commonly known as:  MIRALAX / GLYCOLAX  17 g by Feeding Tube route daily as needed for mild constipation. As needed     polyvinyl alcohol 1.4 % ophthalmic solution  Commonly known as:  LIQUIFILM TEARS  Place 1 drop into both eyes every 4 (four) hours as needed for dry eyes.     sodium chloride 0.9 % infusion  Inject 250 mLs into the vein as needed (if IV carrier fluid needed.).     STERILE WATER PO  100 mLs by Feeding Tube route every 6 (six) hours.     vancomycin 1 GM/200ML Soln  Commonly known as:  VANCOCIN  Inject 200 mLs (1,000 mg total) into the vein Every Tuesday,Thursday,and Saturday with dialysis.     vancomycin 50 mg/mL oral solution   Commonly known as:  VANCOCIN  Place 2.5 mLs (125 mg total) into feeding tube every 6 (six) hours.     vitamin A & D  ointment  Apply 1 application topically every 12 (twelve) hours.          Discharged Condition: poor  Time spent on discharge greater than 40 minutes.  Vital signs at Discharge. Temp:  [97 F (36.1 C)-99.6 F (37.6 C)] 99.6 F (37.6 C) (10/16 1238) Pulse Rate:  [27-142] 78 (10/16 1249) Resp:  [14-27] 19 (10/16 1249) BP: (88-168)/(48-99) 105/48 mmHg (10/16 1249) SpO2:  [90 %-100 %] 100 % (10/16 1249) FiO2 (%):  [30 %-50 %] 30 % (10/16 1249) Weight:  [184 lb 1.4 oz (83.5 kg)-186 lb 4.6 oz (84.5 kg)] 186 lb 4.6 oz (84.5 kg) (10/16 0500) Office follow up Special Information or instructions. Per [facility]. Signed: Brett CanalesSteve Minor ACNP Adolph PollackLe [facility] PCCM Pager [phone] till 3 pm If no answer page [phone]  Physician Statement:   The Patient was personally examined, the discharge assessment and plan has been personally reviewed and I agree with ACNP Minor's assessment and plan. > 30 minutes of time have been dedicated to discharge assessment, planning and discharge instructions. Updated daughter in detail  Oretha MilchALVA,Fox Salminen V. MD  09/10/2014, 1:53 PM

## 2014-09-10 NOTE — Progress Notes (Signed)
Notified [facility] of pt needing service to transfer. Called Report [facility]. Spoke to Azzie RoupEsther Edoh, RN to receive pt upon arrival. Notified Arman Bogusngela Proctor pt daughter of pt status and plan [facility]. Will continue to monitor.

## 2014-09-10 NOTE — Progress Notes (Signed)
Hemodialysis-Per blood bank pt has antibodies which will delay blood release, Notified Dr. Kathrene BongoGoldsborough. Will attempt to transfuse if at all possible during HD, but ok to give on floor if not able. Continue to monitor patient

## 2014-09-11 LAB — TYPE AND SCREEN
ABO/RH(D): A POS
ANTIBODY SCREEN: POSITIVE
DAT, IGG: NEGATIVE
DONOR AG TYPE: NEGATIVE
Donor AG Type: NEGATIVE
Donor AG Type: NEGATIVE
Donor AG Type: NEGATIVE
PT AG Type: NEGATIVE
UNIT DIVISION: 0
Unit division: 0
Unit division: 0
Unit division: 0

## 2014-09-13 LAB — GLUCOSE, CAPILLARY: Glucose-Capillary: 179 mg/dL — ABNORMAL HIGH (ref 70–99)

## 2014-09-14 ENCOUNTER — Other Ambulatory Visit (HOSPITAL_COMMUNITY): Payer: Self-pay | Admitting: Internal Medicine

## 2014-09-14 DIAGNOSIS — N186 End stage renal disease: Secondary | ICD-10-CM

## 2014-09-15 ENCOUNTER — Ambulatory Visit (HOSPITAL_COMMUNITY)
Admission: RE | Admit: 2014-09-15 | Discharge: 2014-09-15 | Disposition: A | Discharge status: Home / Self Care | Payer: Medicare Other | Source: Ambulatory Visit | Attending: Internal Medicine | Admitting: Internal Medicine

## 2014-09-15 ENCOUNTER — Other Ambulatory Visit (HOSPITAL_COMMUNITY): Payer: Self-pay | Admitting: Internal Medicine

## 2014-09-15 ENCOUNTER — Ambulatory Visit (HOSPITAL_COMMUNITY)
Admission: RE | Admit: 2014-09-15 | Discharge: 2014-09-15 | Disposition: A | Discharge status: Home / Self Care | Payer: Medicare Other | Source: Other Acute Inpatient Hospital | Attending: Internal Medicine | Admitting: Internal Medicine

## 2014-09-15 ENCOUNTER — Ambulatory Visit (HOSPITAL_COMMUNITY): Admission: AD | Admit: 2014-09-15 | Payer: Medicare Other | Source: Other Acute Inpatient Hospital | Admitting: *Deleted

## 2014-09-15 DIAGNOSIS — N19 Unspecified kidney failure: Secondary | ICD-10-CM | POA: Diagnosis present

## 2014-09-15 DIAGNOSIS — E119 Type 2 diabetes mellitus without complications: Secondary | ICD-10-CM | POA: Diagnosis not present

## 2014-09-15 DIAGNOSIS — Z992 Dependence on renal dialysis: Secondary | ICD-10-CM | POA: Insufficient documentation

## 2014-09-15 DIAGNOSIS — T82590S Other mechanical complication of surgically created arteriovenous fistula, sequela: Secondary | ICD-10-CM

## 2014-09-15 DIAGNOSIS — Y832 Surgical operation with anastomosis, bypass or graft as the cause of abnormal reaction of the patient, or of later complication, without mention of misadventure at the time of the procedure: Secondary | ICD-10-CM | POA: Diagnosis not present

## 2014-09-15 DIAGNOSIS — N186 End stage renal disease: Secondary | ICD-10-CM | POA: Diagnosis not present

## 2014-09-15 DIAGNOSIS — T82898A Other specified complication of vascular prosthetic devices, implants and grafts, initial encounter: Secondary | ICD-10-CM | POA: Insufficient documentation

## 2014-09-15 DIAGNOSIS — T82868A Thrombosis of vascular prosthetic devices, implants and grafts, initial encounter: Secondary | ICD-10-CM

## 2014-09-15 DIAGNOSIS — Z9911 Dependence on respirator [ventilator] status: Secondary | ICD-10-CM | POA: Insufficient documentation

## 2014-09-15 DIAGNOSIS — Z930 Tracheostomy status: Secondary | ICD-10-CM | POA: Diagnosis not present

## 2014-09-15 LAB — GLUCOSE, CAPILLARY: Glucose-Capillary: 74 mg/dL (ref 70–99)

## 2014-09-15 MED ORDER — IOHEXOL 300 MG/ML  SOLN
100.0000 mL | Freq: Once | INTRAMUSCULAR | Status: AC | PRN
  Administered 2014-09-15: 40 mL via INTRAVENOUS

## 2014-09-15 MED ORDER — CHLORHEXIDINE GLUCONATE 4 % EX LIQD
CUTANEOUS | Status: AC
  Filled 2014-09-15: qty 15

## 2014-09-15 MED ORDER — FENTANYL CITRATE 0.05 MG/ML IJ SOLN
INTRAMUSCULAR | Status: AC
  Filled 2014-09-15: qty 2

## 2014-09-15 MED ORDER — VANCOMYCIN HCL IN DEXTROSE 1-5 GM/200ML-% IV SOLN
1000.0000 mg | Freq: Once | INTRAVENOUS | Status: AC
  Administered 2014-09-15: 1000 mg via INTRAVENOUS
  Filled 2014-09-15: qty 200

## 2014-09-15 MED ORDER — HEPARIN SODIUM (PORCINE) 1000 UNIT/ML IJ SOLN
INTRAMUSCULAR | Status: AC
  Filled 2014-09-15: qty 1

## 2014-09-15 MED ORDER — LIDOCAINE HCL 1 % IJ SOLN
INTRAMUSCULAR | Status: AC
  Filled 2014-09-15: qty 20

## 2014-09-15 MED ORDER — MIDAZOLAM HCL 2 MG/2ML IJ SOLN
INTRAMUSCULAR | Status: AC
  Filled 2014-09-15: qty 2

## 2014-09-15 NOTE — Sedation Documentation (Signed)
[facility] called for transport back to [facility].

## 2014-09-15 NOTE — Sedation Documentation (Addendum)
Shuntogram done, DrCullough reviewed will proceed with angioplasty and possible stent placement

## 2014-09-15 NOTE — Sedation Documentation (Addendum)
CBG of 74 reported to [facility] RN, she states they will restart tube feeding on arrival, family has returned to [facility].  Awaiting ambulance, dressing CDI, VSS.  Report off to Phillis KnackPeggy Troha, RN

## 2014-09-15 NOTE — Sedation Documentation (Signed)
Catheter pulled back and flushed once lying flat on table.  DR Archer AsaMcCullough reviewed.

## 2014-09-15 NOTE — Sedation Documentation (Signed)
In IR now, Techs unable to pull off heparin, prepping for cath exchange.

## 2014-09-15 NOTE — Sedation Documentation (Signed)
Awaiting MD 

## 2014-09-15 NOTE — Sedation Documentation (Signed)
Received from [facility].  Vented, Resp Tx here.  Pt opens eyes to name.  Events explained to pt.

## 2014-09-15 NOTE — Sedation Documentation (Signed)
No sedation needed for this procedure per DR Archer AsaMcCullough.  Pt quite somnolent, does not always respond to voice.  Responded best to daughters voice earlier in nurses station.

## 2014-09-15 NOTE — Progress Notes (Signed)
Pt placed on our ventilator for IR procedure, will continue to monitor pt.

## 2014-09-15 NOTE — Sedation Documentation (Signed)
Telephone consent obtained for angioplasty and possible stent placement of tight are in graft.

## 2014-09-15 NOTE — Sedation Documentation (Signed)
Pt tolerating procedure well, no grimacing, somnolent, VSS.

## 2015-12-01 ENCOUNTER — Encounter (HOSPITAL_COMMUNITY): Payer: Self-pay | Admitting: *Deleted

## 2015-12-01 NOTE — Progress Notes (Signed)
[facility] She has a trach collar, has been on a vent in the past. Per pt's nurse, Wilnette Kaleshelma, pt has flat effect. Will open eyes with sound but does not follow or respond when she is spoken to. Pt's daughter, Arman Bogusngela Proctor will be meeting pt here, she is her mother's healthcare power of attorney. Reviewed pt's medical and surgical history with pt's nurse, Wilnette Kaleshelma. She was given pre-op instructions according to Pre-op call checklist. Pt will be arriving via [facility].

## 2015-12-02 ENCOUNTER — Encounter (HOSPITAL_COMMUNITY): Payer: Self-pay | Admitting: Anesthesiology

## 2015-12-02 ENCOUNTER — Ambulatory Visit (HOSPITAL_COMMUNITY)
Admission: RE | Admit: 2015-12-02 | Discharge: 2015-12-02 | Disposition: A | Discharge status: Skilled Nursing Facility | Payer: Medicare Other | Source: Ambulatory Visit | Attending: Cardiovascular Disease | Admitting: Cardiovascular Disease

## 2015-12-02 ENCOUNTER — Encounter (HOSPITAL_COMMUNITY)
Admission: RE | Disposition: A | Discharge status: Skilled Nursing Facility | Payer: Self-pay | Source: Ambulatory Visit | Attending: Cardiovascular Disease

## 2015-12-02 ENCOUNTER — Ambulatory Visit (HOSPITAL_BASED_OUTPATIENT_CLINIC_OR_DEPARTMENT_OTHER): Payer: Medicare Other

## 2015-12-02 ENCOUNTER — Ambulatory Visit (HOSPITAL_COMMUNITY): Payer: Medicare Other | Admitting: Certified Registered"

## 2015-12-02 DIAGNOSIS — Z87891 Personal history of nicotine dependence: Secondary | ICD-10-CM | POA: Insufficient documentation

## 2015-12-02 DIAGNOSIS — I340 Nonrheumatic mitral (valve) insufficiency: Secondary | ICD-10-CM

## 2015-12-02 DIAGNOSIS — G40909 Epilepsy, unspecified, not intractable, without status epilepticus: Secondary | ICD-10-CM | POA: Diagnosis not present

## 2015-12-02 DIAGNOSIS — I120 Hypertensive chronic kidney disease with stage 5 chronic kidney disease or end stage renal disease: Secondary | ICD-10-CM | POA: Insufficient documentation

## 2015-12-02 DIAGNOSIS — I441 Atrioventricular block, second degree: Secondary | ICD-10-CM | POA: Insufficient documentation

## 2015-12-02 DIAGNOSIS — Z8614 Personal history of Methicillin resistant Staphylococcus aureus infection: Secondary | ICD-10-CM | POA: Diagnosis not present

## 2015-12-02 DIAGNOSIS — R011 Cardiac murmur, unspecified: Secondary | ICD-10-CM

## 2015-12-02 DIAGNOSIS — J9610 Chronic respiratory failure, unspecified whether with hypoxia or hypercapnia: Secondary | ICD-10-CM | POA: Insufficient documentation

## 2015-12-02 DIAGNOSIS — Z880 Allergy status to penicillin: Secondary | ICD-10-CM | POA: Insufficient documentation

## 2015-12-02 DIAGNOSIS — Z79899 Other long term (current) drug therapy: Secondary | ICD-10-CM | POA: Diagnosis not present

## 2015-12-02 DIAGNOSIS — I517 Cardiomegaly: Secondary | ICD-10-CM | POA: Diagnosis not present

## 2015-12-02 DIAGNOSIS — A491 Streptococcal infection, unspecified site: Secondary | ICD-10-CM | POA: Diagnosis not present

## 2015-12-02 DIAGNOSIS — R7881 Bacteremia: Secondary | ICD-10-CM | POA: Diagnosis not present

## 2015-12-02 DIAGNOSIS — Z794 Long term (current) use of insulin: Secondary | ICD-10-CM | POA: Diagnosis not present

## 2015-12-02 DIAGNOSIS — G4731 Primary central sleep apnea: Secondary | ICD-10-CM | POA: Insufficient documentation

## 2015-12-02 DIAGNOSIS — N2581 Secondary hyperparathyroidism of renal origin: Secondary | ICD-10-CM | POA: Insufficient documentation

## 2015-12-02 DIAGNOSIS — Z8673 Personal history of transient ischemic attack (TIA), and cerebral infarction without residual deficits: Secondary | ICD-10-CM | POA: Diagnosis not present

## 2015-12-02 DIAGNOSIS — I472 Ventricular tachycardia: Secondary | ICD-10-CM | POA: Diagnosis not present

## 2015-12-02 DIAGNOSIS — Z992 Dependence on renal dialysis: Secondary | ICD-10-CM | POA: Diagnosis not present

## 2015-12-02 DIAGNOSIS — I4892 Unspecified atrial flutter: Secondary | ICD-10-CM

## 2015-12-02 DIAGNOSIS — Z9049 Acquired absence of other specified parts of digestive tract: Secondary | ICD-10-CM | POA: Insufficient documentation

## 2015-12-02 DIAGNOSIS — N186 End stage renal disease: Secondary | ICD-10-CM | POA: Diagnosis not present

## 2015-12-02 DIAGNOSIS — E119 Type 2 diabetes mellitus without complications: Secondary | ICD-10-CM | POA: Insufficient documentation

## 2015-12-02 DIAGNOSIS — G4730 Sleep apnea, unspecified: Secondary | ICD-10-CM | POA: Insufficient documentation

## 2015-12-02 DIAGNOSIS — I421 Obstructive hypertrophic cardiomyopathy: Secondary | ICD-10-CM

## 2015-12-02 DIAGNOSIS — B952 Enterococcus as the cause of diseases classified elsewhere: Secondary | ICD-10-CM

## 2015-12-02 HISTORY — DX: Respiratory failure, unspecified, unspecified whether with hypoxia or hypercapnia: J96.90

## 2015-12-02 HISTORY — DX: Resistance to multiple antimicrobial drugs: Z16.35

## 2015-12-02 HISTORY — PX: TEE WITHOUT CARDIOVERSION: SHX5443

## 2015-12-02 HISTORY — DX: Reserved for concepts with insufficient information to code with codable children: IMO0002

## 2015-12-02 LAB — SURGICAL PCR SCREEN
MRSA, PCR: POSITIVE — AB
STAPHYLOCOCCUS AUREUS: POSITIVE — AB

## 2015-12-02 LAB — POCT I-STAT 4, (NA,K, GLUC, HGB,HCT)
Glucose, Bld: 117 mg/dL — ABNORMAL HIGH (ref 65–99)
HEMATOCRIT: 38 % (ref 36.0–46.0)
Hemoglobin: 12.9 g/dL (ref 12.0–15.0)
Potassium: 3.8 mmol/L (ref 3.5–5.1)
SODIUM: 135 mmol/L (ref 135–145)

## 2015-12-02 LAB — GLUCOSE, CAPILLARY
GLUCOSE-CAPILLARY: 117 mg/dL — AB (ref 65–99)
Glucose-Capillary: 100 mg/dL — ABNORMAL HIGH (ref 65–99)
Glucose-Capillary: 91 mg/dL (ref 65–99)

## 2015-12-02 SURGERY — ECHOCARDIOGRAM, TRANSESOPHAGEAL
Anesthesia: General | Site: Esophagus

## 2015-12-02 MED ORDER — ALBUMIN HUMAN 5 % IV SOLN
INTRAVENOUS | Status: DC | PRN
  Administered 2015-12-02: 11:00:00 via INTRAVENOUS

## 2015-12-02 MED ORDER — ESMOLOL HCL 100 MG/10ML IV SOLN
INTRAVENOUS | Status: DC | PRN
  Administered 2015-12-02 (×3): 20 mg via INTRAVENOUS

## 2015-12-02 MED ORDER — MUPIROCIN 2 % EX OINT
TOPICAL_OINTMENT | CUTANEOUS | Status: AC
  Administered 2015-12-02: 1 via TOPICAL
  Filled 2015-12-02: qty 22

## 2015-12-02 MED ORDER — SODIUM CHLORIDE 0.9 % IV SOLN
INTRAVENOUS | Status: DC
  Administered 2015-12-02: 10:00:00 via INTRAVENOUS

## 2015-12-02 MED ORDER — PHENYLEPHRINE HCL 10 MG/ML IJ SOLN
INTRAMUSCULAR | Status: DC | PRN
  Administered 2015-12-02 (×4): 80 ug via INTRAVENOUS

## 2015-12-02 MED ORDER — PROPOFOL 10 MG/ML IV BOLUS
INTRAVENOUS | Status: DC | PRN
  Administered 2015-12-02: 50 mg via INTRAVENOUS

## 2015-12-02 MED ORDER — MUPIROCIN 2 % EX OINT
1.0000 "application " | TOPICAL_OINTMENT | Freq: Once | CUTANEOUS | Status: AC
  Administered 2015-12-02: 1 via TOPICAL

## 2015-12-02 NOTE — Transfer of Care (Signed)
Immediate Anesthesia Transfer of Care Note  Patient: [name]  Procedure(s) Performed: Procedure(s): TRANSESOPHAGEAL ECHOCARDIOGRAM (TEE) (N/A)  Patient Location: PACU  Anesthesia Type:General  Level of Consciousness: awake and responds to stimulation  Airway & Oxygen Therapy: Patient Spontanous Breathing and Patient connected to tracheostomy mask oxygen  Post-op Assessment: Report given to RN and Post -op Vital signs reviewed and stable  Post vital signs: Reviewed and stable  Last Vitals:  Filed Vitals:   12/02/15 0747  BP: 95/75  Pulse: 84  Temp: 37.1 C  Resp: 14    Complications: No apparent anesthesia complications

## 2015-12-02 NOTE — H&P (Signed)
Reason for Consult: VRE bacteremia, recurrent; suspicion of endocarditis  Requesting Physician: Ivory Broad ([facility])   HPI: This is a 70 y.o. female with a past medical history significant for recurrent enterococcal bacteremia, resident at [facility]. She has ESRD on HD, severe obesity, central sleep apnea, chronic respiratory failure s/p tracheostomy, PEG tube, DM, previous stroke and seizures. She is referred for TEE to exclude endocarditis.  History is obtained from the family and chart. The patient is unable to communicate. Her physical exam is severely limited by her body habitus. ROS cannot be obtained. No history of swallowing difficulty or esophageal disease.  PMHx:  Past Medical History  Diagnosis Date  . Hypertension   . Stroke (HCC)   . Subdural hematoma (HCC)   . Seizure disorder (HCC)   . Anemia   . Secondary hyperparathyroidism (HCC)   . Cataract   . MRSA (methicillin resistant Staphylococcus aureus)   . C. difficile colitis   . MRSA bacteremia   . Central sleep apnea   . Ventricular tachycardia (HCC)   . ESRD (end stage renal disease) (HCC)     dialysis pt   . MDRO (multiple drug resistant organisms) resistance   . Diabetes mellitus     type 2  . Respiratory failure (HCC)     on trach collar -    Past Surgical History  Procedure Laterality Date  . Cholecystectomy    . Vascular surgery    . Thyroidectomy    . Eye surgery    . Craniotomy    . Thrombectomy and revision of arterioventous (av) goretex  graft Right 07/02/2014    Procedure: THROMBECTOMY AND REVISION OF VENOUS SIDE OF ARTERIOVENTOUS (AV) GORETEX  GRAFT-RIGHT THIGH;  Surgeon: Chuck Hint, MD;  Location: [facility] OR;  Service: Vascular;  Laterality: Right;    FAMHx: Family History  Problem Relation Age of Onset  . Hypertension Mother   . Diabetes Mellitus II Mother   . Stroke Father   . Hypertension Father     SOCHx:  reports that she quit smoking about 17 years ago. Her  smoking use included Cigarettes. She has a 30 pack-year smoking history. She does not have any smokeless tobacco history on file. She reports that she does not drink alcohol or use illicit drugs.  ALLERGIES: Allergies  Allergen Reactions  . Penicillins Hives and Rash    Has patient had a PCN reaction causing immediate rash, facial/tongue/throat swelling, SOB or lightheadedness with hypotension: Yes Has patient had a PCN reaction causing severe rash involving mucus membranes or skin necrosis: No Has patient had a PCN reaction that required hospitalization No Has patient had a PCN reaction occurring within the last 10 years: No If all of the above answers are "NO", then may proceed with Cephalosporin use.     ROS: Review of systems not obtained due to patient factors.  HOME MEDICATIONS: Prescriptions prior to admission  Medication Sig Dispense Refill Last Dose  . acetaminophen (TYLENOL) 160 MG/5ML solution Place 650 mg into feeding tube every 6 (six) hours as needed for mild pain or fever.   Past Month at Unknown time  . ALBUMIN, HUMAN, IV Inject 25 g into the vein 3 (three) times a week. IVPB   12/01/2015 at Unknown time  . Amino Acids-Protein Hydrolys (FEEDING SUPPLEMENT, PRO-STAT SUGAR FREE 64,) LIQD Place 30 mLs into feeding tube 3 (three) times daily with meals.     . chlorhexidine (PERIDEX) 0.12 % solution Place 15  mLs onto teeth every 12 (twelve) hours.    Past Week at Unknown time  . Darbepoetin Alfa (ARANESP) 100 MCG/0.5ML SOSY injection Inject 100 mcg into the skin every 7 (seven) days.   Past Week at Unknown time  . dexamethasone (DECADRON) 2 MG tablet Give 2 mg by tube 2 (two) times daily.    12/01/2015 at Unknown time  . insulin detemir (LEVEMIR) 100 UNIT/ML injection Inject 10 Units into the skin at bedtime.   12/01/2015 at Unknown time  . insulin regular (NOVOLIN R,HUMULIN R) 100 units/mL injection Inject 2 Units into the skin every 6 (six) hours.   12/02/2015 at Unknown time  .  ipratropium-albuterol (DUONEB) 0.5-2.5 (3) MG/3ML SOLN Take 3 mLs by nebulization every 4 (four) hours as needed (for shortness of breath).    12/01/2015 at Unknown time  . levETIRAcetam (KEPPRA) 100 MG/ML solution Place 250 mg into feeding tube every 12 (twelve) hours.   12/01/2015 at Unknown time  . linezolid (ZYVOX) 600 MG tablet Give 600 mg by tube 2 (two) times daily.   12/01/2015 at Unknown time  . LORazepam (ATIVAN) 0.5 MG tablet Give 0.5 mg by tube every 8 (eight) hours as needed for anxiety or sedation.   Past Month at Unknown time  . MENTHOL-ZINC OXIDE EX Apply 1 application topically every 6 (six) hours.   12/01/2015 at Unknown time  . midodrine (PROAMATINE) 5 MG tablet 10 mg by Feeding Tube route every 12 (twelve) hours.    12/01/2015 at Unknown time  . NONFORMULARY OR COMPOUNDED ITEM Inject 1.6 mg into the vein 3 (three) times a week. Gentamicin 1.6mg /64mL Anticoagulant Citrate Solution Flush three times weekly as needed     . Nutritional Supplements (NOVASOURCE RENAL) LIQD 45 mL/hr by Feeding Tube route continuous.   09/07/2014 at Unknown time  . pantoprazole (PROTONIX) 40 MG tablet Give 40 mg by tube daily.   12/01/2015 at Unknown time  . paricalcitol (ZEMPLAR) 2 MCG/ML injection Inject 5 mcg into the vein 3 (three) times a week.   12/01/2015 at Unknown time  . psyllium (METAMUCIL) 58.6 % packet Give 1 packet by tube 2 (two) times daily.   12/01/2015 at Unknown time  . sodium chloride 0.9 % infusion Inject 250 mLs into the vein as needed (if IV carrier fluid needed.). (Patient taking differently: Inject 1,000 mLs into the vein 3 (three) times a week. ) 100 mL 0 12/01/2015 at Unknown time  . STERILE WATER PO 100 mLs by Feeding Tube route every 6 (six) hours.   09/07/2014 at Unknown time  . Vitamin D, Ergocalciferol, (DRISDOL) 50000 units CAPS capsule Give 50,000 Units by tube every 7 (seven) days.   11/28/2015  . oxyCODONE-acetaminophen (PERCOCET/ROXICET) 5-325 MG tablet Give 1 tablet by tube every 4 (four)  hours as needed for moderate pain or severe pain.   More than a month at Unknown time  . polyethylene glycol (MIRALAX / GLYCOLAX) packet 17 g by Feeding Tube route daily as needed for mild constipation. As needed   More than a month at Unknown time    HOSPITAL MEDICATIONS: I have reviewed the patient's current medications. Prior to Admission:  Prescriptions prior to admission  Medication Sig Dispense Refill Last Dose  . acetaminophen (TYLENOL) 160 MG/5ML solution Place 650 mg into feeding tube every 6 (six) hours as needed for mild pain or fever.   Past Month at Unknown time  . ALBUMIN, HUMAN, IV Inject 25 g into the vein 3 (three) times a week.  IVPB   12/01/2015 at Unknown time  . Amino Acids-Protein Hydrolys (FEEDING SUPPLEMENT, PRO-STAT SUGAR FREE 64,) LIQD Place 30 mLs into feeding tube 3 (three) times daily with meals.     . chlorhexidine (PERIDEX) 0.12 % solution Place 15 mLs onto teeth every 12 (twelve) hours.    Past Week at Unknown time  . Darbepoetin Alfa (ARANESP) 100 MCG/0.5ML SOSY injection Inject 100 mcg into the skin every 7 (seven) days.   Past Week at Unknown time  . dexamethasone (DECADRON) 2 MG tablet Give 2 mg by tube 2 (two) times daily.    12/01/2015 at Unknown time  . insulin detemir (LEVEMIR) 100 UNIT/ML injection Inject 10 Units into the skin at bedtime.   12/01/2015 at Unknown time  . insulin regular (NOVOLIN R,HUMULIN R) 100 units/mL injection Inject 2 Units into the skin every 6 (six) hours.   12/02/2015 at Unknown time  . ipratropium-albuterol (DUONEB) 0.5-2.5 (3) MG/3ML SOLN Take 3 mLs by nebulization every 4 (four) hours as needed (for shortness of breath).    12/01/2015 at Unknown time  . levETIRAcetam (KEPPRA) 100 MG/ML solution Place 250 mg into feeding tube every 12 (twelve) hours.   12/01/2015 at Unknown time  . linezolid (ZYVOX) 600 MG tablet Give 600 mg by tube 2 (two) times daily.   12/01/2015 at Unknown time  . LORazepam (ATIVAN) 0.5 MG tablet Give 0.5 mg by tube every 8  (eight) hours as needed for anxiety or sedation.   Past Month at Unknown time  . MENTHOL-ZINC OXIDE EX Apply 1 application topically every 6 (six) hours.   12/01/2015 at Unknown time  . midodrine (PROAMATINE) 5 MG tablet 10 mg by Feeding Tube route every 12 (twelve) hours.    12/01/2015 at Unknown time  . NONFORMULARY OR COMPOUNDED ITEM Inject 1.6 mg into the vein 3 (three) times a week. Gentamicin 1.6mg /775mL Anticoagulant Citrate Solution Flush three times weekly as needed     . Nutritional Supplements (NOVASOURCE RENAL) LIQD 45 mL/hr by Feeding Tube route continuous.   09/07/2014 at Unknown time  . pantoprazole (PROTONIX) 40 MG tablet Give 40 mg by tube daily.   12/01/2015 at Unknown time  . paricalcitol (ZEMPLAR) 2 MCG/ML injection Inject 5 mcg into the vein 3 (three) times a week.   12/01/2015 at Unknown time  . psyllium (METAMUCIL) 58.6 % packet Give 1 packet by tube 2 (two) times daily.   12/01/2015 at Unknown time  . sodium chloride 0.9 % infusion Inject 250 mLs into the vein as needed (if IV carrier fluid needed.). (Patient taking differently: Inject 1,000 mLs into the vein 3 (three) times a week. ) 100 mL 0 12/01/2015 at Unknown time  . STERILE WATER PO 100 mLs by Feeding Tube route every 6 (six) hours.   09/07/2014 at Unknown time  . Vitamin D, Ergocalciferol, (DRISDOL) 50000 units CAPS capsule Give 50,000 Units by tube every 7 (seven) days.   11/28/2015  . oxyCODONE-acetaminophen (PERCOCET/ROXICET) 5-325 MG tablet Give 1 tablet by tube every 4 (four) hours as needed for moderate pain or severe pain.   More than a month at Unknown time  . polyethylene glycol (MIRALAX / GLYCOLAX) packet 17 g by Feeding Tube route daily as needed for mild constipation. As needed   More than a month at Unknown time    VITALS: Blood pressure 95/75, pulse 84, temperature 98.7 F (37.1 C), temperature source Oral, resp. rate 14, SpO2 98 %.  PHYSICAL EXAM:  General: Alert, noncommunicative, no distress  Head: no evidence of  trauma, PERRL, EOMI, no exophtalmos or lid lag, no myxedema, no xanthelasma; normal ears, nose and oropharynx Neck: tracheostomy site clean, unable to see jugular venous pulsations and no hepatojugular reflux; brisk carotid pulses without delay and bilateral faint carotid bruits Chest: clear to auscultation, no signs of consolidation by percussion or palpation, normal fremitus, symmetrical and full respiratory excursions Cardiovascular: cannot locate the apical impulse, regular rhythm, normal first heart sound and  second heart sound, no rubs or gallops, 2-3/6 systolic murmur up and down both sides of the sternum, possible separate apical holosystolic murmur, no diastolic murmur Abdomen: PEG site OK, obesity limits exam, normal bowel sounds Extremities: no clubbing, cyanosis;  1+ edema; 2+ radial, ulnar  bilaterally; 2+ posterior tibial and dorsalis pedis pulses; Neurological: grossly nonfocal, exam limited   LABS  CBC  Recent Labs  12/02/15 0854  HGB 12.9  HCT 38.0   Basic Metabolic Panel  Recent Labs  12/02/15 0854  NA 135  K 3.8  GLUCOSE 117*    IMAGING: No results found.  ECG: NSR, LVH, possibly also RVH versus septal hypertrophy  TELEMETRY: NSR with PACs  IMPRESSION: 1. Recurrent bacteremia, for TEE. This procedure has been fully reviewed with the patient's daughter and written informed consent has been obtained.  2. Hypertrophic (obstructive?) cardiomyopathy by exam 3. History of CVA 4. ESRD  RECOMMENDATION: 1. TEE  Time Spent Directly with Patient: 30 minutes  Thurmon Fair, MD, [facility] [phone] office [phone] pager   12/02/2015, 10:57 AM

## 2015-12-02 NOTE — Progress Notes (Signed)
Echocardiogram Echocardiogram Transesophageal has been performed.  [name], [name] 12/02/2015, 11:23 AM

## 2015-12-02 NOTE — Anesthesia Postprocedure Evaluation (Signed)
Anesthesia Post Note  Patient: [name]  Procedure(s) Performed: Procedure(s) (LRB): TRANSESOPHAGEAL ECHOCARDIOGRAM (TEE) (N/A)  Patient location during evaluation: PACU Anesthesia Type: General Level of consciousness: sedated and patient cooperative Pain management: pain level controlled Vital Signs Assessment: post-procedure vital signs reviewed and stable Respiratory status: spontaneous breathing Cardiovascular status: stable Anesthetic complications: no    Last Vitals:  Filed Vitals:   12/02/15 1110 12/02/15 1130  BP:    Pulse:    Temp: 36.9 C   Resp:  38    Last Pain: There were no vitals filed for this visit.               Lewie LoronJohn Caedan Sumler

## 2015-12-02 NOTE — Anesthesia Preprocedure Evaluation (Addendum)
Anesthesia Evaluation  Patient identified by MRN, date of birth, ID band Patient awake    Reviewed: Allergy & Precautions, H&P , NPO status , Patient's Chart, lab work & pertinent test results  History of Anesthesia Complications Negative for: history of anesthetic complications  Airway Mallampati: II  TM Distance: >3 FB     Dental  (+) Edentulous Upper, Edentulous Lower   Pulmonary pneumonia, former smoker,     + decreased breath sounds      Cardiovascular hypertension, Pt. on medications  Rhythm:Regular Rate:Normal     Neuro/Psych CVA, Residual Symptoms    GI/Hepatic negative GI ROS, Neg liver ROS,   Endo/Other  diabetes, Type 2, Insulin Dependent  Renal/GU ESRF and DialysisRenal disease     Musculoskeletal   Abdominal   Peds  Hematology  (+) anemia ,   Anesthesia Other Findings   Reproductive/Obstetrics negative OB ROS                             Anesthesia Physical  Anesthesia Plan  ASA: IV  Anesthesia Plan: General   Post-op Pain Management:    Induction: Inhalational and Intravenous  Airway Management Planned: Tracheostomy  Additional Equipment:   Intra-op Plan:   Post-operative Plan: Post-operative intubation/ventilation  Informed Consent: I have reviewed the patients History and Physical, chart, labs and discussed the procedure including the risks, benefits and alternatives for the proposed anesthesia with the patient or authorized representative who has indicated his/her understanding and acceptance.     Plan Discussed with: CRNA  Anesthesia Plan Comments:        Anesthesia Quick Evaluation

## 2015-12-02 NOTE — Progress Notes (Signed)
Diatek present/capped r groin

## 2015-12-02 NOTE — Op Note (Addendum)
INDICATIONS: bacteremia  PROCEDURE:   Informed consent was obtained prior to the procedure. The risks, benefits and alternatives for the procedure were discussed and the patient's daughter comprehended these risks.  Risks include, but are not limited to, cough, sore throat, vomiting, nausea, somnolence, esophageal and stomach trauma or perforation, bleeding, low blood pressure, aspiration, pneumonia, infection, trauma to the teeth and death.    After a procedural time-out, the patient was given general anesthesia (Dr. Renold DonGermeroth).   The transesophageal probe was inserted in the esophagus and stomach without difficulty and multiple views were obtained.  The patient was kept under observation until the patient left the procedure room.  The patient left the procedure room in stable condition.   Agitated microbubble saline contrast was not administered.  COMPLICATIONS:   Brief episodes of atrial flutter with 2:1 AV block were noted during the procedure, resolved with IV esmolol and volume.  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 Severe LVH EF>80% No vegetations seen on the valves or the ventricular septum. [facility] suggestive of hypovolemia  RECOMMENDATIONS:   May need beta blockers, but first suggest allowing a higher "dry weight" after hemodialysis as this may be enough to reduce LV obstruction and may preclude need for midodrine. Reevaluate by transthoracic echo at higher volume status. No signs of endocarditis by TEE.  In view of episodes of atrial flutter and history of CVA, suggest  30 day event monitor. She may require chronic anticoagulation if the arrhythmia is recurring (versus just procedure related).  Time Spent Directly with the Patient:  60 minutes   [name] 12/02/2015, 11:08 AM  Discussed the findings with her daughter. The patient has had atrial fibrillation numerous times in the  past. She cannot receive anticoagulation due to a history of intracranial hemorrhage. Arrhythmia has not been a symptomatic event in a while.   Thurmon FairMihai Breeley Bischof, MD, [facility] [phone] office [phone] pager

## 2015-12-05 ENCOUNTER — Encounter (HOSPITAL_COMMUNITY): Payer: Self-pay | Admitting: Cardiovascular Disease

## 2015-12-06 ENCOUNTER — Other Ambulatory Visit: Payer: Self-pay | Admitting: Physician Assistant

## 2015-12-06 ENCOUNTER — Other Ambulatory Visit (HOSPITAL_COMMUNITY): Payer: Self-pay | Admitting: Internal Medicine

## 2015-12-06 DIAGNOSIS — N186 End stage renal disease: Secondary | ICD-10-CM

## 2015-12-07 ENCOUNTER — Ambulatory Visit (HOSPITAL_COMMUNITY)
Admission: RE | Admit: 2015-12-07 | Discharge: 2015-12-07 | Disposition: A | Discharge status: Home / Self Care | Payer: Medicare Other | Source: Ambulatory Visit | Attending: Internal Medicine | Admitting: Internal Medicine

## 2015-12-07 ENCOUNTER — Other Ambulatory Visit (HOSPITAL_COMMUNITY): Payer: Self-pay | Admitting: Internal Medicine

## 2015-12-07 ENCOUNTER — Encounter (HOSPITAL_COMMUNITY): Payer: Self-pay

## 2015-12-07 DIAGNOSIS — I120 Hypertensive chronic kidney disease with stage 5 chronic kidney disease or end stage renal disease: Secondary | ICD-10-CM | POA: Insufficient documentation

## 2015-12-07 DIAGNOSIS — Z992 Dependence on renal dialysis: Secondary | ICD-10-CM | POA: Insufficient documentation

## 2015-12-07 DIAGNOSIS — D649 Anemia, unspecified: Secondary | ICD-10-CM | POA: Insufficient documentation

## 2015-12-07 DIAGNOSIS — Z930 Tracheostomy status: Secondary | ICD-10-CM | POA: Diagnosis not present

## 2015-12-07 DIAGNOSIS — E1122 Type 2 diabetes mellitus with diabetic chronic kidney disease: Secondary | ICD-10-CM | POA: Diagnosis not present

## 2015-12-07 DIAGNOSIS — Z8673 Personal history of transient ischemic attack (TIA), and cerebral infarction without residual deficits: Secondary | ICD-10-CM | POA: Insufficient documentation

## 2015-12-07 DIAGNOSIS — Z87891 Personal history of nicotine dependence: Secondary | ICD-10-CM | POA: Diagnosis not present

## 2015-12-07 DIAGNOSIS — N186 End stage renal disease: Secondary | ICD-10-CM | POA: Diagnosis present

## 2015-12-07 DIAGNOSIS — I82291 Chronic embolism and thrombosis of other thoracic veins: Secondary | ICD-10-CM | POA: Diagnosis not present

## 2015-12-07 DIAGNOSIS — I472 Ventricular tachycardia: Secondary | ICD-10-CM | POA: Insufficient documentation

## 2015-12-07 DIAGNOSIS — Z880 Allergy status to penicillin: Secondary | ICD-10-CM | POA: Insufficient documentation

## 2015-12-07 DIAGNOSIS — Z794 Long term (current) use of insulin: Secondary | ICD-10-CM | POA: Insufficient documentation

## 2015-12-07 DIAGNOSIS — N2581 Secondary hyperparathyroidism of renal origin: Secondary | ICD-10-CM | POA: Diagnosis not present

## 2015-12-07 DIAGNOSIS — Z8249 Family history of ischemic heart disease and other diseases of the circulatory system: Secondary | ICD-10-CM | POA: Insufficient documentation

## 2015-12-07 DIAGNOSIS — G4731 Primary central sleep apnea: Secondary | ICD-10-CM | POA: Insufficient documentation

## 2015-12-07 DIAGNOSIS — Z8614 Personal history of Methicillin resistant Staphylococcus aureus infection: Secondary | ICD-10-CM | POA: Insufficient documentation

## 2015-12-07 DIAGNOSIS — G40909 Epilepsy, unspecified, not intractable, without status epilepticus: Secondary | ICD-10-CM | POA: Insufficient documentation

## 2015-12-07 MED ORDER — HEPARIN SODIUM (PORCINE) 1000 UNIT/ML IJ SOLN
INTRAMUSCULAR | Status: AC
  Filled 2015-12-07: qty 1

## 2015-12-07 MED ORDER — FENTANYL CITRATE (PF) 100 MCG/2ML IJ SOLN
INTRAMUSCULAR | Status: AC | PRN
  Administered 2015-12-07 (×2): 12.5 ug via INTRAVENOUS

## 2015-12-07 MED ORDER — MIDAZOLAM HCL 2 MG/2ML IJ SOLN
INTRAMUSCULAR | Status: AC | PRN
  Administered 2015-12-07: 0.5 mg via INTRAVENOUS

## 2015-12-07 MED ORDER — VANCOMYCIN HCL IN DEXTROSE 1-5 GM/200ML-% IV SOLN
1000.0000 mg | Freq: Once | INTRAVENOUS | Status: AC
  Administered 2015-12-07: 1000 mg via INTRAVENOUS
  Filled 2015-12-07: qty 200

## 2015-12-07 MED ORDER — SODIUM CHLORIDE 0.9 % IV SOLN
INTRAVENOUS | Status: AC | PRN
  Administered 2015-12-07: 10 mL/h via INTRAVENOUS

## 2015-12-07 MED ORDER — MIDAZOLAM HCL 2 MG/2ML IJ SOLN
INTRAMUSCULAR | Status: AC
  Filled 2015-12-07: qty 2

## 2015-12-07 MED ORDER — IOHEXOL 300 MG/ML  SOLN
50.0000 mL | Freq: Once | INTRAMUSCULAR | Status: AC | PRN
  Administered 2015-12-07: 15 mL via INTRAVENOUS

## 2015-12-07 MED ORDER — FENTANYL CITRATE (PF) 100 MCG/2ML IJ SOLN
INTRAMUSCULAR | Status: AC
  Filled 2015-12-07: qty 2

## 2015-12-07 MED ORDER — LIDOCAINE HCL 1 % IJ SOLN
INTRAMUSCULAR | Status: AC
  Filled 2015-12-07: qty 20

## 2015-12-07 NOTE — Sedation Documentation (Signed)
Changing to L groin site from L IJ

## 2015-12-07 NOTE — H&P (Signed)
Chief Complaint: Patient was seen in consultation today for placement of tunneled HD catheter at the request of Igwemezie,Benjamin  Referring Physician(s): Igwemezie,Benjamin  History of Present Illness: [name] is a 70 y.o. female with ESRD. She is at [facility] and currently receives HD via a right femoral HD cath, but this is not functioning well. Dr. Rayna Sexton would like a new tunneled IJ HD catheter placed. PMHx, chart, meds, labs reviewed from [facility]. Pt has tracheostomy and PEG tube, TF have been stopped. Due to altered mental status, plan discussed with family  Past Medical History  Diagnosis Date  . Hypertension   . Stroke (HCC)   . Subdural hematoma (HCC)   . Seizure disorder (HCC)   . Anemia   . Secondary hyperparathyroidism (HCC)   . Cataract   . MRSA (methicillin resistant Staphylococcus aureus)   . C. difficile colitis   . MRSA bacteremia   . Central sleep apnea   . Ventricular tachycardia (HCC)   . ESRD (end stage renal disease) (HCC)     dialysis pt   . MDRO (multiple drug resistant organisms) resistance   . Diabetes mellitus     type 2  . Respiratory failure (HCC)     on trach collar -     Past Surgical History  Procedure Laterality Date  . Cholecystectomy    . Vascular surgery    . Thyroidectomy    . Eye surgery    . Craniotomy    . Thrombectomy and revision of arterioventous (av) goretex  graft Right 07/02/2014    Procedure: THROMBECTOMY AND REVISION OF VENOUS SIDE OF ARTERIOVENTOUS (AV) GORETEX  GRAFT-RIGHT THIGH;  Surgeon: Chuck Hint, MD;  Location: [facility] OR;  Service: Vascular;  Laterality: Right;  . Tee without cardioversion N/A 12/02/2015    Procedure: TRANSESOPHAGEAL ECHOCARDIOGRAM (TEE);  Surgeon: Thurmon Fair, MD;  Location: [facility] OR;  Service: Cardiovascular;  Laterality: N/A;    Allergies: Penicillins  Medications: Prior to Admission medications   Medication Sig Start Date End Date Taking? Authorizing  Provider  acetaminophen (TYLENOL) 160 MG/5ML solution Place 650 mg into feeding tube every 6 (six) hours as needed for mild pain or fever.    Historical Provider, MD  ALBUMIN, HUMAN, IV Inject 25 g into the vein 3 (three) times a week. IVPB    Historical Provider, MD  Amino Acids-Protein Hydrolys (FEEDING SUPPLEMENT, PRO-STAT SUGAR FREE 64,) LIQD Place 30 mLs into feeding tube 3 (three) times daily with meals.    Historical Provider, MD  chlorhexidine (PERIDEX) 0.12 % solution Place 15 mLs onto teeth every 12 (twelve) hours.     Historical Provider, MD  Darbepoetin Alfa (ARANESP) 100 MCG/0.5ML SOSY injection Inject 100 mcg into the skin every 7 (seven) days.    Historical Provider, MD  dexamethasone (DECADRON) 2 MG tablet Give 2 mg by tube 2 (two) times daily.     Historical Provider, MD  insulin detemir (LEVEMIR) 100 UNIT/ML injection Inject 10 Units into the skin at bedtime.    Historical Provider, MD  insulin regular (NOVOLIN R,HUMULIN R) 100 units/mL injection Inject 2 Units into the skin every 6 (six) hours.    Historical Provider, MD  ipratropium-albuterol (DUONEB) 0.5-2.5 (3) MG/3ML SOLN Take 3 mLs by nebulization every 4 (four) hours as needed (for shortness of breath).     Historical Provider, MD  levETIRAcetam (KEPPRA) 100 MG/ML solution Place 250 mg into feeding tube every 12 (twelve) hours.    Historical Provider, MD  linezolid (ZYVOX) 600 MG tablet Give 600 mg by tube 2 (two) times daily.    Historical Provider, MD  LORazepam (ATIVAN) 0.5 MG tablet Give 0.5 mg by tube every 8 (eight) hours as needed for anxiety or sedation.    Historical Provider, MD  MENTHOL-ZINC OXIDE EX Apply 1 application topically every 6 (six) hours.    Historical Provider, MD  midodrine (PROAMATINE) 5 MG tablet 10 mg by Feeding Tube route every 12 (twelve) hours.     Historical Provider, MD  NONFORMULARY OR COMPOUNDED ITEM Inject 1.6 mg into the vein 3 (three) times a week. Gentamicin 1.6mg /715mL Anticoagulant  Citrate Solution Flush three times weekly as needed    Historical Provider, MD  Nutritional Supplements (NOVASOURCE RENAL) LIQD 45 mL/hr by Feeding Tube route continuous.    Historical Provider, MD  oxyCODONE-acetaminophen (PERCOCET/ROXICET) 5-325 MG tablet Give 1 tablet by tube every 4 (four) hours as needed for moderate pain or severe pain.    Historical Provider, MD  pantoprazole (PROTONIX) 40 MG tablet Give 40 mg by tube daily.    Historical Provider, MD  paricalcitol (ZEMPLAR) 2 MCG/ML injection Inject 5 mcg into the vein 3 (three) times a week.    Historical Provider, MD  polyethylene glycol (MIRALAX / GLYCOLAX) packet 17 g by Feeding Tube route daily as needed for mild constipation. As needed    Historical Provider, MD  psyllium (METAMUCIL) 58.6 % packet Give 1 packet by tube 2 (two) times daily.    Historical Provider, MD  sodium chloride 0.9 % infusion Inject 250 mLs into the vein as needed (if IV carrier fluid needed.). Patient taking differently: Inject 1,000 mLs into the vein 3 (three) times a week.  09/10/14   Vilinda BlanksWilliam S Minor, NP  STERILE WATER PO 100 mLs by Feeding Tube route every 6 (six) hours.    Historical Provider, MD  Vitamin D, Ergocalciferol, (DRISDOL) 50000 units CAPS capsule Give 50,000 Units by tube every 7 (seven) days.    Historical Provider, MD     Family History  Problem Relation Age of Onset  . Hypertension Mother   . Diabetes Mellitus II Mother   . Stroke Father   . Hypertension Father     Social History   Social History  . Marital Status: Single    Spouse Name: N/A  . Number of Children: N/A  . Years of Education: N/A   Social History Main Topics  . Smoking status: Former Smoker -- 1.50 packs/day for 20 years    Types: Cigarettes    Quit date: 11/26/1998  . Smokeless tobacco: None  . Alcohol Use: No  . Drug Use: No  . Sexual Activity: Not Asked   Other Topics Concern  . None   Social History Narrative     Review of Systems: A 12 point ROS  discussed and pertinent positives are indicated in the HPI above.  All other systems are negative.  Review of Systems  Vital Signs: There were no vitals taken for this visit.  Physical Exam  Constitutional: She appears well-developed. No distress.  HENT:  Head: Normocephalic.  Mouth/Throat: Oropharynx is clear and moist.  Neck: Normal range of motion.  Midline trach present, no issues  Cardiovascular: Normal rate, regular rhythm and normal heart sounds.   Pulmonary/Chest: Breath sounds normal. No respiratory distress.  Abdominal: Soft. There is no tenderness.  Musculoskeletal:  (R)femoral HD cath intact, site clean    Mallampati Score:  MD Evaluation Airway: Other (comments) Airway comments: tracheostomy Heart:  WNL Abdomen: WNL Chest/ Lungs: WNL ASA  Classification: 3 Mallampati/Airway Score:  (tracheostomy)  Imaging: No results found.  Labs:  CBC:  Recent Labs  12/02/15 0854  HGB 12.9  HCT 38.0    COAGS: No results for input(s): INR, APTT in the last 8760 hours.  BMP:  Recent Labs  12/02/15 0854  NA 135  K 3.8  GLUCOSE 117*    Assessment and Plan: ESRD For tunneled IJ HD catheter Labs reviewed, ok Discussed with family, consent obtained.  Thank you for this interesting consult.  I greatly enjoyed meeting [name] and look forward to participating in their care.  A copy of this report was sent to the requesting provider on this date.  SignedBrayton El 12/07/2015, 8:09 AM   I spent a total of 15 minutes in face to face in clinical consultation, greater than 50% of which was counseling/coordinating care for HD catheter placement

## 2015-12-07 NOTE — Procedures (Signed)
Interventional Radiology Procedure Note  Procedure:  Left femoral tunneled HD catheter placement  Complications:  None  Estimated Blood Loss: < 25 mL  Findings:  Left IJ cannulated.  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 44 cm tip to cuff length Palindrome HD cath placed via left common femoral vein.  Tip in upper IVC.  Jodi MarbleGlenn T. Fredia SorrowYamagata, M.D Pager:  [phone]

## 2015-12-28 DEATH — deceased
